# Patient Record
Sex: Female | Born: 1990 | Race: White | Hispanic: No | Marital: Married | Smoking: Never smoker
Health system: Southern US, Community
[De-identification: ages and names within clinical notes are randomized; demographics above are authoritative.]

## PROBLEM LIST (undated history)

## (undated) DIAGNOSIS — T7840XA Allergy, unspecified, initial encounter: Secondary | ICD-10-CM

## (undated) DIAGNOSIS — J309 Allergic rhinitis, unspecified: Secondary | ICD-10-CM

## (undated) DIAGNOSIS — C539 Malignant neoplasm of cervix uteri, unspecified: Secondary | ICD-10-CM

## (undated) DIAGNOSIS — F419 Anxiety disorder, unspecified: Secondary | ICD-10-CM

## (undated) HISTORY — DX: Anxiety disorder, unspecified: F41.9

## (undated) HISTORY — DX: Allergy, unspecified, initial encounter: T78.40XA

## (undated) HISTORY — PX: WISDOM TOOTH EXTRACTION: SHX21

## (undated) HISTORY — DX: Malignant neoplasm of cervix uteri, unspecified: C53.9

## (undated) HISTORY — PX: TONSILLECTOMY: SUR1361

## (undated) HISTORY — PX: TONSILECTOMY, ADENOIDECTOMY, BILATERAL MYRINGOTOMY AND TUBES: SHX2538

---
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
## 2014-07-29 ENCOUNTER — Ambulatory Visit
Admission: EM | Admit: 2014-07-29 | Discharge: 2014-07-29 | Disposition: A | Discharge status: Home / Self Care | Payer: Self-pay | Attending: Family Medicine | Admitting: Family Medicine

## 2014-07-29 DIAGNOSIS — L259 Unspecified contact dermatitis, unspecified cause: Secondary | ICD-10-CM

## 2014-07-29 MED ORDER — CETIRIZINE HCL 10 MG PO TABS: 10.0000 mg | ORAL_TABLET | Freq: Every day | ORAL | Status: DC

## 2014-07-29 MED ORDER — PREDNISONE 5 MG PO TABS: 5.0000 mg | ORAL_TABLET | Freq: Every day | ORAL | Status: DC

## 2014-07-29 MED ORDER — RANITIDINE HCL 150 MG PO TABS: 150.0000 mg | ORAL_TABLET | Freq: Two times a day (BID) | ORAL | Status: DC

## 2014-07-29 MED ORDER — PREDNISONE 20 MG PO TABS: 40.0000 mg | ORAL_TABLET | Freq: Every day | ORAL | Status: DC

## 2014-07-29 NOTE — ED Provider Notes (Signed)
CSN: [id number]     Arrival date & time 07/29/14  0902 History   First MD Initiated Contact with Patient 07/29/14 0913     Chief Complaint  Patient presents with  . Pruritis   (Consider location/radiation/quality/duration/timing/severity/associated sxs/prior Treatment) HPI Comments: Caucasian female with history of sensitive skin to perfumes/cleansers.  Washing machine broke last week had to use laundrymat until able to buy new washer.  Continued use of her usual products fragrance free but having body wide itching.  Pets at home denied fleas-bets on treatment, denied insect bites, yard word.  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  No itching relief with benadryl OTC, showering, moisturizing lotion.  The history is provided by the patient and a relative.    History reviewed. No pertinent past medical history. History reviewed. No pertinent past surgical history. History reviewed. No pertinent family history. History  Substance Use Topics  . Smoking status: Never Smoker   . Smokeless tobacco: Never Used  . Alcohol Use: Yes     Comment: occasional   OB History    No data available     Review of Systems  Constitutional: Negative for fever, chills, diaphoresis, activity change, appetite change and fatigue.  HENT: Negative for facial swelling, mouth sores, nosebleeds, trouble swallowing and voice change.   Eyes: Negative for photophobia, pain, discharge, redness and itching.  Respiratory: Negative for apnea, cough, choking, chest tightness, shortness of breath, wheezing and stridor.   Cardiovascular: Negative for chest pain, palpitations and leg swelling.  Gastrointestinal: Negative for nausea, vomiting, diarrhea and constipation.  Endocrine: Negative for cold intolerance and heat intolerance.  Genitourinary: Negative for frequency, hematuria and difficulty urinating.  Musculoskeletal: Negative for myalgias, back pain, joint swelling, arthralgias, gait problem, neck pain and neck  stiffness.  Skin: Positive for rash. Negative for color change, pallor and wound.  Allergic/Immunologic: Positive for environmental allergies. Negative for food allergies.  Neurological: Negative for dizziness, tremors, seizures, syncope, facial asymmetry, speech difficulty, weakness, light-headedness, numbness and headaches.  Hematological: Negative for adenopathy. Does not bruise/bleed easily.  Psychiatric/Behavioral: Negative for behavioral problems, confusion, sleep disturbance and agitation.    Allergies  Review of patient's allergies indicates no known allergies.  Home Medications   Prior to Admission medications   Medication Sig Start Date End Date Taking? Authorizing Provider  cetirizine (ZYRTEC) 10 MG tablet Take 1 tablet (10 mg total) by mouth daily. 07/29/14   Olen Cordial, NP  predniSONE (DELTASONE) 20 MG tablet Take 2 tablets (40 mg total) by mouth daily. For 7 days then 20mg  (1 tab) po daily x 7 days then 10mg  (1/2 tab) for 4 days 07/29/14   Olen Cordial, NP  predniSONE (DELTASONE) 5 MG tablet Take 1 tablet (5 mg total) by mouth daily with breakfast. 07/29/14   Olen Cordial, NP  ranitidine (ZANTAC) 150 MG tablet Take 1 tablet (150 mg total) by mouth 2 (two) times daily. 07/29/14   Aura Fey Itzamar Traynor, NP   BP 122/75 mmHg  Pulse 60  Temp(Src) 98 F (36.7 C) (Tympanic)  Resp 20  Ht 5\' 6"  (1.676 m)  Wt 170 lb (77.111 kg)  BMI 27.45 kg/m2  SpO2 98% Physical Exam  Constitutional: She is oriented to person, place, and time. Vital signs are normal. She appears well-developed and well-nourished. No distress.  HENT:  Head: Normocephalic and atraumatic.  Right Ear: External ear normal.  Left Ear: External ear normal.  Nose: Nose normal.  Mouth/Throat: Oropharynx is clear and moist. No oropharyngeal  exudate.  Eyes: Conjunctivae, EOM and lids are normal. Pupils are equal, round, and reactive to light. Right eye exhibits no discharge. Left eye exhibits no discharge. No  scleral icterus.  Neck: Trachea normal and normal range of motion. Neck supple. No tracheal deviation present. No thyromegaly present.  Cardiovascular: Normal rate, regular rhythm, normal heart sounds and intact distal pulses.  Exam reveals no gallop and no friction rub.   No murmur heard. Pulmonary/Chest: Effort normal and breath sounds normal. No respiratory distress. She has no wheezes. She has no rales. She exhibits no tenderness.  Abdominal: Soft. Bowel sounds are normal. She exhibits no distension and no mass. There is no tenderness. There is no rebound and no guarding.  Musculoskeletal: Normal range of motion. She exhibits no edema or tenderness.  Lymphadenopathy:    She has no cervical adenopathy.  Neurological: She is alert and oriented to person, place, and time. Coordination normal.  Skin: Skin is warm and dry. Abrasion, lesion and rash noted. No bruising, no burn, no ecchymosis, no laceration, no petechiae and no purpura noted. Rash is papular. Rash is not macular, not maculopapular, not nodular, not pustular, not vesicular and not urticarial. She is not diaphoretic. No cyanosis or erythema. No pallor. Nails show no clubbing.  All extremities and torso with nonerythematous fine papules (milia appearance) pruritis patient continual scratching over clothes or bare skin during H&P  Psychiatric: She has a normal mood and affect. Her speech is normal and behavior is normal. Judgment and thought content normal. Cognition and memory are normal.  Nursing note and vitals reviewed.   ED Course  Procedures (including critical care time) Labs Review Labs Reviewed - No data to display  Imaging Review No results found.   MDM   1. Contact dermatitis    Washer broke and had to use laundry mat this past week bought new washer and resumed home laundry.  Typically sensitive skin uses nonfragrance/sensitive cleansers and personal hygiene products.  Widespread pruritis and nonerythematous papules  1-6mm diameter greater than 20% body surface area arms/legs/torso.  Patient scratching during H&P.  1-2mg /kg Prednisone (max 60mg ) for 7-10 days and taper over next 7-10 days per Up to Date.  Symptomatic therapy suggested e.g. Calamine lotion, benadryl or OTC zyrtec.  Warm to cool water soaks and/or oatmeal baths.  Call or return to clinic as needed if these symptoms worsen or fail to improve as anticipated especially lesions noted on eye, visual changes or visual loss.  Avoid scratching lesions to prevent secondary infections.  May apply ice to itchy areas if po/topical meds not yet active systemically or wearing off prior to next dose.  Exitcare handout on contact dermatitis given to patient and mother.  Patient and mother verbalized agreement and understanding of treatment plan and had no further questions at this time.   P2:  Avoidance and hand washing.    Olen Cordial, NP 07/29/14 1824

## 2014-07-29 NOTE — ED Notes (Signed)
Patient reports itching to arms, legs for the past week, no real rash noted. Tried OTC benadryl to help but no relief. Has tried to change soaps and detergents.

## 2014-07-29 NOTE — Discharge Instructions (Signed)

## 2014-08-15 ENCOUNTER — Emergency Department
Admission: EM | Admit: 2014-08-15 | Discharge: 2014-08-15 | Disposition: A | Discharge status: Home / Self Care | Payer: Self-pay | Attending: Emergency Medicine | Admitting: Emergency Medicine

## 2014-08-15 ENCOUNTER — Encounter: Payer: Self-pay | Admitting: Emergency Medicine

## 2014-08-15 DIAGNOSIS — L299 Pruritus, unspecified: Secondary | ICD-10-CM | POA: Insufficient documentation

## 2014-08-15 DIAGNOSIS — Z7952 Long term (current) use of systemic steroids: Secondary | ICD-10-CM | POA: Insufficient documentation

## 2014-08-15 DIAGNOSIS — Z79899 Other long term (current) drug therapy: Secondary | ICD-10-CM | POA: Insufficient documentation

## 2014-08-15 MED ORDER — HYDROXYZINE HCL 50 MG PO TABS
ORAL_TABLET | ORAL | Status: AC
  Administered 2014-08-15: 50 mg via ORAL
  Filled 2014-08-15: qty 1

## 2014-08-15 MED ORDER — HYDROXYZINE HCL 50 MG PO TABS
50.0000 mg | ORAL_TABLET | Freq: Once | ORAL | Status: AC
  Administered 2014-08-15: 50 mg via ORAL

## 2014-08-15 MED ORDER — HYDROXYZINE HCL 50 MG PO TABS: 50.0000 mg | ORAL_TABLET | Freq: Three times a day (TID) | ORAL | Status: DC | PRN

## 2014-08-15 NOTE — ED Notes (Signed)
Patient presents to the ED with itching x 2 weeks to legs, chest and back.  Patient seen at urgent care and has been taking prednisone for several days with minimal relief.  Patient reports no noticeable rash.  Patient states she changed body wash for a short time but states she changed back about 1 week ago.

## 2014-08-15 NOTE — ED Provider Notes (Signed)
[facility] Emergency Department Provider Note  ____________________________________________  Time seen: Approximately 12:35 PM  I have reviewed the triage vital signs and the nursing notes.   HISTORY  Chief Complaint Pruritis    HPI Dawn Camacho is a 24 y.o. female complaining of itching without a rash for 2 weeks. Patient has a generalized body itching for 2 weeks. Patient was seen at urgent care clinic a week ago and was given prednisone which state helps was still continues  with itching. She states she is change all provocative items personal hygiene products  to find the source of the itching.Patient denies any pain.   History reviewed. No pertinent past medical history.  There are no active problems to display for this patient.   History reviewed. No pertinent past surgical history.  Current Outpatient Rx  Name  Route  Sig  Dispense  Refill  . cetirizine (ZYRTEC) 10 MG tablet   Oral   Take 1 tablet (10 mg total) by mouth daily.   14 tablet      . hydrOXYzine (ATARAX/VISTARIL) 50 MG tablet   Oral   Take 1 tablet (50 mg total) by mouth 3 (three) times daily as needed.   30 tablet   0   . predniSONE (DELTASONE) 20 MG tablet   Oral   Take 2 tablets (40 mg total) by mouth daily. For 7 days then 20mg  (1 tab) po daily x 7 days then 10mg  (1/2 tab) for 4 days   23 tablet   0   . predniSONE (DELTASONE) 5 MG tablet   Oral   Take 1 tablet (5 mg total) by mouth daily with breakfast.   3 tablet   0   . ranitidine (ZANTAC) 150 MG tablet   Oral   Take 1 tablet (150 mg total) by mouth 2 (two) times daily.   28 tablet   0     Allergies Review of patient's allergies indicates no known allergies.  History reviewed. No pertinent family history.  Social History History  Substance Use Topics  . Smoking status: Never Smoker   . Smokeless tobacco: Never Used  . Alcohol Use: Yes     Comment: occasional    Review of  Systems Constitutional: No fever/chills Eyes: No visual changes. ENT: No sore throat. Cardiovascular: Denies chest pain. Respiratory: Denies shortness of breath. Gastrointestinal: No abdominal pain.  No nausea, no vomiting.  No diarrhea.  No constipation. Genitourinary: Negative for dysuria. Musculoskeletal: Negative for back pain. Skin: Negative for rash. Neurological: Negative for headaches, focal weakness or numbness. Endocrine: Hematological/Lymphatic: 10-point ROS otherwise negative.  ____________________________________________   PHYSICAL EXAM:  VITAL SIGNS: ED Triage Vitals  Enc Vitals Group     BP 08/15/14 1205 131/84 mmHg     Pulse Rate 08/15/14 1205 63     Resp 08/15/14 1205 20     Temp 08/15/14 1205 98.4 F (36.9 C)     Temp Source 08/15/14 1205 Oral     SpO2 08/15/14 1205 98 %     Weight 08/15/14 1205 180 lb (81.647 kg)     Height 08/15/14 1205 5\' 6"  (1.676 m)     Head Cir --      Peak Flow --      Pain Score 08/15/14 1205 0     Pain Loc --      Pain Edu? --      Excl. in [place]? --     Constitutional: Alert and oriented. Well appearing and  in no acute distress. Eyes: Conjunctivae are normal. PERRL. EOMI. Head: Atraumatic. Nose: No congestion/rhinnorhea. Mouth/Throat: Mucous membranes are moist.  Oropharynx non-erythematous. Neck: No stridor.  Hematological/Lymphatic/Immunilogical: No cervical lymphadenopathy. Cardiovascular: Normal rate, regular rhythm. Grossly normal heart sounds.  Good peripheral circulation. Respiratory: Normal respiratory effort.  No retractions. Lungs CTAB. Gastrointestinal: Soft and nontender. No distention. No abdominal bruits. No CVA tenderness. Genitourinary:  Musculoskeletal: No lower extremity tenderness nor edema.  No joint effusions. Neurologic:  Normal speech and language. No gross focal neurologic deficits are appreciated. Speech is normal. No gait instability. Skin:  Skin is warm, dry and intact. No rash  noted. Psychiatric: Mood and affect are normal. Speech and behavior are normal.  ____________________________________________   LABS (all labs ordered are listed, but only abnormal results are displayed)  Labs Reviewed - No data to display ____________________________________________  EKG   ____________________________________________  RADIOLOGY   ____________________________________________   PROCEDURES  Procedure(s) performed: None  Critical Care performed: No  ____________________________________________   INITIAL IMPRESSION / ASSESSMENT AND PLAN / ED COURSE  Pertinent labs & imaging results that were available during my care of the patient were reviewed by me and considered in my medical decision making (see chart for details).  Pruritus. Advised patient to continue prednisone as directed. Start Atarax 50 mg 3 times a day when necessary itching. Patient advised to follow-up with dermatology for definitive evaluation and treatment. ____________________________________________   FINAL CLINICAL IMPRESSION(S) / ED DIAGNOSES  Final diagnoses:  Chronic pruritus      Sable Feil, PA-C 08/15/14 [place], PA-C 08/15/14 1254  Ahmed Prima, MD 08/15/14 [phone]

## 2014-08-22 ENCOUNTER — Ambulatory Visit
Admission: EM | Admit: 2014-08-22 | Discharge: 2014-08-22 | Disposition: A | Discharge status: Home / Self Care | Payer: Self-pay | Attending: Internal Medicine | Admitting: Internal Medicine

## 2014-08-22 DIAGNOSIS — B86 Scabies: Secondary | ICD-10-CM

## 2014-08-22 MED ORDER — PERMETHRIN 5 % EX CREA: TOPICAL_CREAM | CUTANEOUS | Status: DC

## 2014-08-22 MED ORDER — PREDNISONE 20 MG PO TABS: 60.0000 mg | ORAL_TABLET | Freq: Every day | ORAL | Status: DC

## 2014-08-22 MED ORDER — PREDNISONE 10 MG PO TABS: 60.0000 mg | ORAL_TABLET | Freq: Every day | ORAL | Status: DC

## 2014-08-22 NOTE — Discharge Instructions (Signed)
Prescriptions for prednisone and elimite.  Scabies Scabies are small bugs (mites) that burrow under the skin and cause red bumps and severe itching. These bugs can only be seen with a microscope. Scabies are highly contagious. They can spread easily from person to person by direct contact. They are also spread through sharing clothing or linens that have the scabies mites living in them. It is not unusual for an entire family to become infected through shared towels, clothing, or bedding.  HOME CARE INSTRUCTIONS   Your caregiver may prescribe a cream or lotion to kill the mites. If cream is prescribed, massage the cream into the entire body from the neck to the bottom of both feet. Also massage the cream into the scalp and face if your child is less than 71 year old. Avoid the eyes and mouth. Do not wash your hands after application.  Leave the cream on for 8 to 12 hours. Your child should bathe or shower after the 8 to 12 hour application period. Sometimes it is helpful to apply the cream to your child right before bedtime.  One treatment is usually effective and will eliminate approximately 95% of infestations. For severe cases, your caregiver may decide to repeat the treatment in 1 week. Everyone in your household should be treated with one application of the cream.  New rashes or burrows should not appear within 24 to 48 hours after successful treatment. However, the itching and rash may last for 2 to 4 weeks after successful treatment. Your caregiver may prescribe a medicine to help with the itching or to help the rash go away more quickly.  Scabies can live on clothing or linens for up to 3 days. All of your child's recently used clothing, towels, stuffed toys, and bed linens should be washed in hot water and then dried in a dryer for at least 20 minutes on high heat. Items that cannot be washed should be enclosed in a plastic bag for at least 3 days.  To help relieve itching, bathe your child  in a cool bath or apply cool washcloths to the affected areas.  Your child may return to school after treatment with the prescribed cream. SEEK MEDICAL CARE IF:   The itching persists longer than 4 weeks after treatment.  The rash spreads or becomes infected. Signs of infection include red blisters or yellow-tan crust. Document Released: 02/09/2005 Document Revised: 05/04/2011 Document Reviewed: 06/20/2008 [facility] Patient Information 2015 [place], [place]. This information is not intended to replace advice given to you by your health care provider. Make sure you discuss any questions you have with your health care provider.

## 2014-08-22 NOTE — ED Notes (Signed)
Pt states "I was seen here a few weeks ago with itching, and it continues. I completed the Prednisone course and was getting better, but now the itching is worse again."

## 2014-08-23 NOTE — ED Provider Notes (Signed)
CSN: [id number]     Arrival date & time 08/22/14  1827 History   First MD Initiated Contact with Patient 08/22/14 1929     Chief Complaint  Patient presents with  . Pruritis   HPI   Severe itching, with nondescript papular rash.  Very itchy in waistline, in groins, under buttocks, at wrists, but whole body itches.  Has tried changing laundry products, personal care products, but no relief. No itchy contacts.  No prior hx this symptom.  History reviewed. No pertinent past medical history. History reviewed. No pertinent past surgical history. No family history on file. History  Substance Use Topics  . Smoking status: Never Smoker   . Smokeless tobacco: Never Used  . Alcohol Use: Yes     Comment: occasional   OB History    No data available     Review of Systems  All other systems reviewed and are negative.   Allergies  Review of patient's allergies indicates no known allergies.  Home Medications patient takes no medications regularly.  Prednisone and hydroxyzine prescribed recently helped some with the itching.    BP 117/66 mmHg  Pulse 70  Temp(Src) 97.4 F (36.3 C) (Tympanic)  Ht 5\' 6"  (1.676 m)  Wt 180 lb (81.647 kg)  BMI 29.07 kg/m2  SpO2 100%  LMP 08/02/2014 (Exact Date) Physical Exam  Constitutional: She is oriented to person, place, and time. No distress.  Alert, nicely groomed  HENT:  Head: Atraumatic.  Eyes:  Conjugate gaze, no eye redness/drainage  Neck: Neck supple.  Cardiovascular: Normal rate.   Pulmonary/Chest: No respiratory distress.  Abdominal: She exhibits no distension.  Musculoskeletal: Normal range of motion.  No leg swelling  Neurological: She is alert and oriented to person, place, and time.  Skin: Skin is warm and dry.  No cyanosis Scattered red papules over arms/legs/torso.  A few excoriations from scratching.    Nursing note and vitals reviewed.   ED Course  Procedures  No procedures at the urgent care today.  MDM   1.  Scabies    rx elimite lotion and prednisone taper.  Discussed environmental measures, wash in hot water or put away for several days clothing/bedding/towels.  Anticipate slow improvement in itching over the next several days to weeks.  Recheck as needed.    Sherlene Shams, MD 08/23/14 2236

## 2015-01-27 ENCOUNTER — Ambulatory Visit
Admission: EM | Admit: 2015-01-27 | Discharge: 2015-01-27 | Disposition: A | Discharge status: Home / Self Care | Payer: Self-pay | Attending: Internal Medicine | Admitting: Internal Medicine

## 2015-01-27 DIAGNOSIS — H6502 Acute serous otitis media, left ear: Secondary | ICD-10-CM

## 2015-01-27 DIAGNOSIS — J0100 Acute maxillary sinusitis, unspecified: Secondary | ICD-10-CM

## 2015-01-27 MED ORDER — LORATADINE-PSEUDOEPHEDRINE ER 5-120 MG PO TB12: 1.0000 | ORAL_TABLET | Freq: Two times a day (BID) | ORAL | Status: AC

## 2015-01-27 MED ORDER — AMOXICILLIN-POT CLAVULANATE 875-125 MG PO TABS: 1.0000 | ORAL_TABLET | Freq: Two times a day (BID) | ORAL | Status: DC

## 2015-01-27 NOTE — ED Notes (Signed)
X "couple days". Facial pain, H/A, nasal congestion, left ear fullness, decreased hearing in left ear.

## 2015-01-27 NOTE — Discharge Instructions (Signed)
Take medication as prescribed. Rest. Drink and eat regularly.   Follow up with your primary care physician this week as needed. Return to Urgent care as needed for new or worsening concerns.   Otitis Media With Effusion Otitis media with effusion is the presence of fluid in the middle ear. This is a common problem in children, which often follows ear infections. It may be present for weeks or longer after the infection. Unlike an acute ear infection, otitis media with effusion refers only to fluid behind the ear drum and not infection. Children with repeated ear and sinus infections and allergy problems are the most likely to get otitis media with effusion. CAUSES  The most frequent cause of the fluid buildup is dysfunction of the eustachian tubes. These are the tubes that drain fluid in the ears to the back of the nose (nasopharynx). SYMPTOMS   The main symptom of this condition is hearing loss. As a result, you or your child may:  Listen to the TV at a loud volume.  Not respond to questions.  Ask "what" often when spoken to.  Mistake or confuse one sound or word for another.  There may be a sensation of fullness or pressure but usually not pain. DIAGNOSIS   Your health care provider will diagnose this condition by examining you or your child's ears.  Your health care provider may test the pressure in you or your child's ear with a tympanometer.  A hearing test may be conducted if the problem persists. TREATMENT   Treatment depends on the duration and the effects of the effusion.  Antibiotics, decongestants, nose drops, and cortisone-type drugs (tablets or nasal spray) may not be helpful.  Children with persistent ear effusions may have delayed language or behavioral problems. Children at risk for developmental delays in hearing, learning, and speech may require referral to a specialist earlier than children not at risk.  You or your child's health care provider may suggest a  referral to an ear, nose, and throat surgeon for treatment. The following may help restore normal hearing:  Drainage of fluid.  Placement of ear tubes (tympanostomy tubes).  Removal of adenoids (adenoidectomy). HOME CARE INSTRUCTIONS   Avoid secondhand smoke.  Infants who are breastfed are less likely to have this condition.  Avoid feeding infants while they are lying flat.  Avoid known environmental allergens.  Avoid people who are sick. SEEK MEDICAL CARE IF:   Hearing is not better in 3 months.  Hearing is worse.  Ear pain.  Drainage from the ear.  Dizziness. MAKE SURE YOU:   Understand these instructions.  Will watch your condition.  Will get help right away if you are not doing well or get worse.   This information is not intended to replace advice given to you by your health care provider. Make sure you discuss any questions you have with your health care provider.   Document Released: 03/19/2004 Document Revised: 03/02/2014 Document Reviewed: 09/06/2012 Elsevier Interactive Patient Education 2016 Elsevier [employer]  Sinusitis, Adult Sinusitis is redness, soreness, and inflammation of the paranasal sinuses. Paranasal sinuses are air pockets within the bones of your face. They are located beneath your eyes, in the middle of your forehead, and above your eyes. In healthy paranasal sinuses, mucus is able to drain out, and air is able to circulate through them by way of your nose. However, when your paranasal sinuses are inflamed, mucus and air can become trapped. This can allow bacteria and other germs to grow  and cause infection. Sinusitis can develop quickly and last only a short time (acute) or continue over a long period (chronic). Sinusitis that lasts for more than 12 weeks is considered chronic. CAUSES Causes of sinusitis include:  Allergies.  Structural abnormalities, such as displacement of the cartilage that separates your nostrils (deviated septum), which can  decrease the air flow through your nose and sinuses and affect sinus drainage.  Functional abnormalities, such as when the small hairs (cilia) that line your sinuses and help remove mucus do not work properly or are not present. SIGNS AND SYMPTOMS Symptoms of acute and chronic sinusitis are the same. The primary symptoms are pain and pressure around the affected sinuses. Other symptoms include:  Upper toothache.  Earache.  Headache.  Bad breath.  Decreased sense of smell and taste.  A cough, which worsens when you are lying flat.  Fatigue.  Fever.  Thick drainage from your nose, which often is green and may contain pus (purulent).  Swelling and warmth over the affected sinuses. DIAGNOSIS Your health care provider will perform a physical exam. During your exam, your health care provider may perform any of the following to help determine if you have acute sinusitis or chronic sinusitis:  Look in your nose for signs of abnormal growths in your nostrils (nasal polyps).  Tap over the affected sinus to check for signs of infection.  View the inside of your sinuses using an imaging device that has a light attached (endoscope). If your health care provider suspects that you have chronic sinusitis, one or more of the following tests may be recommended:  Allergy tests.  Nasal culture. A sample of mucus is taken from your nose, sent to a lab, and screened for bacteria.  Nasal cytology. A sample of mucus is taken from your nose and examined by your health care provider to determine if your sinusitis is related to an allergy. TREATMENT Most cases of acute sinusitis are related to a viral infection and will resolve on their own within 10 days. Sometimes, medicines are prescribed to help relieve symptoms of both acute and chronic sinusitis. These may include pain medicines, decongestants, nasal steroid sprays, or saline sprays. However, for sinusitis related to a bacterial infection, your  health care provider will prescribe antibiotic medicines. These are medicines that will help kill the bacteria causing the infection. Rarely, sinusitis is caused by a fungal infection. In these cases, your health care provider will prescribe antifungal medicine. For some cases of chronic sinusitis, surgery is needed. Generally, these are cases in which sinusitis recurs more than 3 times per year, despite other treatments. HOME CARE INSTRUCTIONS  Drink plenty of water. Water helps thin the mucus so your sinuses can drain more easily.  Use a humidifier.  Inhale steam 3-4 times a day (for example, sit in the bathroom with the shower running).  Apply a warm, moist washcloth to your face 3-4 times a day, or as directed by your health care provider.  Use saline nasal sprays to help moisten and clean your sinuses.  Take medicines only as directed by your health care provider.  If you were prescribed either an antibiotic or antifungal medicine, finish it all even if you start to feel better. SEEK IMMEDIATE MEDICAL CARE IF:  You have increasing pain or severe headaches.  You have nausea, vomiting, or drowsiness.  You have swelling around your face.  You have vision problems.  You have a stiff neck.  You have difficulty breathing.   This  information is not intended to replace advice given to you by your health care provider. Make sure you discuss any questions you have with your health care provider.   Document Released: 02/09/2005 Document Revised: 03/02/2014 Document Reviewed: 02/24/2011 Elsevier Interactive Patient Education [employer].

## 2015-01-27 NOTE — ED Provider Notes (Signed)
[facility]  ____________________________________________  Time seen: Approximately 2:28 PM  I have reviewed the triage vital signs and the nursing notes.   HISTORY  Chief Complaint Sinusitis   HPI Dawn Camacho is a 24 y.o. female  presents for the complaint of 7-8 days of runny nose, nasal congestion, sinus pressure. Also reports gradual onset of left ear discomfort. States left ear feels clogged. States sinus pressure discomfort is 4 out of 10 denies pain radiation. Denies neck pain, headache, dizziness, nausea, vomiting, chest pain or shortness of breath. Denies abdominal pain or fevers. Reports continues to eat and drink well.  Reports she has had back-to-back "colds" over the last few weeks. States nasal congestion never fully resolved. Reports frequently has thick yellowish drainage from nose and blowing nose. States left ear feels clogged and somewhat muffled but denies hearing changes.   No past medical history on file.  There are no active problems to display for this patient.   No past surgical history on file.  LMP: 2 days ago, denies chance of pregnancy.   Current Outpatient Rx  Name  Route  Sig  Dispense  Refill  .           .           .           .             Allergies Review of patient's allergies indicates no known allergies.  No family history on file.  Social History Social History  Substance Use Topics  . Smoking status: Never Smoker   . Smokeless tobacco: Never Used  . Alcohol Use: Yes     Comment: occasional    Review of Systems Constitutional: No fever/chills Eyes: No visual changes. ENT: No sore throat. positive runny nose, nasal congestion, sinus drainage. Reports occasional cough.  Cardiovascular: Denies chest pain. Respiratory: Denies shortness of breath. Gastrointestinal: No abdominal pain.  No nausea, no vomiting.  No diarrhea.  No constipation. Genitourinary: Negative for dysuria. Musculoskeletal: Negative for  back pain. Skin: Negative for rash. Neurological: Negative for headaches, focal weakness or numbness.  10-point ROS otherwise negative.  ____________________________________________   PHYSICAL EXAM:  VITAL SIGNS: ED Triage Vitals  Enc Vitals Group     BP 01/27/15 1400 124/63 mmHg     Pulse Rate 01/27/15 1400 69     Resp 01/27/15 1400 16     Temp 01/27/15 1400 98 F (36.7 C)     Temp Source 01/27/15 1400 Oral     SpO2 01/27/15 1400 100 %     Weight 01/27/15 1400 189 lb (85.73 kg)     Height 01/27/15 1400 5\' 6"  (1.676 m)     Head Cir --      Peak Flow --      Pain Score 01/27/15 1402 4     Pain Loc --      Pain Edu? --      Excl. in [place]? --     Constitutional: Alert and oriented. Well appearing and in no acute distress. Eyes: Conjunctivae are normal. PERRL. EOMI. Head: Atraumatic.Moderate tenderness to palpation bilateral maxillary sinuses. Minimal tenderness to palpation bilateral frontal sinuses. No swelling. No erythema.   Ears:  right : No erythema, normal TM. Left : Mild erythema with positive air-fluid levels seen TM , TM intact , no exudate or drainage. Minimal tenderness to palpation. Hearing grossly intact bilaterally.   Nose:  nasal congestion bilateral nasal turbinate erythema  and edema. ,   Mouth/Throat: Mucous membranes are moist. Minimal pharyngeal erythema. No tonsillar swelling or exudate. Neck: No stridor.  No cervical spine tenderness to palpation. Hematological/Lymphatic/Immunilogical: No cervical lymphadenopathy. Cardiovascular: Normal rate, regular rhythm. Grossly normal heart sounds.  Good peripheral circulation. Respiratory: Normal respiratory effort.  No retractions. Lungs CTAB. Gastrointestinal: Soft and nontender. No distention. Normal Bowel sounds. Musculoskeletal: No lower or upper extremity tenderness nor edema.  No joint effusions. Bilateral pedal pulses equal and easily palpated.  Neurologic:  Normal speech and language. No gross focal neurologic  deficits are appreciated. No gait instability. Skin:  Skin is warm, dry and intact. No rash noted. Psychiatric: Mood and affect are normal. Speech and behavior are normal.  ____________________________________________   LABS (all labs ordered are listed, but only abnormal results are displayed)  Labs Reviewed - No data to display  INITIAL IMPRESSION / ASSESSMENT AND PLAN / ED COURSE  Pertinent labs & imaging results that were available during my care of the patient were reviewed by me and considered in my medical decision making (see chart for details).  Very well-appearing patient. No acute distress. Presents for complaints of 1 week of runny nose, nasal congestion, nasal drainage and sinus pressure. Also reports times few days of left ear discomfort and feeling like left ear is clogged. Lungs clear throughout. Very well-appearing patient. Suspect maxillary sinusitis with left otitis media with effusion. Encouraged rest, fluids over-the-counter, or ibuprofen as needed. Encouraged Claritin-D and will treat with oral Augmentin.  Discussed follow up with Primary care physician this week. Discussed follow up and return parameters including no resolution or any worsening concerns. Patient verbalized understanding and agreed to plan.   ____________________________________________   FINAL CLINICAL IMPRESSION(S) / ED DIAGNOSES  Final diagnoses:  Acute maxillary sinusitis, recurrence not specified  Acute serous otitis media of left ear, recurrence not specified       Marylene Land, NP 01/27/15 [phone]

## 2015-11-21 ENCOUNTER — Ambulatory Visit
Admission: EM | Admit: 2015-11-21 | Discharge: 2015-11-21 | Disposition: A | Discharge status: Home / Self Care | Payer: Self-pay | Attending: Family Medicine | Admitting: Family Medicine

## 2015-11-21 DIAGNOSIS — L301 Dyshidrosis [pompholyx]: Secondary | ICD-10-CM | POA: Diagnosis present

## 2015-11-21 HISTORY — DX: Allergic rhinitis, unspecified: J30.9

## 2015-11-21 MED ORDER — CLOBETASOL PROPIONATE 0.05 % EX CREA: 1.0000 "application " | TOPICAL_CREAM | Freq: Two times a day (BID) | CUTANEOUS | 0 refills | Status: DC

## 2015-11-21 NOTE — ED Provider Notes (Signed)
[facility]    CSN: [id number] Arrival date & time: 11/21/15  1210     History   Chief Complaint Chief Complaint  Patient presents with  . Pruritis    HPI Dawn Camacho is a 25 y.o. female.   Patient's here because of rash between her fingers. Back in June she has scabies infection start on the hands that spread all over her body. This time his stress has been on her hands and fingers. This time the rash has not occurred over her arms and legs and other parts of her body. This rash started about 2-3 days ago   The history is provided by the patient. No language interpreter was used.  Rash  Location:  Finger and hand Hand rash location:  L hand, R hand, L fingers and R fingers Quality: itchiness   Severity:  Moderate Onset quality:  Sudden Duration:  3 days Timing:  Constant Progression:  Worsening Chronicity:  New Context: not animal contact, not chemical exposure, not diapers, not eggs, not hot tub use, not insect bite/sting, not medications, not new detergent/soap, not plant contact and not pollen   Relieved by:  Nothing Ineffective treatments:  None tried   Past Medical History:  Diagnosis Date  . Allergic rhinitis     Patient Active Problem List   Diagnosis Date Noted  . Dyshydrosis 11/21/2015    Past Surgical History:  Procedure Laterality Date  . TONSILLECTOMY      OB History    No data available       Home Medications    Prior to Admission medications   Medication Sig Start Date End Date Taking? Authorizing Provider  amoxicillin-clavulanate (AUGMENTIN) 875-125 MG tablet Take 1 tablet by mouth every 12 (twelve) hours. 01/27/15   Marylene Land, NP  cetirizine (ZYRTEC) 10 MG tablet Take 1 tablet (10 mg total) by mouth daily. 07/29/14   Olen Cordial, NP  clobetasol cream (TEMOVATE) AB-123456789 % Apply 1 application topically 2 (two) times daily. 11/21/15   Frederich Cha, MD  hydrOXYzine (ATARAX/VISTARIL) 50 MG tablet Take 1 tablet (50 mg  total) by mouth 3 (three) times daily as needed. 08/15/14   Sable Feil, PA-C  permethrin (ELIMITE) 5 % cream Apply to affected area once from the neck down, rub in thoroughly 08/22/14   Sherlene Shams, MD  predniSONE (DELTASONE) 20 MG tablet Take 3 tablets (60 mg total) by mouth daily. For 3 days, then 2 tablets daily for 3 days then 1 tablet daily for 3 days then 1/2 tablet daily for 4 days. 08/22/14   Sherlene Shams, MD    Family History History reviewed. No pertinent family history.  Social History Social History  Substance Use Topics  . Smoking status: Never Smoker  . Smokeless tobacco: Never Used  . Alcohol use Yes     Comment: occasional     Allergies   Review of patient's allergies indicates no known allergies.   Review of Systems Review of Systems  Skin: Positive for rash.  All other systems reviewed and are negative.    Physical Exam Triage Vital Signs ED Triage Vitals  Enc Vitals Group     BP 11/21/15 1304 122/80     Pulse Rate 11/21/15 1304 69     Resp 11/21/15 1304 20     Temp 11/21/15 1304 98.1 F (36.7 C)     Temp Source 11/21/15 1304 Oral     SpO2 11/21/15 1304 100 %  Weight 11/21/15 1305 190 lb (86.2 kg)     Height 11/21/15 1305 5' 6.5" (1.689 m)     Head Circumference --      Peak Flow --      Pain Score --      Pain Loc --      Pain Edu? --      Excl. in [place]? --    No data found.   Updated Vital Signs BP 122/80 (BP Location: Left Arm)   Pulse 69   Temp 98.1 F (36.7 C) (Oral)   Resp 20   Ht 5' 6.5" (1.689 m)   Wt 190 lb (86.2 kg)   LMP 10/25/2015   SpO2 100%   BMI 30.21 kg/m   Visual Acuity Right Eye Distance:   Left Eye Distance:   Bilateral Distance:    Right Eye Near:   Left Eye Near:    Bilateral Near:     Physical Exam  Constitutional: She appears well-developed and well-nourished.  HENT:  Head: Normocephalic and atraumatic.  Eyes: Pupils are equal, round, and reactive to light.  Neck: Normal range of motion.    Pulmonary/Chest: Effort normal.  Musculoskeletal: Normal range of motion.  Neurological: She is alert.  Skin: Skin is warm. Rash noted.  Rash some of fingers around the size the finger.-like lesions consistent with dyshidrosis and not with recurrent scabies infection  Psychiatric: She has a normal mood and affect.  Vitals reviewed.    UC Treatments / Results  Labs (all labs ordered are listed, but only abnormal results are displayed) Labs Reviewed - No data to display  EKG  EKG Interpretation None       Radiology No results found.  Procedures Procedures (including critical care time)  Medications Ordered in UC Medications - No data to display   Initial Impression / Assessment and Plan / UC Course  I have reviewed the triage vital signs and the nursing notes.  Pertinent labs & imaging results that were available during my care of the patient were reviewed by me and considered in my medical decision making (see chart for details).  Clinical Course   Patient will be treated for dyshidrosis with clobestal cream applied twice a day for the next week follow up here or with her PCP if not better in 1-2 weeks   Final Clinical Impressions(s) / UC Diagnoses   Final diagnoses:  Dyshydrosis    New Prescriptions New Prescriptions   CLOBETASOL CREAM (TEMOVATE) 0.05 %    Apply 1 application topically 2 (two) times daily.     Frederich Cha, MD 11/21/15 [phone]

## 2015-11-21 NOTE — ED Triage Notes (Signed)
Pt reports diagnosed with scabies June 2016. Feels the same itching now only to her hands x past few weeks

## 2016-03-23 ENCOUNTER — Encounter: Payer: Self-pay | Admitting: Emergency Medicine

## 2016-03-23 ENCOUNTER — Ambulatory Visit
Admission: EM | Admit: 2016-03-23 | Discharge: 2016-03-23 | Disposition: A | Discharge status: Home / Self Care | Payer: BLUE CROSS/BLUE SHIELD | Attending: Family Medicine | Admitting: Family Medicine

## 2016-03-23 DIAGNOSIS — J0100 Acute maxillary sinusitis, unspecified: Secondary | ICD-10-CM

## 2016-03-23 MED ORDER — FEXOFENADINE-PSEUDOEPHED ER 60-120 MG PO TB12: 1.0000 | ORAL_TABLET | Freq: Two times a day (BID) | ORAL | 0 refills | Status: DC

## 2016-03-23 MED ORDER — FLUTICASONE PROPIONATE 50 MCG/ACT NA SUSP: 2.0000 | Freq: Every day | NASAL | 0 refills | Status: DC

## 2016-03-23 NOTE — ED Triage Notes (Signed)
Patient c/o sinus congestion and sinus pressure and HAs that started Saturday.  Patient denies fevers.

## 2016-03-23 NOTE — ED Provider Notes (Signed)
CSN: [id number]     Arrival date & time 03/23/16  1123 History   First MD Initiated Contact with Patient 03/23/16 1252     Chief Complaint  Patient presents with  . Facial Pain   (Consider location/radiation/quality/duration/timing/severity/associated sxs/prior Treatment) HPI  26 year old female that presents with sinus congestion sinus pressure and headaches Saturday. She denies any fevers. Most of her  pain is over her maxillary sinuses.       Past Medical History:  Diagnosis Date  . Allergic rhinitis    Past Surgical History:  Procedure Laterality Date  . TONSILLECTOMY     History reviewed. No pertinent family history. Social History  Substance Use Topics  . Smoking status: Never Smoker  . Smokeless tobacco: Never Used  . Alcohol use Yes     Comment: occasional   OB History    No data available     Review of Systems  Constitutional: Positive for activity change. Negative for chills, fatigue and fever.  HENT: Positive for congestion, postnasal drip, rhinorrhea, sinus pain and sinus pressure.   Respiratory: Negative for cough, shortness of breath, wheezing and stridor.   All other systems reviewed and are negative.   Allergies  Patient has no known allergies.  Home Medications   Prior to Admission medications   Medication Sig Start Date End Date Taking? Authorizing Provider  cetirizine (ZYRTEC) 10 MG tablet Take 1 tablet (10 mg total) by mouth daily. 07/29/14   Olen Cordial, NP  clobetasol cream (TEMOVATE) AB-123456789 % Apply 1 application topically 2 (two) times daily. 11/21/15   Frederich Cha, MD  fexofenadine-pseudoephedrine (ALLEGRA-D) 60-120 MG 12 hr tablet Take 1 tablet by mouth every 12 (twelve) hours. 03/23/16   Lorin Picket, PA-C  fluticasone (FLONASE) 50 MCG/ACT nasal spray Place 2 sprays into both nostrils daily. 03/23/16   Lorin Picket, PA-C  hydrOXYzine (ATARAX/VISTARIL) 50 MG tablet Take 1 tablet (50 mg total) by mouth 3 (three) times daily as  needed. 08/15/14   Sable Feil, PA-C   Meds Ordered and Administered this Visit  Medications - No data to display  BP 127/86 (BP Location: Left Arm)   Pulse 64   Temp 98.5 F (36.9 C) (Oral)   Resp 16   Ht 5\' 6"  (1.676 m)   Wt 190 lb (86.2 kg)   LMP 03/10/2016 (Exact Date)   SpO2 100%   BMI 30.67 kg/m  No data found.   Physical Exam  Constitutional: She is oriented to person, place, and time. She appears well-developed and well-nourished. No distress.  HENT:  Head: Normocephalic and atraumatic.  Right Ear: External ear normal.  Left Ear: External ear normal.  Nose: Nose normal.  Mouth/Throat: Oropharynx is clear and moist. No oropharyngeal exudate.   tenderness to percussion over the maxillary sinuses  Eyes: EOM are normal. Pupils are equal, round, and reactive to light. Right eye exhibits no discharge. Left eye exhibits no discharge.  Neck: Normal range of motion. Neck supple.  Pulmonary/Chest: Effort normal and breath sounds normal. No respiratory distress. She has no wheezes. She has no rales.  Musculoskeletal: Normal range of motion.  Lymphadenopathy:    She has no cervical adenopathy.  Neurological: She is alert and oriented to person, place, and time.  Skin: Skin is warm and dry. She is not diaphoretic.  Psychiatric: She has a normal mood and affect. Her behavior is normal. Judgment and thought content normal.  Nursing note and vitals reviewed.   Urgent Care Course  Procedures (including critical care time)  Labs Review Labs Reviewed - No data to display  Imaging Review No results found.   Visual Acuity Review  Right Eye Distance:   Left Eye Distance:   Bilateral Distance:    Right Eye Near:   Left Eye Near:    Bilateral Near:         MDM   1. Subacute maxillary sinusitis    Discharge Medication List as of 03/23/2016  1:04 PM    START taking these medications   Details  fexofenadine-pseudoephedrine (ALLEGRA-D) 60-120 MG 12 hr tablet  Take 1 tablet by mouth every 12 (twelve) hours., Starting Mon 03/23/2016, Normal    fluticasone (FLONASE) 50 MCG/ACT nasal spray Place 2 sprays into both nostrils daily., Starting Mon 03/23/2016, Normal      Plan: 1. Test/x-ray results and diagnosis reviewed with patient 2. rx as per orders; risks, benefits, potential side effects reviewed with patient 3. Recommend supportive treatment with Flonase for 3 weeks. Asked her to consider using an Netty pot in the morning blow her nose and then use the Flonase. I told patient that this is a early sinusitis and does not require antibiotic use. She's continues to have symptoms after 2 weeks she should return to the clinic for possible antibiotic use. 4. F/u prn if symptoms worsen or don't improve     Lorin Picket, PA-C 03/23/16 2122

## 2017-05-13 ENCOUNTER — Encounter: Payer: Self-pay | Admitting: *Deleted

## 2017-05-13 ENCOUNTER — Ambulatory Visit
Admission: EM | Admit: 2017-05-13 | Discharge: 2017-05-13 | Disposition: A | Discharge status: Home / Self Care | Payer: BLUE CROSS/BLUE SHIELD | Attending: Family Medicine | Admitting: Family Medicine

## 2017-05-13 DIAGNOSIS — R05 Cough: Secondary | ICD-10-CM | POA: Diagnosis not present

## 2017-05-13 DIAGNOSIS — J0100 Acute maxillary sinusitis, unspecified: Secondary | ICD-10-CM

## 2017-05-13 MED ORDER — HYDROCODONE-HOMATROPINE 5-1.5 MG/5ML PO SYRP: 5.0000 mL | ORAL_SOLUTION | Freq: Four times a day (QID) | ORAL | 0 refills | Status: DC | PRN

## 2017-05-13 MED ORDER — AMOXICILLIN-POT CLAVULANATE 875-125 MG PO TABS: 1.0000 | ORAL_TABLET | Freq: Two times a day (BID) | ORAL | 0 refills | Status: DC

## 2017-05-13 NOTE — ED Triage Notes (Signed)
C/O productive green cough with nasal congestion x 3 days.  No fever reported or any other symptoms.

## 2017-05-13 NOTE — ED Provider Notes (Signed)
[facility]    CSN: [id number] Arrival date & time: 05/13/17  1816  History   Chief Complaint Chief Complaint  Patient presents with  . Nasal Congestion   HPI  27 year old female presents with concerns for sinusitis.  Patient states that she is been sick for the past few weeks.  She says sinus pressure and congestion particularly in the maxillary region.  She has now developed a severe cough.  She reports that is productive of discolored sputum.  No fever.  She is been taking over-the-counter sinus medication with some improvement.  No known exacerbating factors.  No other associated symptoms.  No other complaints.  Social History Social History   Tobacco Use  . Smoking status: Never Smoker  . Smokeless tobacco: Never Used  Substance Use Topics  . Alcohol use: Yes    Comment: occasional  . Drug use: No   Allergies   Patient has no known allergies.   Review of Systems Review of Systems  Constitutional: Negative for fever.  HENT: Positive for congestion, sinus pressure and sinus pain.   Respiratory: Positive for cough.    Physical Exam Triage Vital Signs ED Triage Vitals  Enc Vitals Group     BP 05/13/17 1825 131/83     Pulse Rate 05/13/17 1825 80     Resp 05/13/17 1825 16     Temp 05/13/17 1825 98.7 F (37.1 C)     Temp Source 05/13/17 1825 Oral     SpO2 05/13/17 1825 100 %     Weight 05/13/17 1822 200 lb (90.7 kg)     Height 05/13/17 1822 5\' 6"  (1.676 m)     Head Circumference --      Peak Flow --      Pain Score 05/13/17 1822 0     Pain Loc --      Pain Edu? --      Excl. in [place]? --    Updated Vital Signs BP 131/83 (BP Location: Left Arm)   Pulse 80   Temp 98.7 F (37.1 C) (Oral)   Resp 16   Ht 5\' 6"  (1.676 m)   Wt 200 lb (90.7 kg)   LMP 04/21/2017   SpO2 100%   BMI 32.28 kg/m     Physical Exam  Constitutional: She is oriented to person, place, and time. She appears well-developed. No distress.  HENT:  Head: Normocephalic and  atraumatic.  Mouth/Throat: Oropharynx is clear and moist.  Severe maxillary sinus tenderness to palpation.  Cardiovascular: Normal rate and regular rhythm.  Pulmonary/Chest: Effort normal and breath sounds normal. She has no wheezes. She has no rales.  Neurological: She is alert and oriented to person, place, and time.  Psychiatric: She has a normal mood and affect. Her behavior is normal.  Nursing note and vitals reviewed.  UC Treatments / Results  Labs (all labs ordered are listed, but only abnormal results are displayed) Labs Reviewed - No data to display  EKG  EKG Interpretation None       Radiology No results found.  Procedures Procedures (including critical care time)  Medications Ordered in UC Medications - No data to display   Initial Impression / Assessment and Plan / UC Course  I have reviewed the triage vital signs and the nursing notes.  Pertinent labs & imaging results that were available during my care of the patient were reviewed by me and considered in my medical decision making (see chart for details).     27 year old  female presents with sinusitis.  Treated with Augmentin.  Hycodan for cough.  Final Clinical Impressions(s) / UC Diagnoses   Final diagnoses:  Acute non-recurrent maxillary sinusitis    ED Discharge Orders        Ordered    amoxicillin-clavulanate (AUGMENTIN) 875-125 MG tablet  Every 12 hours     05/13/17 1859    HYDROcodone-homatropine (HYCODAN) 5-1.5 MG/5ML syrup  Every 6 hours PRN     05/13/17 1859     Controlled Substance Prescriptions [place] Controlled Substance Registry consulted? Not Applicable   Coral Spikes, DO 05/13/17 1952

## 2019-07-24 ENCOUNTER — Ambulatory Visit
Admission: EM | Admit: 2019-07-24 | Discharge: 2019-07-24 | Disposition: A | Discharge status: Home / Self Care | Payer: BC Managed Care – PPO | Attending: Family Medicine | Admitting: Family Medicine

## 2019-07-24 ENCOUNTER — Other Ambulatory Visit: Payer: Self-pay

## 2019-07-24 DIAGNOSIS — K649 Unspecified hemorrhoids: Secondary | ICD-10-CM | POA: Diagnosis not present

## 2019-07-24 MED ORDER — PROCTOFOAM HC 1-1 % EX FOAM: 1.0000 | Freq: Two times a day (BID) | CUTANEOUS | 0 refills | Status: DC

## 2019-07-24 NOTE — Discharge Instructions (Addendum)
Use medication as prescribed. Rest. Drink plenty of fluids.  Sitz baths.  Over-the-counter Colace once to twice a day as discussed.  Follow-up with gastroenterology or surgery in 1 week.  Follow up with your primary care physician this week as needed. Return to Urgent care for new or worsening concerns.

## 2019-07-24 NOTE — ED Triage Notes (Signed)
Pt has been on anucort suppositories x 7 days without relief. Pt c/o continued pain, denies bleeding.

## 2019-07-24 NOTE — ED Provider Notes (Signed)
[facility] ____________________________________________  Time seen: Approximately 10:50 AM  I have reviewed the triage vital signs and the nursing notes.   HISTORY  Chief Complaint Hemorrhoids   HPI Dawn Camacho is a 29 y.o. female presenting for evaluation of concern of hemorrhoid.  Patient reports she noticed this approximately 1 week ago.  Reports she normally has intermittent constipation based on her menstrual cycle.  States normally she will have constipation the week before her menstrual and then go back to having normal bowel movements daily.  States she is currently on her menstrual cycle.  Did a televisit last week for hemorrhoid and was called in and anucort suppository.  States that she is finishing this and it did help with pain and size, but the hemorrhoid has persisted.  Denies any rectal bleeding or blood in stool.  Denies abnormal colored stool.  Continues eat and drink well.  No accompanying abdominal pain or back pain.  States the previous suppositories use also helped with the pain.  Continues with intermittent itching.  Has had hemorrhoids in the past that went away with topical creams.  Denies injury or other change in activity.  Reports otherwise doing well.   Past Medical History:  Diagnosis Date  . Allergic rhinitis     Patient Active Problem List   Diagnosis Date Noted  . Dyshydrosis 11/21/2015    Past Surgical History:  Procedure Laterality Date  . TONSILLECTOMY       No current facility-administered medications for this encounter.  Current Outpatient Medications:  .  ANUCORT-HC 25 MG suppository, Place 25 mg rectally 2 (two) times daily., Disp: , Rfl:  .  hydrocortisone-pramoxine (PROCTOFOAM HC) rectal foam, Place 1 applicator rectally 2 (two) times daily., Disp: 10 g, Rfl: 0 .  JUNEL FE 1/20 1-20 MG-MCG tablet, , Disp: , Rfl:   Allergies Patient has no known allergies.  No family history on file.  Social History Social  History   Tobacco Use  . Smoking status: Never Smoker  . Smokeless tobacco: Never Used  Substance Use Topics  . Alcohol use: Yes    Comment: occasional  . Drug use: No    Review of Systems Constitutional: No fever ENT: No sore throat. Cardiovascular: Denies chest pain. Respiratory: Denies shortness of breath. Gastrointestinal: No abdominal pain.  No nausea, no vomiting.  As above.  Genitourinary: Negative for dysuria. Musculoskeletal: Negative for back pain. Skin: Negative for rash.   ____________________________________________   PHYSICAL EXAM:  VITAL SIGNS: ED Triage Vitals [07/24/19 0909]  Enc Vitals Group     BP 126/87     Pulse Rate 78     Resp 16     Temp 98 F (36.7 C)     Temp src      SpO2 96 %     Weight      Height      Head Circumference      Peak Flow      Pain Score 3     Pain Loc      Pain Edu?      Excl. in [place]?     Constitutional: Alert and oriented. Well appearing and in no acute distress. Eyes: Conjunctivae are normal.  ENT      Head: Normocephalic and atraumatic. Cardiovascular: Normal rate, regular rhythm. Grossly normal heart sounds.  Good peripheral circulation. Respiratory: Normal respiratory effort without tachypnea nor retractions. Breath sounds are clear and equal bilaterally. No wheezes, rales, rhonchi. Gastrointestinal: Soft and nontender. No  CVA tenderness. Rectal: Exam completed with [place] RN at bedside as chaperone. External rectum hemorrhoid present, pink in color, anteriorly 1 small portion thrombosed, otherwise nonthrombosed, no bleeding, mild tender, no surrounding erythema. Musculoskeletal: Steady gait.  Neurologic:  Normal speech and language.  Skin:  Skin is warm, dry and intact. No rash noted. Psychiatric: Mood and affect are normal. Speech and behavior are normal. Patient exhibits appropriate insight and judgment   ___________________________________________   LABS (all labs ordered are listed, but only  abnormal results are displayed)  Labs Reviewed - No data to display   PROCEDURES Procedures    INITIAL IMPRESSION / ASSESSMENT AND PLAN / ED COURSE  Pertinent labs & imaging results that were available during my care of the patient were reviewed by me and considered in my medical decision making (see chart for details).  Well-appearing patient.  No acute distress.  External hemorrhoid present.  Will treat with topical Proctofoam, over-the-counter Colace daily, fluids, sitz bath.  Counseled for persistence follow-up with GI or surgery for further intervention.  Discussed indication, risks and benefits of medications with patient. Discussed follow up and return parameters including no resolution or any worsening concerns. Patient verbalized understanding and agreed to plan.   ____________________________________________   FINAL CLINICAL IMPRESSION(S) / ED DIAGNOSES  Final diagnoses:  Hemorrhoids, unspecified hemorrhoid type     ED Discharge Orders         Ordered    hydrocortisone-pramoxine (PROCTOFOAM HC) rectal foam  2 times daily     07/24/19 0949           Note: This dictation was prepared with Dragon dictation along with smaller phrase technology. Any transcriptional errors that result from this process are unintentional.         Marylene Land, NP 07/24/19 1054

## 2020-06-13 ENCOUNTER — Encounter: Payer: Self-pay | Admitting: Emergency Medicine

## 2020-06-13 ENCOUNTER — Ambulatory Visit
Admission: EM | Admit: 2020-06-13 | Discharge: 2020-06-13 | Disposition: A | Discharge status: Home / Self Care | Payer: BC Managed Care – PPO | Attending: Physician Assistant | Admitting: Physician Assistant

## 2020-06-13 ENCOUNTER — Other Ambulatory Visit: Payer: Self-pay

## 2020-06-13 DIAGNOSIS — R0981 Nasal congestion: Secondary | ICD-10-CM | POA: Insufficient documentation

## 2020-06-13 DIAGNOSIS — J069 Acute upper respiratory infection, unspecified: Secondary | ICD-10-CM | POA: Insufficient documentation

## 2020-06-13 DIAGNOSIS — J029 Acute pharyngitis, unspecified: Secondary | ICD-10-CM | POA: Insufficient documentation

## 2020-06-13 LAB — GROUP A STREP BY PCR: Group A Strep by PCR: NOT DETECTED

## 2020-06-13 MED ORDER — IPRATROPIUM BROMIDE 0.06 % NA SOLN: 2.0000 | Freq: Four times a day (QID) | NASAL | 12 refills | Status: DC

## 2020-06-13 MED ORDER — PROMETHAZINE-DM 6.25-15 MG/5ML PO SYRP: 5.0000 mL | ORAL_SOLUTION | Freq: Four times a day (QID) | ORAL | 0 refills | Status: DC | PRN

## 2020-06-13 NOTE — Discharge Instructions (Signed)
Strep negative.   URI/COLD SYMPTOMS: Your exam today is consistent with a viral illness. Antibiotics are not indicated at this time. Use medications as directed, including cough syrup, nasal saline, and decongestants. Your symptoms should improve over the next few days and resolve within 7-10 days. Increase rest and fluids. F/u if symptoms worsen or predominate such as sore throat, ear pain, productive cough, shortness of breath, or if you develop high fevers or worsening fatigue over the next several days.

## 2020-06-13 NOTE — ED Provider Notes (Signed)
[facility]    CSN: [id number] Arrival date & time: 06/13/20  0901      History   Chief Complaint Chief Complaint  Patient presents with  . Cough    HPI Dawn Camacho is a 30 y.o. female presenting for 4 to 5-day history of cough that is dry and also productive at times, sore throat, nasal congestion, postnasal drainage.  Patient states that she has not had a fever.  Denies any sinus pain or ear pain.  No chest pain or breathing difficulty.  No nausea/vomiting or diarrhea.  Denies any sick contacts.  No known COVID-19 exposure and is taken 2 COVID tests at home which were negative.  Fully vaccinated for COVID-19.  Patient states she has tried multiple over-the-counter medications for cough and nasal decongestants without any improvement or relief in her symptoms.  Patient concerned about possible strep throat since her throat is painful.  She has no other complaints or concerns today.  HPI  Past Medical History:  Diagnosis Date  . Allergic rhinitis     Patient Active Problem List   Diagnosis Date Noted  . Dyshydrosis 11/21/2015    Past Surgical History:  Procedure Laterality Date  . TONSILLECTOMY      OB History   No obstetric history on file.      Home Medications    Prior to Admission medications   Medication Sig Start Date End Date Taking? Authorizing Provider  ipratropium (ATROVENT) 0.06 % nasal spray Place 2 sprays into both nostrils 4 (four) times daily. 06/13/20  Yes Danton Clap, PA-C  promethazine-dextromethorphan (PROMETHAZINE-DM) 6.25-15 MG/5ML syrup Take 5 mLs by mouth 4 (four) times daily as needed for cough. 06/13/20  Yes Danton Clap, PA-C  ANUCORT-HC 25 MG suppository Place 25 mg rectally 2 (two) times daily. 07/13/19   [provider]  hydrocortisone-pramoxine (PROCTOFOAM HC) rectal foam Place 1 applicator rectally 2 (two) times daily. 07/24/19   Marylene Land, NP  JUNEL FE 1/20 1-20 MG-MCG tablet  07/07/19   [provider]    Family History History reviewed. No pertinent family history.  Social History Social History   Tobacco Use  . Smoking status: Never Smoker  . Smokeless tobacco: Never Used  Vaping Use  . Vaping Use: Never used  Substance Use Topics  . Alcohol use: Yes    Comment: occasional  . Drug use: No     Allergies   Patient has no known allergies.   Review of Systems Review of Systems  Constitutional: Negative for chills, diaphoresis, fatigue and fever.  HENT: Positive for congestion, rhinorrhea and sore throat. Negative for ear pain, sinus pressure and sinus pain.   Respiratory: Positive for cough. Negative for shortness of breath.   Cardiovascular: Negative for chest pain.  Gastrointestinal: Negative for abdominal pain, nausea and vomiting.  Musculoskeletal: Negative for arthralgias and myalgias.  Skin: Negative for rash.  Neurological: Negative for weakness and headaches.  Hematological: Negative for adenopathy.     Physical Exam Triage Vital Signs ED Triage Vitals  Enc Vitals Group     BP 06/13/20 0920 128/80     Pulse Rate 06/13/20 0920 75     Resp 06/13/20 0920 18     Temp 06/13/20 0920 98.4 F (36.9 C)     Temp Source 06/13/20 0920 Oral     SpO2 06/13/20 0920 100 %     Weight 06/13/20 0918 199 lb 15.3 oz (90.7 kg)     Height 06/13/20  3419 5\' 6"  (1.676 m)     Head Circumference --      Peak Flow --      Pain Score 06/13/20 0918 7     Pain Loc --      Pain Edu? --      Excl. in [place]? --    No data found.  Updated Vital Signs BP 128/80 (BP Location: Left Arm)   Pulse 75   Temp 98.4 F (36.9 C) (Oral)   Resp 18   Ht 5\' 6"  (1.676 m)   Wt 199 lb 15.3 oz (90.7 kg)   LMP 06/06/2020 (Approximate)   SpO2 100%   BMI 32.27 kg/m    Physical Exam Vitals and nursing note reviewed.  Constitutional:      General: She is not in acute distress.    Appearance: Normal appearance. She is not ill-appearing or toxic-appearing.  HENT:     Head:  Normocephalic and atraumatic.     Right Ear: Hearing, tympanic membrane, ear canal and external ear normal.     Left Ear: Hearing, ear canal and external ear normal. A middle ear effusion is present.     Nose: Congestion and rhinorrhea (trace light yellow drainage) present.     Mouth/Throat:     Mouth: Mucous membranes are moist.     Pharynx: Oropharynx is clear. Posterior oropharyngeal erythema (mild with PND) present.  Eyes:     General: No scleral icterus.       Right eye: No discharge.        Left eye: No discharge.     Conjunctiva/sclera: Conjunctivae normal.  Cardiovascular:     Rate and Rhythm: Normal rate and regular rhythm.     Heart sounds: Normal heart sounds.  Pulmonary:     Effort: Pulmonary effort is normal. No respiratory distress.     Breath sounds: Normal breath sounds. No wheezing, rhonchi or rales.  Musculoskeletal:     Cervical back: Neck supple.  Skin:    General: Skin is dry.  Neurological:     General: No focal deficit present.     Mental Status: She is alert. Mental status is at baseline.     Motor: No weakness.     Gait: Gait normal.  Psychiatric:        Mood and Affect: Mood normal.        Behavior: Behavior normal.        Thought Content: Thought content normal.      UC Treatments / Results  Labs (all labs ordered are listed, but only abnormal results are displayed) Labs Reviewed  GROUP A STREP BY PCR    EKG   Radiology No results found.  Procedures Procedures (including critical care time)  Medications Ordered in UC Medications - No data to display  Initial Impression / Assessment and Plan / UC Course  I have reviewed the triage vital signs and the nursing notes.  Pertinent labs & imaging results that were available during my care of the patient were reviewed by me and considered in my medical decision making (see chart for details).   30 year old female presenting for 4 to 5-day history of cough, congestion, sore throat and  postnasal drainage.  All vital signs are normal and stable in clinic and she is overall well-appearing.  Molecular strep test obtained today which was negative.  She declines a COVID test.  Advised patient this is likely a viral illness versus allergies.  Her sore throat is likely due to  postnasal drainage.  Supportive care advised at this time with increasing rest and fluids.  I have sent Promethazine DM and Atrovent nasal spray.  Advised for her to follow-up with her clinic for any worsening symptoms or if she is not starting to improve over the next week.  Work note given.  Patient understanding and agreeable with plan.   Final Clinical Impressions(s) / UC Diagnoses   Final diagnoses:  Viral URI with cough  Nasal congestion     Discharge Instructions     Strep negative.   URI/COLD SYMPTOMS: Your exam today is consistent with a viral illness. Antibiotics are not indicated at this time. Use medications as directed, including cough syrup, nasal saline, and decongestants. Your symptoms should improve over the next few days and resolve within 7-10 days. Increase rest and fluids. F/u if symptoms worsen or predominate such as sore throat, ear pain, productive cough, shortness of breath, or if you develop high fevers or worsening fatigue over the next several days.      ED Prescriptions    Medication Sig Dispense Auth. Provider   promethazine-dextromethorphan (PROMETHAZINE-DM) 6.25-15 MG/5ML syrup Take 5 mLs by mouth 4 (four) times daily as needed for cough. 118 mL Laurene Footman B, PA-C   ipratropium (ATROVENT) 0.06 % nasal spray Place 2 sprays into both nostrils 4 (four) times daily. 15 mL Danton Clap, PA-C     PDMP not reviewed this encounter.   Danton Clap, PA-C 06/13/20 1019

## 2020-06-13 NOTE — ED Triage Notes (Signed)
Pt c/o cough, sore throat. Started about 5 days ago. She states she has taken several covid test at home and were negative. Denies fever.

## 2020-11-17 ENCOUNTER — Emergency Department: Payer: BC Managed Care – PPO

## 2020-11-17 ENCOUNTER — Emergency Department
Admission: EM | Admit: 2020-11-17 | Discharge: 2020-11-17 | Disposition: A | Discharge status: Home / Self Care | Payer: BC Managed Care – PPO | Attending: Emergency Medicine | Admitting: Emergency Medicine

## 2020-11-17 ENCOUNTER — Other Ambulatory Visit: Payer: Self-pay

## 2020-11-17 ENCOUNTER — Emergency Department
Admission: EM | Admit: 2020-11-17 | Discharge: 2020-11-17 | Disposition: A | Discharge status: Home / Self Care | Payer: BC Managed Care – PPO | Source: Home / Self Care | Attending: Emergency Medicine | Admitting: Emergency Medicine

## 2020-11-17 DIAGNOSIS — E669 Obesity, unspecified: Secondary | ICD-10-CM | POA: Insufficient documentation

## 2020-11-17 DIAGNOSIS — N200 Calculus of kidney: Secondary | ICD-10-CM | POA: Insufficient documentation

## 2020-11-17 DIAGNOSIS — R1032 Left lower quadrant pain: Secondary | ICD-10-CM | POA: Diagnosis present

## 2020-11-17 DIAGNOSIS — N210 Calculus in bladder: Secondary | ICD-10-CM | POA: Insufficient documentation

## 2020-11-17 DIAGNOSIS — K5904 Chronic idiopathic constipation: Secondary | ICD-10-CM | POA: Insufficient documentation

## 2020-11-17 DIAGNOSIS — N1330 Unspecified hydronephrosis: Secondary | ICD-10-CM | POA: Insufficient documentation

## 2020-11-17 DIAGNOSIS — N130 Hydronephrosis with ureteropelvic junction obstruction: Secondary | ICD-10-CM | POA: Insufficient documentation

## 2020-11-17 DIAGNOSIS — R109 Unspecified abdominal pain: Secondary | ICD-10-CM

## 2020-11-17 LAB — URINALYSIS, COMPLETE (UACMP) WITH MICROSCOPIC
Bilirubin Urine: NEGATIVE
Glucose, UA: NEGATIVE mg/dL
Ketones, ur: NEGATIVE mg/dL
Leukocytes,Ua: NEGATIVE
Nitrite: NEGATIVE
Protein, ur: 100 mg/dL — AB
RBC / HPF: >50 RBC/hpf — ABNORMAL HIGH (ref 0–5)
Specific Gravity, Urine: 1.032 — ABNORMAL HIGH (ref 1.005–1.030)
Squamous Epithelial / LPF: NONE SEEN (ref 0–5)
pH: 5 (ref 5.0–8.0)

## 2020-11-17 LAB — BASIC METABOLIC PANEL
Anion gap: 12 (ref 5–15)
BUN: 16 mg/dL (ref 6–20)
CO2: 21 mmol/L — ABNORMAL LOW (ref 22–32)
Calcium: 9.5 mg/dL (ref 8.9–10.3)
Chloride: 105 mmol/L (ref 98–111)
Creatinine, Ser: 0.99 mg/dL (ref 0.44–1.00)
GFR, Estimated: >60 mL/min (ref >60)
Glucose, Bld: 116 mg/dL — ABNORMAL HIGH (ref 70–99)
Potassium: 3.9 mmol/L (ref 3.5–5.1)
Sodium: 138 mmol/L (ref 135–145)

## 2020-11-17 LAB — CBC
HCT: 40.6 % (ref 36.0–46.0)
Hemoglobin: 14.5 g/dL (ref 12.0–15.0)
MCH: 31.5 pg (ref 26.0–34.0)
MCHC: 35.7 g/dL (ref 30.0–36.0)
MCV: 88.1 fL (ref 80.0–100.0)
Platelets: 331 10*3/uL (ref 150–400)
RBC: 4.61 MIL/uL (ref 3.87–5.11)
RDW: 12.3 % (ref 11.5–15.5)
WBC: 5.9 10*3/uL (ref 4.0–10.5)
nRBC: 0 % (ref 0.0–0.2)

## 2020-11-17 LAB — POC URINE PREG, ED: Preg Test, Ur: NEGATIVE

## 2020-11-17 MED ORDER — HYDROCODONE-ACETAMINOPHEN 5-325 MG PO TABS: 1.0000 | ORAL_TABLET | Freq: Three times a day (TID) | ORAL | 0 refills | Status: DC | PRN

## 2020-11-17 MED ORDER — TAMSULOSIN HCL 0.4 MG PO CAPS: 0.4000 mg | ORAL_CAPSULE | Freq: Every day | ORAL | 0 refills | Status: DC

## 2020-11-17 MED ORDER — OXYCODONE-ACETAMINOPHEN 5-325 MG PO TABS
1.0000 | ORAL_TABLET | ORAL | Status: DC | PRN
  Administered 2020-11-17: 1 via ORAL
  Filled 2020-11-17: qty 1

## 2020-11-17 NOTE — ED Notes (Signed)
See triage note. Pt was d/c and pain came back 8/10 in left flank.

## 2020-11-17 NOTE — ED Triage Notes (Signed)
Pt to ER via [facility] with complaints of left sided flank pain that radiates into lower back and into abdomen. Reports pain has been ongoing for 2-3 days but worsened last night. Denies urinary symptoms. No history of kidney stones.

## 2020-11-17 NOTE — ED Provider Notes (Signed)
[facility] Emergency Department Provider Note ____________________________________________  Time seen: 1030  I have reviewed the triage vital signs and the nursing notes.  HISTORY  Chief Complaint  Flank Pain   HPI Dawn Camacho is a 30 y.o. female presents to the ER today with recurrent left flank and left abdominal pain.  She was seen in the ER earlier for the same.  Urinalysis revealed a large amount of blood.  CT renal stone study was offered at that time however her pain had completely resolved after having a bowel movement so she declined.  She has a history of chronic constipation and thinks that she may have just been constipated but felt much better after having a bowel movement.  Since she was discharged, the pain has returned.  She describes the pain as sharp and stabbing.  She denies any urinary or vaginal complaints.  She has not taken any additional medications OTC.  Past Medical History:  Diagnosis Date   Allergic rhinitis     Patient Active Problem List   Diagnosis Date Noted   Dyshydrosis 11/21/2015    Past Surgical History:  Procedure Laterality Date   TONSILLECTOMY      Prior to Admission medications   Medication Sig Start Date End Date Taking? Authorizing Provider  HYDROcodone-acetaminophen (NORCO/VICODIN) 5-325 MG tablet Take 1 tablet by mouth every 8 (eight) hours as needed for moderate pain. 11/17/20 11/17/21 Yes Indigo Chaddock, Coralie Keens, NP  tamsulosin (FLOMAX) 0.4 MG CAPS capsule Take 1 capsule (0.4 mg total) by mouth daily. 11/17/20  Yes Marvin Grabill, Coralie Keens, NP  ANUCORT-HC 25 MG suppository Place 25 mg rectally 2 (two) times daily. 07/13/19   [provider]  hydrocortisone-pramoxine (PROCTOFOAM HC) rectal foam Place 1 applicator rectally 2 (two) times daily. 07/24/19   Marylene Land, NP  ipratropium (ATROVENT) 0.06 % nasal spray Place 2 sprays into both nostrils 4 (four) times daily. 06/13/20   Danton Clap, PA-C  JUNEL FE 1/20  1-20 MG-MCG tablet  07/07/19   [provider]  promethazine-dextromethorphan (PROMETHAZINE-DM) 6.25-15 MG/5ML syrup Take 5 mLs by mouth 4 (four) times daily as needed for cough. 06/13/20   Danton Clap, PA-C    Allergies Patient has no known allergies.  No family history on file.  Social History Social History   Tobacco Use   Smoking status: Never   Smokeless tobacco: Never  Vaping Use   Vaping Use: Never used  Substance Use Topics   Alcohol use: Yes    Comment: occasional   Drug use: No    Review of Systems  Constitutional: Negative for fever, chills or. Cardiovascular: Negative for chest pain or chest tightness. Respiratory: Negative for cough or shortness of breath. Gastrointestinal: Positive for LLQ abdominal pain, left flank pain.  Negative for vomiting and diarrhea. Genitourinary: Negative for urinary urgency, frequency, dysuria, blood in her urine, vaginal discharge, odor or abnormal bleeding. Musculoskeletal: Negative for back pain. Skin: Negative for rash. Neurological: Negative for focal weakness, tingling or numbness. ____________________________________________  PHYSICAL EXAM:  VITAL SIGNS: ED Triage Vitals  Enc Vitals Group     BP 11/17/20 1000 127/84     Pulse Rate 11/17/20 1000 74     Resp 11/17/20 1000 18     Temp 11/17/20 0959 97.9 F (36.6 C)     Temp Source 11/17/20 0959 Oral     SpO2 11/17/20 1000 96 %     Weight 11/17/20 0958 200 lb (90.7 kg)     Height  11/17/20 0958 5\' 6"  (1.676 m)     Head Circumference --      Peak Flow --      Pain Score 11/17/20 0958 5     Pain Loc --      Pain Edu? --      Excl. in [place]? --     Constitutional: Alert and oriented.  Obese, in no distress. Head: Normocephalic. Cardiovascular: Normal rate, regular rhythm.  Respiratory: Normal respiratory effort. No wheezes/rales/rhonchi noted. Gastrointestinal: Soft and nontender. No distention. No CVA tenderness noted. Musculoskeletal: Nontender with  normal range of motion in all extremities.  Neurologic:  Normal gait without ataxia. Normal speech and language.  Skin:  Skin is warm, dry and intact. No rash noted. ____________________________________________   RADIOLOGY Imaging Orders         CT Renal Stone Study    IMPRESSION: 1. Mild left hydronephrosis and hydroureter to the ureterovesicular junction. There are at least 3 tiny calculi within the dependent urinary bladder. Findings suggest one or more recently passed left-sided calculi. 2. Multiple small bilateral nonobstructive renal calculi, more numerous on the right.  ____________________________________________   INITIAL IMPRESSION / ASSESSMENT AND PLAN / ED COURSE  LLQ Pain, Left Flank Pain:  DDx include kidney stone, UTI, ovarian cyst CT renal stone study shows multiple tiny bilateral kidney stones Encouraged fluid intake RX for Flomax 0.4 mg daily x 7 days RX for Hydrocodone 5-325 mg PO Q8H prn Strainer provider Will have her follow up with urology as an outpatient   I reviewed the patient's prescription history over the last 12 months in the multi-state controlled substances database(s) that includes [place], [place], [place], [place], [place], [place], [place], [place], [place], [place], [place], [place], [place], and [place].  Results were notable for no recent controlled substances ____________________________________________  FINAL CLINICAL IMPRESSION(S) / ED DIAGNOSES  Final diagnoses:  Kidney stone      Jearld Fenton, NP 11/17/20 1118    Naaman Plummer, MD 11/17/20 1227

## 2020-11-17 NOTE — ED Triage Notes (Signed)
Pt back for CT scan. Reports now having burning with urination/ pain at urethra.

## 2020-11-17 NOTE — Discharge Instructions (Addendum)
You were seen today for flank pain and abdominal pain.  CT scan showed multiple kidney stones on both sides.  I am giving you Flomax to take to help you pass the kidney stones.  You should strain your urine to see if you catch any kidney stones.  Drink as much water as you can.  I am giving you a limited supply of pain medication to take for severe pain.  Please call urology and follow-up for further evaluation

## 2020-11-17 NOTE — Discharge Instructions (Addendum)
You were seen today for left flank pain, left lower quadrant pain and constipation.  Your symptoms actually resolved prior to evaluation.  Your labs did show a large amount of blood in the urine however this could be related to your recent menses.  We decided to get CT renal stone study at this time however if you are pain returns would recommend reevaluation with CT scanning.  We encourage fluids and high-fiber diet.

## 2020-11-17 NOTE — ED Notes (Signed)
See triage note. Pt c/o left flank pain that was sharp and stabbing. Pt stating pain has now subsided. Pt denies hx kidney stones or ovarian cysts. Pt hx IBS.

## 2020-11-17 NOTE — ED Provider Notes (Signed)
[facility] Emergency Department Provider Note ____________________________________________  Time seen: 0745  I have reviewed the triage vital signs and the nursing notes.  HISTORY  Chief Complaint  Flank Pain   HPI Dawn Camacho is a 30 y.o. female presents to the ER today with complaint of left-sided flank pain, left-sided abdominal pain and constipation.  She reports this started 2 to 3 days ago but worsened last night.  She reports she felt like she needed to have a BM, so she ate some food and took some laxatives but this was not effective.  She has a history of IBS with constipation.  She denies nausea, vomiting, urinary urgency, urinary frequency, dysuria, blood in urine, vaginal discharge, odor or irritation.  She reports she just got off her menses yesterday.  She reports her menses was normal.  She has no history of kidney stones or ovarian cysts.  She reports she actually just went to the bathroom and was able to have a bowel movement and now is not having any complaints of pain at this time.  Past Medical History:  Diagnosis Date   Allergic rhinitis     Patient Active Problem List   Diagnosis Date Noted   Dyshydrosis 11/21/2015    Past Surgical History:  Procedure Laterality Date   TONSILLECTOMY      Prior to Admission medications   Medication Sig Start Date End Date Taking? Authorizing Provider  ANUCORT-HC 25 MG suppository Place 25 mg rectally 2 (two) times daily. 07/13/19   [provider]  hydrocortisone-pramoxine (PROCTOFOAM HC) rectal foam Place 1 applicator rectally 2 (two) times daily. 07/24/19   Marylene Land, NP  ipratropium (ATROVENT) 0.06 % nasal spray Place 2 sprays into both nostrils 4 (four) times daily. 06/13/20   Danton Clap, PA-C  JUNEL FE 1/20 1-20 MG-MCG tablet  07/07/19   [provider]  promethazine-dextromethorphan (PROMETHAZINE-DM) 6.25-15 MG/5ML syrup Take 5 mLs by mouth 4 (four) times daily as  needed for cough. 06/13/20   Danton Clap, PA-C    Allergies Patient has no known allergies.  No family history on file.  Social History Social History   Tobacco Use   Smoking status: Never   Smokeless tobacco: Never  Vaping Use   Vaping Use: Never used  Substance Use Topics   Alcohol use: Yes    Comment: occasional   Drug use: No    Review of Systems  Constitutional: Negative for fever, chills or body aches. Cardiovascular: Negative for chest pain or chest tightness. Respiratory: Negative for cough or shortness of breath. Gastrointestinal: Negative for abdominal pain, nausea, vomiting, constipation or blood in her stool. Genitourinary: Negative for urgency, frequency, dysuria, blood in urine, vaginal discharge, vaginal odor or abnormal vaginal bleeding. Musculoskeletal: Negative for back pain. Skin: Negative for rash. ____________________________________________  PHYSICAL EXAM:  VITAL SIGNS: ED Triage Vitals  Enc Vitals Group     BP 11/17/20 0720 135/84     Pulse Rate 11/17/20 0720 84     Resp 11/17/20 0720 19     Temp 11/17/20 0720 98.4 F (36.9 C)     Temp Source 11/17/20 0720 Oral     SpO2 11/17/20 0720 99 %     Weight 11/17/20 0721 200 lb (90.7 kg)     Height 11/17/20 0721 5\' 6"  (1.676 m)     Head Circumference --      Peak Flow --      Pain Score 11/17/20 0721 8  Pain Loc --      Pain Edu? --      Excl. in [place]? --     Constitutional: Alert and oriented. Well appearing and in no distress. Eyes:  Normal extraocular movements Cardiovascular: Normal rate, regular rhythm.  Respiratory: Normal respiratory effort. No wheezes/rales/rhonchi. Gastrointestinal: Soft and nontender. No distention.  No CVA tenderness noted. Musculoskeletal: No bony tenderness noted over the spine Neurologic:  Normal gait without ataxia. Normal speech and language. No gross focal neurologic deficits are appreciated. Skin:  Skin is warm, dry and intact. No rash  noted.  ____________________________________________   LABS  Labs Reviewed  URINALYSIS, COMPLETE (UACMP) WITH MICROSCOPIC - Abnormal; Notable for the following components:      Result Value   Color, Urine YELLOW (*)    APPearance CLOUDY (*)    Specific Gravity, Urine 1.032 (*)    Hgb urine dipstick LARGE (*)    Protein, ur 100 (*)    RBC / HPF >50 (*)    Bacteria, UA RARE (*)    All other components within normal limits  BASIC METABOLIC PANEL - Abnormal; Notable for the following components:   CO2 21 (*)    Glucose, Bld 116 (*)    All other components within normal limits  CBC  POC URINE PREG, ED    ____________________________________________    INITIAL IMPRESSION / ASSESSMENT AND PLAN / ED COURSE  Left Flank Pain, LLQ Abdominal Pain, Constipation:  DDx include constipation, diverticulitis, acute cystitis, pyelonephritis, ovarian cyst Symptoms have actually resolved prior to this provider evaluation CBC, Bmet, urinalysis and urine pregnancy c/w large amount of blood in her urine. This could be residual vaginal bleeding from her menses that stopped yesterday. Discussed possibility of kidney stone but given the fact that she has no history of kidney stones and pain has completely resolved with BM, she declines CT renal stone study at this time Encouraged fluid, high fiber diet  Return precautions discussed ____________________________________________  FINAL CLINICAL IMPRESSION(S) / ED DIAGNOSES  Final diagnoses:  Chronic idiopathic constipation  Left flank pain  LLQ abdominal pain      Jearld Fenton, NP 11/17/20 0240    Naaman Plummer, MD 11/17/20 1225

## 2021-03-31 DIAGNOSIS — L300 Nummular dermatitis: Secondary | ICD-10-CM | POA: Diagnosis not present

## 2021-05-08 DIAGNOSIS — N930 Postcoital and contact bleeding: Secondary | ICD-10-CM | POA: Diagnosis not present

## 2021-05-08 DIAGNOSIS — Z113 Encounter for screening for infections with a predominantly sexual mode of transmission: Secondary | ICD-10-CM | POA: Diagnosis not present

## 2021-05-08 DIAGNOSIS — Z124 Encounter for screening for malignant neoplasm of cervix: Secondary | ICD-10-CM | POA: Diagnosis not present

## 2021-05-08 DIAGNOSIS — Z1151 Encounter for screening for human papillomavirus (HPV): Secondary | ICD-10-CM | POA: Diagnosis not present

## 2021-05-26 DIAGNOSIS — R87611 Atypical squamous cells cannot exclude high grade squamous intraepithelial lesion on cytologic smear of cervix (ASC-H): Secondary | ICD-10-CM | POA: Diagnosis not present

## 2021-05-26 DIAGNOSIS — R87619 Unspecified abnormal cytological findings in specimens from cervix uteri: Secondary | ICD-10-CM | POA: Diagnosis not present

## 2021-05-26 DIAGNOSIS — R87618 Other abnormal cytological findings on specimens from cervix uteri: Secondary | ICD-10-CM | POA: Diagnosis not present

## 2021-05-26 DIAGNOSIS — C539 Malignant neoplasm of cervix uteri, unspecified: Secondary | ICD-10-CM | POA: Diagnosis not present

## 2021-06-11 ENCOUNTER — Inpatient Hospital Stay: Payer: BC Managed Care – PPO | Attending: Obstetrics and Gynecology | Admitting: Obstetrics and Gynecology

## 2021-06-11 VITALS — BP 135/82 | HR 86 | Temp 100.0°F | Resp 16 | Wt 210.0 lb

## 2021-06-11 DIAGNOSIS — C531 Malignant neoplasm of exocervix: Secondary | ICD-10-CM

## 2021-06-11 DIAGNOSIS — C539 Malignant neoplasm of cervix uteri, unspecified: Secondary | ICD-10-CM | POA: Diagnosis not present

## 2021-06-11 DIAGNOSIS — F419 Anxiety disorder, unspecified: Secondary | ICD-10-CM | POA: Diagnosis not present

## 2021-06-11 NOTE — Progress Notes (Signed)
Gynecologic Oncology Consult Visit  ? ?Referring Provider: Dr Leafy Ro ? ?Chief Concern: cervical cancer ? ?Subjective:  ?Dawn Camacho is a 31 y.o. G80 female who is seen in consultation from Dr. Leafy Ro for cervical cancer. ? ?05/08/21 seen by Midwife for post coital spotting x 1.5 months. Cervical exam: Large, pink, friable and highly vascularized, non-tender, not smooth, irregular cervical growth extending from 11-5.   ?PAP ASCUS-H/HPV+. Patient's last menstrual period was 04/29/2021.  ?  ?Colposcopy confirmed irregular cervical growth extending from 11-5:00. 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  ? ?Married and uses Nuva ring for contraception.  Has not had a strong desire for children, but not sure for certain.  ?Had 2 Gardesil vaccinations, but then last in the series was not done due to recall or some other problem. ? ?Problem List: ?Patient Active Problem List  ? Diagnosis Date Noted  ? Dyshydrosis 11/21/2015  ? ? ?Past Medical History: ?Past Medical History:  ?Diagnosis Date  ? Allergic rhinitis   ? Allergy   ? Cervical cancer ([place])   ? ? ?Past Surgical History: ?Past Surgical History:  ?Procedure Laterality Date  ? TONSILECTOMY, ADENOIDECTOMY, BILATERAL MYRINGOTOMY AND TUBES Bilateral   ? TONSILLECTOMY    ? WISDOM TOOTH EXTRACTION    ? ?  ?  ? ?Family History: ?Family History  ?Problem Relation Age of Onset  ? Heart disease Father   ? Breast cancer Maternal Grandmother   ? Lung cancer Paternal Grandmother   ? Heart attack Paternal Grandfather   ? ? ?Social History: ?Social History  ? ?Socioeconomic History  ? Marital status: Married  ?  Spouse name: Not on file  ? Number of children: Not on file  ? Years of education: Not on file  ? Highest education level: Not on file  ?Occupational History  ? Not on file  ?Tobacco Use  ? Smoking status: Never  ? Smokeless tobacco: Never  ?Vaping Use  ? Vaping Use: Never used  ?Substance and Sexual Activity  ? Alcohol use: Yes   ?  Comment: occasional  ? Drug use: No  ? Sexual activity: Yes  ?Other Topics Concern  ? Not on file  ?Social History Narrative  ? Not on file  ? ?Social Determinants of Health  ? ?Financial Resource Strain: Not on file  ?Food Insecurity: Not on file  ?Transportation Needs: Not on file  ?Physical Activity: Not on file  ?Stress: Not on file  ?Social Connections: Not on file  ?Intimate Partner Violence: Not on file  ? ? ?Allergies: ?No Known Allergies ? ?Current Medications: ?Current Outpatient Medications  ?Medication Sig Dispense Refill  ? escitalopram (LEXAPRO) 10 MG tablet Take 10 mg by mouth daily.    ? etonogestrel-ethinyl estradiol (NUVARING) 0.12-0.015 MG/24HR vaginal ring Place vaginally.    ? ?No current facility-administered medications for this visit.  ? ? ?Review of Systems ?General: negative for, fevers, chills, fatigue, changes in sleep, changes in weight or appetite ?Skin: negative for ?changes in color, texture, moles or lesions ?Eyes: negative for, changes in vision, pain, diplopia ?HEENT: negative for, change in hearing, pain, discharge, tinnitus, vertigo, voice changes, sore throat, neck masses ?Pulmonary: negative for, dyspnea, orthopnea, productive cough ?Cardiac: negative for, palpitations, syncope, pain, discomfort, pressure ?Gastrointestinal: negative for, dysphagia, nausea, vomiting, jaundice, pain, constipation, diarrhea, hematemesis, hematochezia ?Genitourinary/Sexual: negative for, dysuria, discharge, hesitancy, nocturia, retention, stones, infections, STD's, incontinence ?Musculoskeletal: negative for, pain, stiffness, swelling, range of motion limitation ?Hematology: negative for, easy bruising, bleeding ?  Neurologic/Psych: negative for, headaches, seizures, paralysis, weakness, tremor, change in gait, change in sensation ? ?Objective:  ?Physical Examination:  ?BP 135/82 (BP Location: Left Arm, Patient Position: Sitting)   Pulse 86   Temp 100 ?F (37.8 ?C) (Tympanic)   Resp 16   Wt  210 lb (95.3 kg)   SpO2 99%   BMI 33.89 kg/m?  ?  ?ECOG Performance Status: 0 - Asymptomatic ? ?General appearance: alert, cooperative, and appears stated age ?HEENT:neck supple with midline trachea and thyroid without masses ?Lymph node survey: non-palpable, axillary, inguinal, supraclavicular ?Cardiovascular: regular rate and rhythm, no murmurs or gallops ?Respiratory: normal air entry, lungs clear to auscultation and no rales, rhonchi or wheezing ?Abdomen: soft, non-tender, without masses or organomegaly, no hernias, and well healed incision ?Back: inspection of back is normal ?Extremities: extremities normal, atraumatic, no cyanosis or edema ?Skin exam - normal coloration and turgor, no rashes, no suspicious skin lesions noted. ?Neurological exam reveals alert, oriented, normal speech, no focal findings or movement disorder noted. ? ?Pelvic: exam chaperoned by nurse;  Vulva: normal appearing vulva with no masses, tenderness or lesions; Vagina: normal vagina; Adnexa: normal adnexa in size, nontender and no masses; Uterus: uterus is normal size, shape, consistency and nontender; Cervix: 4 cm exophytic mass growing out of cervix; Rectal: normal rectal, no masses ? ? ? ?Lab Review ?  ?Last CBC ?Lab Results  ?Component Value Date  ? WBC 5.9 11/17/2020  ? HGB 14.5 11/17/2020  ? HCT 40.6 11/17/2020  ? MCV 88.1 11/17/2020  ? MCH 31.5 11/17/2020  ? RDW 12.3 11/17/2020  ? PLT 331 11/17/2020  ?  ?  Chemistry   ?   ?Component Value Date/Time  ? NA 138 11/17/2020 0723  ? K 3.9 11/17/2020 0723  ? CL 105 11/17/2020 0723  ? CO2 21 (L) 11/17/2020 0723  ? BUN 16 11/17/2020 0723  ? CREATININE 0.99 11/17/2020 0723  ?    ?Component Value Date/Time  ? CALCIUM 9.5 11/17/2020 0723  ?  ?  ?   ?Assessment:  ?Dawn Camacho is a 31 y.o. G45 female diagnosed with invasive squamous cell cervical cancer.  This is a 4 cm exophytic mass that seems to be growing out from the cervix rather than in to the cervix.  The cervix is not expanded  or enlarged.  ? ?She is married and they are not sure if they want children.  Accompanied by mother in law today.  ? ?Medical co-morbidities complicating care: anxiety.  ?Plan:  ? ?Problem List Items Addressed This Visit   ? ?  ? Genitourinary  ? Cervical cancer ([place]) - Primary  ? Relevant Orders  ? MR PELVIS W WO CONTRAST  ? ? ?We discussed options for management including radical hysterectomy and pelvic lymph node biopsies. The ovaries could be preserved for future egg harvest for surrogate pregnancy.  Although it is a large lesion by measurement of dimensions, the actual cervical involvement seems minimal. Thus, would favor primary surgery rather than chemoradiation unless imaging shows evidence of nodal involvement.   ? ?She does not have children.  Fertility sparing surgery for cervical cancer generally is reserved for patients with smaller lesions less than 2 cm.  However, this cancer appears to be growing out from the cervix rather than in to it.  Thus, it may be possible to do a trachelectomy with a big margin if MRI is reassuring, but would need to review this in the context of [place] [facility].   ? ?  We will obtain a pelvic MRI to better assess the size and invasion of the cancer and to determine if there is evidence of pelvic lymph node involvement. She will discuss the various treatment options we reviewed with her husband.  If they are not interested in preserving her ability to have children, radical hysterectomy and nodal sampling would be the best approach if no concern for nodal disease on imaging.  If nodal disease found would be best to get PET/CT and plan for primary chemoradiation unless distant disease found that would suggest the need for primary chemotherapy. If there is a plan to do radiation we will refer to infertility specialist to consider egg harvest in advance of treatment.  ? ?We will discuss options further when MRI results are available.  ? ?The patient's diagnosis, an  outline of the further diagnostic and laboratory studies which will be required, the recommendation, and alternatives were discussed.  All questions were answered to the patient's satisfaction. ?  ? ?Mellody Drown

## 2021-06-12 ENCOUNTER — Telehealth: Payer: Self-pay | Admitting: *Deleted

## 2021-06-12 ENCOUNTER — Ambulatory Visit
Admission: RE | Admit: 2021-06-12 | Discharge: 2021-06-12 | Disposition: A | Discharge status: Home / Self Care | Payer: BC Managed Care – PPO | Source: Ambulatory Visit | Attending: Nurse Practitioner | Admitting: Nurse Practitioner

## 2021-06-12 DIAGNOSIS — C531 Malignant neoplasm of exocervix: Secondary | ICD-10-CM | POA: Insufficient documentation

## 2021-06-12 DIAGNOSIS — R1909 Other intra-abdominal and pelvic swelling, mass and lump: Secondary | ICD-10-CM | POA: Diagnosis not present

## 2021-06-12 DIAGNOSIS — C539 Malignant neoplasm of cervix uteri, unspecified: Secondary | ICD-10-CM | POA: Diagnosis not present

## 2021-06-12 DIAGNOSIS — R590 Localized enlarged lymph nodes: Secondary | ICD-10-CM | POA: Diagnosis not present

## 2021-06-12 MED ORDER — GADOBUTROL 1 MMOL/ML IV SOLN
9.0000 mL | Freq: Once | INTRAVENOUS | Status: AC | PRN
  Administered 2021-06-12: 9 mL via INTRAVENOUS

## 2021-06-12 NOTE — Telephone Encounter (Signed)
Patient called asking for Lauren to call her regarding her results ? ? ?  ?IMPRESSION: ?1. 4.2 x 3.7 x 2.9 cm cervical mass involving the anterior aspect of ?the cervix and also likely involving the anterior aspect of the ?lower uterine segment. This extends into the vagina mainly on the ?left side. No MR findings to suggest involvement of the bladder, ?urethra rectum. ?2. Borderline enlarged bilateral pelvic sidewall lymph nodes ?worrisome for metastatic disease. ?  ?  ?Electronically Signed ?  By: Marijo Sanes M.D. ?  On: 06/12/2021 11:04 ?  ?

## 2021-06-13 ENCOUNTER — Other Ambulatory Visit: Payer: Self-pay | Admitting: Nurse Practitioner

## 2021-06-13 DIAGNOSIS — C538 Malignant neoplasm of overlapping sites of cervix uteri: Secondary | ICD-10-CM

## 2021-06-13 NOTE — Progress Notes (Signed)
Dr. Fransisca Connors reviewed MRI results with patient. Unfortunately, she has more advanced disease than he was hoping to see and explained that he does not see any possibility of fertility sparing surgery. He recommends PET to determine if she is a candidate for full radical hysterectomy with node sampling vs chemo-radiation. PET ordered.  ?

## 2021-06-19 ENCOUNTER — Ambulatory Visit (HOSPITAL_COMMUNITY)
Admission: RE | Admit: 2021-06-19 | Discharge: 2021-06-19 | Disposition: A | Discharge status: Home / Self Care | Payer: BC Managed Care – PPO | Source: Ambulatory Visit | Attending: Nurse Practitioner | Admitting: Nurse Practitioner

## 2021-06-19 DIAGNOSIS — N888 Other specified noninflammatory disorders of cervix uteri: Secondary | ICD-10-CM | POA: Diagnosis not present

## 2021-06-19 DIAGNOSIS — C539 Malignant neoplasm of cervix uteri, unspecified: Secondary | ICD-10-CM | POA: Diagnosis not present

## 2021-06-19 DIAGNOSIS — N200 Calculus of kidney: Secondary | ICD-10-CM | POA: Diagnosis not present

## 2021-06-19 DIAGNOSIS — R590 Localized enlarged lymph nodes: Secondary | ICD-10-CM | POA: Diagnosis not present

## 2021-06-19 DIAGNOSIS — C538 Malignant neoplasm of overlapping sites of cervix uteri: Secondary | ICD-10-CM | POA: Diagnosis not present

## 2021-06-19 MED ORDER — FLUDEOXYGLUCOSE F - 18 (FDG) INJECTION
9.9000 | Freq: Once | INTRAVENOUS | Status: AC | PRN
  Administered 2021-06-19: 9.9 via INTRAVENOUS

## 2021-06-20 ENCOUNTER — Other Ambulatory Visit: Payer: Self-pay | Admitting: Nurse Practitioner

## 2021-06-20 ENCOUNTER — Telehealth: Payer: Self-pay | Admitting: *Deleted

## 2021-06-20 ENCOUNTER — Telehealth: Payer: Self-pay | Admitting: Nurse Practitioner

## 2021-06-20 DIAGNOSIS — C539 Malignant neoplasm of cervix uteri, unspecified: Secondary | ICD-10-CM

## 2021-06-20 DIAGNOSIS — C531 Malignant neoplasm of exocervix: Secondary | ICD-10-CM

## 2021-06-20 NOTE — Telephone Encounter (Signed)
Patient called asking for Lauren to call her regarding the PET results and wanting records sent to another provider for a second opinion ?

## 2021-06-20 NOTE — Telephone Encounter (Signed)
Spoke to patient's stepmother regarding pet results at patient's request. Reviewed appointments with med-onc and rad-onc for next week.  ?

## 2021-06-20 NOTE — Progress Notes (Signed)
PET results forwarded to Dr. Fransisca Connors. He recommends chemo-radiation. He will call patient to discuss. I also spoke to patient. Briefly reviewed pet. Reviewed that there is not a good option for preserving fertility and that doing so may delay her care. She verbalizes understanding and wishes to proceed and expedite treatment. She also asked if I would review all findings with her step-mother. I called and had to leave vm. Will speak to her as well when she returns call. Urgent referrals to med-onc and rad-onc sent.  ?

## 2021-06-21 ENCOUNTER — Telehealth: Payer: Self-pay | Admitting: Obstetrics and Gynecology

## 2021-06-24 ENCOUNTER — Encounter: Payer: Self-pay | Admitting: Oncology

## 2021-06-24 ENCOUNTER — Other Ambulatory Visit: Payer: Self-pay

## 2021-06-24 ENCOUNTER — Inpatient Hospital Stay (HOSPITAL_BASED_OUTPATIENT_CLINIC_OR_DEPARTMENT_OTHER): Payer: BC Managed Care – PPO | Admitting: Oncology

## 2021-06-24 VITALS — BP 123/73 | HR 92 | Temp 98.6°F | Resp 18 | Ht 66.5 in | Wt 209.6 lb

## 2021-06-24 DIAGNOSIS — C531 Malignant neoplasm of exocervix: Secondary | ICD-10-CM | POA: Insufficient documentation

## 2021-06-24 DIAGNOSIS — Z7189 Other specified counseling: Secondary | ICD-10-CM

## 2021-06-24 DIAGNOSIS — R5383 Other fatigue: Secondary | ICD-10-CM | POA: Insufficient documentation

## 2021-06-24 DIAGNOSIS — R590 Localized enlarged lymph nodes: Secondary | ICD-10-CM | POA: Insufficient documentation

## 2021-06-24 DIAGNOSIS — Z510 Encounter for antineoplastic radiation therapy: Secondary | ICD-10-CM | POA: Diagnosis not present

## 2021-06-24 DIAGNOSIS — Z79899 Other long term (current) drug therapy: Secondary | ICD-10-CM | POA: Insufficient documentation

## 2021-06-24 DIAGNOSIS — Z8249 Family history of ischemic heart disease and other diseases of the circulatory system: Secondary | ICD-10-CM | POA: Insufficient documentation

## 2021-06-24 DIAGNOSIS — R7989 Other specified abnormal findings of blood chemistry: Secondary | ICD-10-CM | POA: Insufficient documentation

## 2021-06-24 DIAGNOSIS — Z803 Family history of malignant neoplasm of breast: Secondary | ICD-10-CM | POA: Insufficient documentation

## 2021-06-24 DIAGNOSIS — Z801 Family history of malignant neoplasm of trachea, bronchus and lung: Secondary | ICD-10-CM | POA: Insufficient documentation

## 2021-06-24 DIAGNOSIS — N200 Calculus of kidney: Secondary | ICD-10-CM | POA: Insufficient documentation

## 2021-06-24 DIAGNOSIS — C539 Malignant neoplasm of cervix uteri, unspecified: Secondary | ICD-10-CM | POA: Insufficient documentation

## 2021-06-24 DIAGNOSIS — Z5111 Encounter for antineoplastic chemotherapy: Secondary | ICD-10-CM | POA: Insufficient documentation

## 2021-06-24 MED ORDER — PROCHLORPERAZINE MALEATE 10 MG PO TABS: 10.0000 mg | ORAL_TABLET | Freq: Four times a day (QID) | ORAL | 1 refills | Status: DC | PRN

## 2021-06-24 MED ORDER — ONDANSETRON HCL 8 MG PO TABS: 8.0000 mg | ORAL_TABLET | Freq: Two times a day (BID) | ORAL | 1 refills | Status: DC | PRN

## 2021-06-24 MED ORDER — LORAZEPAM 0.5 MG PO TABS: 0.5000 mg | ORAL_TABLET | Freq: Four times a day (QID) | ORAL | 0 refills | Status: DC | PRN

## 2021-06-24 MED ORDER — LIDOCAINE-PRILOCAINE 2.5-2.5 % EX CREA: TOPICAL_CREAM | CUTANEOUS | 3 refills | Status: DC

## 2021-06-24 MED ORDER — DEXAMETHASONE 4 MG PO TABS: 8.0000 mg | ORAL_TABLET | Freq: Every day | ORAL | 1 refills | Status: DC

## 2021-06-24 NOTE — Progress Notes (Signed)
START OFF PATHWAY REGIMEN - Other   [id number]:Cisplatin 40 mg/m2 IV D1 q7 Days + RT:   A cycle is every 7 days:     Cisplatin   **Always confirm dose/schedule in your pharmacy ordering system**  Patient Characteristics: Intent of Therapy: Curative Intent, Discussed with Patient 

## 2021-06-25 ENCOUNTER — Encounter: Payer: Self-pay | Admitting: Radiation Oncology

## 2021-06-25 ENCOUNTER — Ambulatory Visit
Admission: RE | Admit: 2021-06-25 | Discharge: 2021-06-25 | Disposition: A | Discharge status: Home / Self Care | Payer: BC Managed Care – PPO | Source: Ambulatory Visit | Attending: Radiation Oncology | Admitting: Radiation Oncology

## 2021-06-25 VITALS — BP 138/88 | HR 85 | Temp 96.6°F | Resp 18 | Ht 66.0 in | Wt 210.9 lb

## 2021-06-25 DIAGNOSIS — Z7952 Long term (current) use of systemic steroids: Secondary | ICD-10-CM | POA: Insufficient documentation

## 2021-06-25 DIAGNOSIS — R599 Enlarged lymph nodes, unspecified: Secondary | ICD-10-CM | POA: Diagnosis not present

## 2021-06-25 DIAGNOSIS — C531 Malignant neoplasm of exocervix: Secondary | ICD-10-CM | POA: Insufficient documentation

## 2021-06-25 DIAGNOSIS — Z801 Family history of malignant neoplasm of trachea, bronchus and lung: Secondary | ICD-10-CM | POA: Insufficient documentation

## 2021-06-25 DIAGNOSIS — Z803 Family history of malignant neoplasm of breast: Secondary | ICD-10-CM | POA: Diagnosis not present

## 2021-06-25 DIAGNOSIS — Z79899 Other long term (current) drug therapy: Secondary | ICD-10-CM | POA: Diagnosis not present

## 2021-06-25 DIAGNOSIS — C539 Malignant neoplasm of cervix uteri, unspecified: Secondary | ICD-10-CM

## 2021-06-25 NOTE — Consult Note (Signed)
?NEW PATIENT EVALUATION ? ?Name: Dawn Camacho  ?MRN: [id number]  ?Date:   06/25/2021     ?DOB: 06/24/1990 ? ? ?This 31 y.o. 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 ? ?REFERRING PHYSICIAN: Verlon Au, NP ? ?CHIEF COMPLAINT:  ?Chief Complaint  ?Patient presents with  ? Consult  ?  Cervical Cancer  ? ? ?DIAGNOSIS: The encounter diagnosis was Cervical cancer, FIGO stage III ([place]). ?  ?PREVIOUS INVESTIGATIONS:  ?MRI scan PET scan reviewed ?Pathology report reviewed ?Clinical notes reviewed ? ?HPI: Patient is a 31 year old female who presented with postcoital spotting.  She was seen by her GYN and found to have a large pink friable highly vascularized lesion extending from 11 the 5 o'clock position of the cervix.  Biopsies were positive for moderate to poorly differentiated squamous cell carcinoma.  Imaging studies including MRI of the pelvis confirmed a 4.2 x 3.7 x 2.9 cervical mass involving the anterior aspect of the cervix likely also involving the anterior aspect of the lower uterine segment as well extending to the vagina mainly on the left side.  She had borderline enlarged bilateral pelvic sidewall lymph nodes worrisome for metastatic disease.  PET/CT confirmed hypermetabolic to be in the cervix compatible with primary malignancy she also had a hypermetabolic subcentimeter bilateral external iliac nodes and mild hypermetabolic left inguinal lymph nodes concerning for additional site of metastatic disease.  She has been seen with by medical oncology as well as GYN oncology.  Recommendations been for concurrent chemoradiation.  She is seen today for radiation oncology opinion.  She continues to spot occasionally.  She is having no significant pelvic pain or any problems with bowel function or lower urinary tract symptoms. ? ?PLANNED TREATMENT REGIMEN: Concurrent chemoradiation therapy radiation include both external beam  treatment using IMRT treatment planning and delivery as well as brachytherapy ? ?PAST MEDICAL HISTORY:  has a past medical history of Allergic rhinitis, Allergy, and Cervical cancer ([place]).   ? ?PAST SURGICAL HISTORY:  ?Past Surgical History:  ?Procedure Laterality Date  ? TONSILECTOMY, ADENOIDECTOMY, BILATERAL MYRINGOTOMY AND TUBES Bilateral   ? TONSILLECTOMY    ? WISDOM TOOTH EXTRACTION    ? ? ?FAMILY HISTORY: family history includes Breast cancer in her maternal grandmother; Heart attack in her paternal grandfather; Heart disease in her father; Lung cancer in her paternal grandmother. ? ?SOCIAL HISTORY:  reports that she has never smoked. She has never used smokeless tobacco. She reports current alcohol use of about 1.0 standard drink per week. She reports that she does not use drugs. ? ?ALLERGIES: Patient has no known allergies. ? ?MEDICATIONS:  ?Current Outpatient Medications  ?Medication Sig Dispense Refill  ? aspirin-acetaminophen-caffeine (EXCEDRIN MIGRAINE) 250-250-65 MG tablet Take by mouth every 6 (six) hours as needed for headache.    ? dexamethasone (DECADRON) 4 MG tablet Take 2 tablets (8 mg total) by mouth daily. Take daily x 3 days starting the day after cisplatin chemotherapy. Take with food. 30 tablet 1  ? escitalopram (LEXAPRO) 10 MG tablet Take 10 mg by mouth daily. (Patient not taking: Reported on 06/24/2021)    ? etonogestrel-ethinyl estradiol (NUVARING) 0.12-0.015 MG/24HR vaginal ring Place vaginally.    ? lidocaine-prilocaine (EMLA) cream Apply to affected area once 30 g 3  ? LORazepam (ATIVAN) 0.5 MG tablet Take 1 tablet (0.5 mg total) by mouth every 6 (six) hours as needed (Nausea or vomiting). 30 tablet 0  ? ondansetron (ZOFRAN)  8 MG tablet Take 1 tablet (8 mg total) by mouth 2 (two) times daily as needed. Start on the third day after cisplatin chemotherapy. 30 tablet 1  ? prochlorperazine (COMPAZINE) 10 MG tablet Take 1 tablet (10 mg total) by mouth every 6 (six) hours as needed (Nausea or  vomiting). 30 tablet 1  ? ?No current facility-administered medications for this encounter.  ? ? ?ECOG PERFORMANCE STATUS:  0 - Asymptomatic ? ?REVIEW OF SYSTEMS: ?Patient denies any weight loss, fatigue, weakness, fever, chills or night sweats. Patient denies any loss of vision, blurred vision. Patient denies any ringing  of the ears or hearing loss. No irregular heartbeat. Patient denies heart murmur or history of fainting. Patient denies any chest pain or pain radiating to her upper extremities. Patient denies any shortness of breath, difficulty breathing at night, cough or hemoptysis. Patient denies any swelling in the lower legs. Patient denies any nausea vomiting, vomiting of blood, or coffee ground material in the vomitus. Patient denies any stomach pain. Patient states has had normal bowel movements no significant constipation or diarrhea. Patient denies any dysuria, hematuria or significant nocturia. Patient denies any problems walking, swelling in the joints or loss of balance. Patient denies any skin changes, loss of hair or loss of weight. Patient denies any excessive worrying or anxiety or significant depression. Patient denies any problems with insomnia. Patient denies excessive thirst, polyuria, polydipsia. Patient denies any swollen glands, patient denies easy bruising or easy bleeding. Patient denies any recent infections, allergies or URI. Patient "s visual fields have not changed significantly in recent time. ?  ?PHYSICAL EXAM: ?BP 138/88   Pulse 85   Temp (!) 96.6 ?F (35.9 ?C)   Resp 18   Ht '5\' 6"'$  (1.676 m)   Wt 210 lb 14.4 oz (95.7 kg)   BMI 34.04 kg/m?  ?Well-developed well-nourished patient in NAD. HEENT reveals PERLA, EOMI, discs not visualized.  Oral cavity is clear. No oral mucosal lesions are identified. Neck is clear without evidence of cervical or supraclavicular adenopathy. Lungs are clear to A&P. Cardiac examination is essentially unremarkable with regular rate and rhythm without  murmur rub or thrill. Abdomen is benign with no organomegaly or masses noted. Motor sensory and DTR levels are equal and symmetric in the upper and lower extremities. Cranial nerves II through XII are grossly intact. Proprioception is intact. No peripheral adenopathy or edema is identified. No motor or sensory levels are noted. Crude visual fields are within normal range. ? ?LABORATORY DATA: Pathology reports reviewed ? ?  ?RADIOLOGY RESULTS: MRI scan as well as PET scan reviewed compatible with above-stated findings ? ? ?IMPRESSION: Stage II A2 squamous cell carcinoma of the cervix in 31 year old female ? ?PLAN: At this time we will go ahead with concurrent chemoradiation therapy.  She would receive weekly cisplatin under the direction of medical oncology.  I would use IMRT treatment planning and delivery.  I would treat her PET positive pelvic nodes to 60 Gray over 6 weeks as well as treating the remaining pelvic nodes to 50 Gray as well as her PET positive cervical carcinoma.  I also refer her to Dr. Christel Mormon at [facility] for consideration of brachytherapy.  Risks and benefits of treatment including increased lower urinary tract symptoms diarrhea fatigue alteration of blood counts skin reaction all were reviewed in detail with the patient and her mother.  They both seem to comprehend my treatment plan well.  I have personally set up and ordered CT simulation.  We will  use PET fusion study for treatment planning purposes.  There will be extra effort by both professional staff as well as technical staff to coordinate and manage concurrent chemoradiation and ensuing side effects during her treatments. ? ?I would like to take this opportunity to thank you for allowing me to participate in the care of your patient.. ? ? ? ? ?Noreene Filbert, MD ? ? ? ? ? ? ? ? ?

## 2021-06-26 ENCOUNTER — Telehealth: Payer: Self-pay | Admitting: *Deleted

## 2021-06-26 ENCOUNTER — Telehealth: Payer: Self-pay

## 2021-06-26 ENCOUNTER — Other Ambulatory Visit: Payer: Self-pay | Admitting: Student

## 2021-06-26 NOTE — Progress Notes (Signed)
Patient on schedule for port placement. Spoke with patient on phone with pre procedure instructions given. Made aware to be here @ 0900, NPO after MN prior to procedure as well as driver post procedure/recovery/discharge. Stated understanding. ?

## 2021-06-26 NOTE — Telephone Encounter (Signed)
Reached out to [place] and left a VM asking to return call. Called pt back and asked her of any confusion. Pt stated she did not have the chance to relay the message to her step mom regarding the port and she called before she can get the information. Pt understands her appointments and if [facility] calls back we can go over with her as well but she will try to reach Ms. Rise Paganini and let her know of the future appointments.

## 2021-06-26 NOTE — Telephone Encounter (Signed)
Rise Paganini called reporting that patient's port insertion date seems to have changed and that she has treatment scheduled that they know nothing about and is asking fr a call to clarify patient appts. Please return her call [phone] ? ?

## 2021-06-26 NOTE — Telephone Encounter (Signed)
Reached out to pt regarding port insertion appoinment on 06/30/21  gave instructions to NPO 8 hours prior, a driver, and arrive at [facility], Pt understands and agrees.  ?

## 2021-06-26 NOTE — Telephone Encounter (Signed)
Ms. Dawn Camacho returned my call regarding pts future appointments. Went over all of the future appointsments and explained each appointment and Dawn Camacho and pt are now on the same page, both parties understand all appointments.  ?

## 2021-06-27 ENCOUNTER — Inpatient Hospital Stay: Payer: BC Managed Care – PPO

## 2021-06-27 ENCOUNTER — Encounter: Payer: Self-pay | Admitting: Oncology

## 2021-06-29 ENCOUNTER — Encounter: Payer: Self-pay | Admitting: Oncology

## 2021-06-29 NOTE — Progress Notes (Signed)
? ?Hematology/Oncology Consult note ?[facility] ?[phone] [phone] ? ?Patient Care Team: ?Pcp, No as PCP - General ?Patient, No Pcp Per (Inactive) (General Practice)  ? ?Name of the patient: Dawn Camacho  ?[id number]  ?07/03/1990  ? ? ?Reason for referral-diagnosis of cervical cancer ?  ?Referring physician-Dr. Fransisca Connors ? ?Date of visit: 06/29/21 ? ? ?History of presenting illness- Patient is a 31 year old female who was found to have postcoital bleeding that was ongoing for about 2 months.  She was seen by GYN and had a cervical exam which showed a large pink friable vascularized irregular growth in the cervix extending from 11:00 to 5 o'clock position.  Pap showed ASCUS and high risk HPV.  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  She is married and uses NuvaRing for contraception.  She does not have any children yet.  She has had 2 Gardasil vaccinations in the past but did not receive a booster patient was seen by GYN oncology Dr. Fransisca Connors and plan was to obtain a PET CT scan to decide if she would be a candidate for surgery.  PET scan showed hypermetabolic activity of the cervix compatible with primary malignancy.  Hypermetabolic subcentimeter bilateral external iliac lymph nodes concerning for metastatic disease.  Hypermetabolic left inguinal lymph node concerning for additional site of metastatic disease.  Bilateral nonobstructing renal stones. ? ?ECOG PS- 0 ? ?Pain scale- 0 ? ? ?Review of systems- Review of Systems  ?Constitutional:  Negative for chills, fever, malaise/fatigue and weight loss.  ?HENT:  Negative for congestion, ear discharge and nosebleeds.   ?Eyes:  Negative for blurred vision.  ?Respiratory:  Negative for cough, hemoptysis, sputum production, shortness of breath and wheezing.   ?Cardiovascular:  Negative for chest pain, palpitations, orthopnea and  claudication.  ?Gastrointestinal:  Negative for abdominal pain, blood in stool, constipation, diarrhea, heartburn, melena, nausea and vomiting.  ?Genitourinary:  Negative for dysuria, flank pain, frequency, hematuria and urgency.  ?Musculoskeletal:  Negative for back pain, joint pain and myalgias.  ?Skin:  Negative for rash.  ?Neurological:  Negative for dizziness, tingling, focal weakness, seizures, weakness and headaches.  ?Endo/Heme/Allergies:  Does not bruise/bleed easily.  ?Psychiatric/Behavioral:  Negative for depression and suicidal ideas. The patient does not have insomnia.   ? ?No Known Allergies ? ?Patient Active Problem List  ? Diagnosis Date Noted  ? Cervical cancer, FIGO stage III ([place]) 06/24/2021  ? Cervical cancer ([place]) 06/11/2021  ? Dyshydrosis 11/21/2015  ? ? ? ?Past Medical History:  ?Diagnosis Date  ? Allergic rhinitis   ? Allergy   ? Cervical cancer ([place])   ? ? ? ?Past Surgical History:  ?Procedure Laterality Date  ? TONSILECTOMY, ADENOIDECTOMY, BILATERAL MYRINGOTOMY AND TUBES Bilateral   ? TONSILLECTOMY    ? WISDOM TOOTH EXTRACTION    ? ? ?Social History  ? ?Socioeconomic History  ? Marital status: Married  ?  Spouse name: Not on file  ? Number of children: Not on file  ? Years of education: Not on file  ? Highest education level: Not on file  ?Occupational History  ? Not on file  ?Tobacco Use  ? Smoking status: Never  ? Smokeless tobacco: Never  ?Vaping Use  ? Vaping Use: Never used  ?Substance and Sexual Activity  ? Alcohol use: Yes  ?  Alcohol/week: 1.0 standard drink  ?  Types: 1 Glasses of wine per week  ?  Comment: occasional--every couple of  weeks.  ? Drug use: No  ? Sexual activity: Yes  ?Other Topics Concern  ? Not on file  ?Social History Narrative  ? Not on file  ? ?Social Determinants of Health  ? ?Financial Resource Strain: Not on file  ?Food Insecurity: Not on file  ?Transportation Needs: Not on file  ?Physical Activity: Not on file  ?Stress: Not on file  ?Social Connections: Not  on file  ?Intimate Partner Violence: Not on file  ? ?  ?Family History  ?Problem Relation Age of Onset  ? Heart disease Father   ? Breast cancer Maternal Grandmother   ? Lung cancer Paternal Grandmother   ? Heart attack Paternal Grandfather   ? ? ? ?Current Outpatient Medications:  ?  aspirin-acetaminophen-caffeine (EXCEDRIN MIGRAINE) 250-250-65 MG tablet, Take by mouth every 6 (six) hours as needed for headache., Disp: , Rfl:  ?  etonogestrel-ethinyl estradiol (NUVARING) 0.12-0.015 MG/24HR vaginal ring, Place vaginally., Disp: , Rfl:  ?  dexamethasone (DECADRON) 4 MG tablet, Take 2 tablets (8 mg total) by mouth daily. Take daily x 3 days starting the day after cisplatin chemotherapy. Take with food., Disp: 30 tablet, Rfl: 1 ?  escitalopram (LEXAPRO) 10 MG tablet, Take 10 mg by mouth daily. (Patient not taking: Reported on 06/24/2021), Disp: , Rfl:  ?  lidocaine-prilocaine (EMLA) cream, Apply to affected area once, Disp: 30 g, Rfl: 3 ?  LORazepam (ATIVAN) 0.5 MG tablet, Take 1 tablet (0.5 mg total) by mouth every 6 (six) hours as needed (Nausea or vomiting)., Disp: 30 tablet, Rfl: 0 ?  ondansetron (ZOFRAN) 8 MG tablet, Take 1 tablet (8 mg total) by mouth 2 (two) times daily as needed. Start on the third day after cisplatin chemotherapy., Disp: 30 tablet, Rfl: 1 ?  prochlorperazine (COMPAZINE) 10 MG tablet, Take 1 tablet (10 mg total) by mouth every 6 (six) hours as needed (Nausea or vomiting)., Disp: 30 tablet, Rfl: 1 ? ? ?Physical exam:  ?Vitals:  ? 06/24/21 0922  ?BP: 123/73  ?Pulse: 92  ?Resp: 18  ?Temp: 98.6 ?F (37 ?C)  ?SpO2: 100%  ?Weight: 209 lb 9.6 oz (95.1 kg)  ?Height: 5' 6.5" (1.689 m)  ? ?Physical Exam ?Constitutional:   ?   General: She is not in acute distress. ?Cardiovascular:  ?   Rate and Rhythm: Normal rate and regular rhythm.  ?   Heart sounds: Normal heart sounds.  ?Pulmonary:  ?   Effort: Pulmonary effort is normal.  ?   Breath sounds: Normal breath sounds.  ?Abdominal:  ?   General: Bowel sounds  are normal. There is no distension.  ?   Palpations: Abdomen is soft.  ?   Tenderness: There is no abdominal tenderness.  ?Skin: ?   General: Skin is warm and dry.  ?Neurological:  ?   Mental Status: She is alert and oriented to person, place, and time.  ?  ? ? ? ? ?  Latest Ref Rng & Units 11/17/2020  ?  7:23 AM  ?CMP  ?Glucose 70 - 99 mg/dL 116    ?BUN 6 - 20 mg/dL 16    ?Creatinine 0.44 - 1.00 mg/dL 0.99    ?Sodium 135 - 145 mmol/L 138    ?Potassium 3.5 - 5.1 mmol/L 3.9    ?Chloride 98 - 111 mmol/L 105    ?CO2 22 - 32 mmol/L 21    ?Calcium 8.9 - 10.3 mg/dL 9.5    ? ? ?  Latest Ref Rng & Units 11/17/2020  ?  7:23 AM  ?CBC  ?WBC 4.0 - 10.5 K/uL 5.9    ?Hemoglobin 12.0 - 15.0 g/dL 14.5    ?Hematocrit 36.0 - 46.0 % 40.6    ?Platelets 150 - 400 K/uL 331    ? ? ? ? ?MR PELVIS W WO CONTRAST ? ?Result Date: 06/12/2021 ?CLINICAL DATA:  Cervical cancer. EXAM: MRI PELVIS WITHOUT AND WITH CONTRAST TECHNIQUE: Multiplanar multisequence MR imaging of the pelvis was performed both before and after administration of intravenous contrast. CONTRAST:  30m GADAVIST GADOBUTROL 1 MMOL/ML IV SOLN COMPARISON:  CT scan 11/17/2020 FINDINGS: Urinary Tract: The bladder is normal. No bladder mass or asymmetric bladder wall thickening. No bladder calculi. The distal ureters are normal. Bowel: The rectum, sigmoid colon and visualized small bowel loops are unremarkable. Clear fat plane between the vagina and rectum without any evidence of rectal wall invasion. Vascular/Lymphatic: The vascular structures are normal. There are mildly enlarged bilateral pelvic sidewall lymph nodes worrisome for metastatic disease. Right-sided medial external iliac node on image number 28/20 measures 9 mm. Medial external iliac lymph node on image 23/20 measures 8 mm. Left-sided external iliac node on image 22/20 measures 7 mm. No mesorectal or sigmoid mesocolon adenopathy. No inguinal adenopathy. Reproductive: There is an enhancing cervical mass. This measures  approximately 4.2 x 3.7 x 2.9 cm. This involves the anterior aspect of the cervix involving the anterior cervical stroma and also likely involving the anterior aspect of the lower uterine segment. The tumor extends into

## 2021-06-30 ENCOUNTER — Ambulatory Visit
Admission: RE | Admit: 2021-06-30 | Discharge: 2021-06-30 | Disposition: A | Discharge status: Home / Self Care | Payer: BC Managed Care – PPO | Source: Ambulatory Visit | Attending: Oncology | Admitting: Oncology

## 2021-06-30 ENCOUNTER — Other Ambulatory Visit: Payer: Self-pay

## 2021-06-30 ENCOUNTER — Encounter: Payer: Self-pay | Admitting: Radiology

## 2021-06-30 DIAGNOSIS — Z452 Encounter for adjustment and management of vascular access device: Secondary | ICD-10-CM | POA: Diagnosis not present

## 2021-06-30 DIAGNOSIS — C539 Malignant neoplasm of cervix uteri, unspecified: Secondary | ICD-10-CM

## 2021-06-30 HISTORY — PX: IR IMAGING GUIDED PORT INSERTION: IMG5740

## 2021-06-30 MED ORDER — MIDAZOLAM HCL 2 MG/2ML IJ SOLN
INTRAMUSCULAR | Status: AC | PRN
  Administered 2021-06-30: 2 mg via INTRAVENOUS

## 2021-06-30 MED ORDER — FENTANYL CITRATE (PF) 100 MCG/2ML IJ SOLN
INTRAMUSCULAR | Status: AC
  Filled 2021-06-30: qty 2

## 2021-06-30 MED ORDER — SODIUM CHLORIDE 0.9 % IV SOLN
INTRAVENOUS | Status: DC
  Filled 2021-06-30: qty 1000

## 2021-06-30 MED ORDER — LIDOCAINE-EPINEPHRINE 1 %-1:100000 IJ SOLN
INTRAMUSCULAR | Status: AC
  Administered 2021-06-30: 12 mL
  Filled 2021-06-30: qty 1

## 2021-06-30 MED ORDER — HEPARIN SOD (PORK) LOCK FLUSH 100 UNIT/ML IV SOLN
INTRAVENOUS | Status: AC
  Administered 2021-06-30: 500 [IU]
  Filled 2021-06-30: qty 5

## 2021-06-30 MED ORDER — MIDAZOLAM HCL 2 MG/2ML IJ SOLN
INTRAMUSCULAR | Status: AC
  Filled 2021-06-30: qty 2

## 2021-06-30 MED ORDER — FENTANYL CITRATE (PF) 100 MCG/2ML IJ SOLN
INTRAMUSCULAR | Status: AC | PRN
  Administered 2021-06-30 (×2): 25 ug via INTRAVENOUS
  Administered 2021-06-30: 50 ug via INTRAVENOUS

## 2021-06-30 NOTE — Procedures (Signed)
Interventional Radiology Procedure Note ? ?Date of Procedure: 06/30/2021  ?Procedure: Port placement  ? ?Findings:  ?1. Port placement, right chest via right IJ  ? ?Complications: No immediate complications noted.  ? ?Estimated Blood Loss: minimal ? ?Follow-up and Recommendations: ?1. Bedrest 1 hour  ? ? ?Albin Felling, MD  ?Vascular & Interventional Radiology  ?06/30/2021 1:10 PM ? ? ? ?

## 2021-06-30 NOTE — H&P (Signed)
? ?Chief Complaint: ?Patient was seen in consultation today for port a catheter placement for chemotherapy at the request of Rao,Archana C ? ?Referring Physician(s): ?Rao,Archana C ? ?Supervising Physician: Juliet Rude ? ?Patient Status: [place] ? ?History of Present Illness: ?Dawn Camacho is a 31 y.o. female with PMHx significant for newly diagnosed cervical cancer who has been seen by oncology on 5/2 and is scheduled today for elective image guided port a catheter placement with moderate sedation. The patient has had a H&P performed within the last 30 days, all history, medications, and exam have been reviewed. The patient denies any interval changes since the H&P. ? ?The patient denies any current chest pain or shortness of breath. The patient denies any recent infections, fever or chills. She has no known complications to sedation.  ? ? ?Past Medical History:  ?Diagnosis Date  ? Allergic rhinitis   ? Allergy   ? Cervical cancer ([place])   ? ? ?Past Surgical History:  ?Procedure Laterality Date  ? TONSILECTOMY, ADENOIDECTOMY, BILATERAL MYRINGOTOMY AND TUBES Bilateral   ? TONSILLECTOMY    ? WISDOM TOOTH EXTRACTION    ? ? ?Allergies: ?Patient has no known allergies. ? ?Medications: ?Prior to Admission medications   ?Medication Sig Start Date End Date Taking? Authorizing Provider  ?aspirin-acetaminophen-caffeine (EXCEDRIN MIGRAINE) 250-250-65 MG tablet Take by mouth every 6 (six) hours as needed for headache.   Yes [provider]  ?dexamethasone (DECADRON) 4 MG tablet Take 2 tablets (8 mg total) by mouth daily. Take daily x 3 days starting the day after cisplatin chemotherapy. Take with food. 06/24/21   Sindy Guadeloupe, MD  ?escitalopram (LEXAPRO) 10 MG tablet Take 10 mg by mouth daily. ?Patient not taking: Reported on 06/24/2021 06/02/21   [provider]  ?etonogestrel-ethinyl estradiol (NUVARING) 0.12-0.015 MG/24HR vaginal ring Place vaginally. 06/04/21   [provider]   ?lidocaine-prilocaine (EMLA) cream Apply to affected area once 06/24/21   Sindy Guadeloupe, MD  ?LORazepam (ATIVAN) 0.5 MG tablet Take 1 tablet (0.5 mg total) by mouth every 6 (six) hours as needed (Nausea or vomiting). 06/24/21   Sindy Guadeloupe, MD  ?ondansetron (ZOFRAN) 8 MG tablet Take 1 tablet (8 mg total) by mouth 2 (two) times daily as needed. Start on the third day after cisplatin chemotherapy. 06/24/21   Sindy Guadeloupe, MD  ?prochlorperazine (COMPAZINE) 10 MG tablet Take 1 tablet (10 mg total) by mouth every 6 (six) hours as needed (Nausea or vomiting). 06/24/21   Sindy Guadeloupe, MD  ?  ? ?Family History  ?Problem Relation Age of Onset  ? Heart disease Father   ? Breast cancer Maternal Grandmother   ? Lung cancer Paternal Grandmother   ? Heart attack Paternal Grandfather   ? ? ?Social History  ? ?Socioeconomic History  ? Marital status: Married  ?  Spouse name: Not on file  ? Number of children: Not on file  ? Years of education: Not on file  ? Highest education level: Not on file  ?Occupational History  ? Not on file  ?Tobacco Use  ? Smoking status: Never  ? Smokeless tobacco: Never  ?Vaping Use  ? Vaping Use: Never used  ?Substance and Sexual Activity  ? Alcohol use: Yes  ?  Alcohol/week: 1.0 standard drink  ?  Types: 1 Glasses of wine per week  ?  Comment: occasional--every couple of weeks.  ? Drug use: No  ? Sexual activity: Yes  ?Other Topics Concern  ?  Not on file  ?Social History Narrative  ? Not on file  ? ?Social Determinants of Health  ? ?Financial Resource Strain: Not on file  ?Food Insecurity: Not on file  ?Transportation Needs: Not on file  ?Physical Activity: Not on file  ?Stress: Not on file  ?Social Connections: Not on file  ? ?Review of Systems: A 12 point ROS discussed and pertinent positives are indicated in the HPI above.  All other systems are negative. ? ?Review of Systems ? ?Vital Signs: ?BP 127/72   Pulse 80   Temp 98.1 ?F (36.7 ?C) (Oral)   Resp 19   Ht '5\' 6"'$  (1.676 m)   Wt 210 lb (95.3  kg)   SpO2 98%   BMI 33.89 kg/m?  ? ?Physical Exam ?Constitutional:   ?   General: She is not in acute distress. ?HENT:  ?   Head: Normocephalic and atraumatic.  ?Cardiovascular:  ?   Rate and Rhythm: Normal rate and regular rhythm.  ?Pulmonary:  ?   Effort: Pulmonary effort is normal. No respiratory distress.  ?Neurological:  ?   Mental Status: She is alert and oriented to person, place, and time.  ? ? ?Imaging: ?MR PELVIS W WO CONTRAST ? ?Result Date: 06/12/2021 ?CLINICAL DATA:  Cervical cancer. EXAM: MRI PELVIS WITHOUT AND WITH CONTRAST TECHNIQUE: Multiplanar multisequence MR imaging of the pelvis was performed both before and after administration of intravenous contrast. CONTRAST:  63m GADAVIST GADOBUTROL 1 MMOL/ML IV SOLN COMPARISON:  CT scan 11/17/2020 FINDINGS: Urinary Tract: The bladder is normal. No bladder mass or asymmetric bladder wall thickening. No bladder calculi. The distal ureters are normal. Bowel: The rectum, sigmoid colon and visualized small bowel loops are unremarkable. Clear fat plane between the vagina and rectum without any evidence of rectal wall invasion. Vascular/Lymphatic: The vascular structures are normal. There are mildly enlarged bilateral pelvic sidewall lymph nodes worrisome for metastatic disease. Right-sided medial external iliac node on image number 28/20 measures 9 mm. Medial external iliac lymph node on image 23/20 measures 8 mm. Left-sided external iliac node on image 22/20 measures 7 mm. No mesorectal or sigmoid mesocolon adenopathy. No inguinal adenopathy. Reproductive: There is an enhancing cervical mass. This measures approximately 4.2 x 3.7 x 2.9 cm. This involves the anterior aspect of the cervix involving the anterior cervical stroma and also likely involving the anterior aspect of the lower uterine segment. The tumor extends into the vagina mainly on the left side. I do not see any definite involvement of the endocervical canal or the endometrial canal. The uterine  body is otherwise normal. Both ovaries are normal. Other:  Small amount of free pelvic fluid is likely physiologic. Musculoskeletal: The bony structures are unremarkable. The hip and pelvic musculature is unremarkable. IMPRESSION: 1. 4.2 x 3.7 x 2.9 cm cervical mass involving the anterior aspect of the cervix and also likely involving the anterior aspect of the lower uterine segment. This extends into the vagina mainly on the left side. No MR findings to suggest involvement of the bladder, urethra rectum. 2. Borderline enlarged bilateral pelvic sidewall lymph nodes worrisome for metastatic disease. Electronically Signed   By: PMarijo SanesM.D.   On: 06/12/2021 11:04  ? ?NM PET Image Initial (PI) Skull Base To Thigh ? ?Result Date: 06/19/2021 ?CLINICAL DATA:  Initial treatment strategy for cervical cancer. EXAM: NUCLEAR MEDICINE PET SKULL BASE TO THIGH TECHNIQUE: 9.9 mCi F-18 FDG was injected intravenously. Full-ring PET imaging was performed from the skull base to thigh after the  radiotracer. CT data was obtained and used for attenuation correction and anatomic localization. Fasting blood glucose: 79 mg/dl COMPARISON:  None. FINDINGS: Mediastinal blood pool activity: SUV max 2.6 Liver activity: SUV max 3.1 NECK: No hypermetabolic lymph nodes in the neck. Incidental CT findings: None. CHEST: No hypermetabolic mediastinal or hilar nodes. No suspicious pulmonary nodules on the CT scan. Incidental CT findings: Soft tissue in the anterior mediastinum, likely residual thymus. Bilateral symmetric posterior cervical, neck base, and thoracic paravertebral FDG uptake with no associated soft tissue lesions, likely due to brown fat. ABDOMEN/PELVIS: Hypermetabolic subcentimeter bilateral pelvic lymph nodes reference right external iliac lymph node measuring 8 mm on series 3, image 224 with an SUV max of 4.2. Hypermetabolic left inguinal lymph node measuring 8 mm on series 3, image 237. Hypermetabolic activity of the cervix with  an SUV max of 15.45. No abnormal hypermetabolic activity within the liver, pancreas, adrenal glands, or spleen. Incidental CT findings: Bilateral punctate nonobstructing renal stones. SKELETON: No focal hypermeta

## 2021-07-01 ENCOUNTER — Inpatient Hospital Stay: Payer: BC Managed Care – PPO

## 2021-07-01 ENCOUNTER — Inpatient Hospital Stay: Payer: BC Managed Care – PPO | Admitting: Oncology

## 2021-07-01 ENCOUNTER — Other Ambulatory Visit: Payer: Self-pay | Admitting: Oncology

## 2021-07-02 ENCOUNTER — Encounter: Payer: Self-pay | Admitting: Oncology

## 2021-07-02 ENCOUNTER — Telehealth: Payer: Self-pay

## 2021-07-02 ENCOUNTER — Other Ambulatory Visit: Payer: BC Managed Care – PPO

## 2021-07-02 DIAGNOSIS — C539 Malignant neoplasm of cervix uteri, unspecified: Secondary | ICD-10-CM

## 2021-07-02 NOTE — Progress Notes (Signed)
Tumor Board Documentation ? ?Dawn Camacho was presented by Dr. Janese Camacho at our Tumor Board on 07/02/2021, which included representatives from medical oncology, radiation oncology, surgical oncology, pathology, navigation, genetics, palliative care. ? ?Dawn Camacho currently presents as a new patient, for [name], for new tumor(s) with history of the following treatments: none. ? ?Additionally, we reviewed previous medical and familial history, history of present illness, and recent lab results along with all available histopathologic and imaging studies. The tumor board considered available treatment options and made the following recommendations: ?Concurrent chemo-radiation therapy ?  ? ?The following procedures/referrals were also placed: No orders of the defined types were placed in this encounter. ? ? ?Clinical Trial Status:    ? ?Staging used:  (T3cN1) ? ?National site-specific guidelines   were discussed with respect to the case. ? ?Tumor board is a meeting of clinicians from various specialty areas who evaluate and discuss patients for whom a multidisciplinary approach is being considered. Final determinations in the plan of care are those of the provider(s). The responsibility for follow up of recommendations given during tumor board is that of the provider.  ? ?Today?s extended care, comprehensive team conference, Dawn Camacho was not present for the discussion and was not examined.  ? ?

## 2021-07-02 NOTE — Telephone Encounter (Signed)
duplicate

## 2021-07-02 NOTE — Telephone Encounter (Signed)
Research nurse called the patient to review the MT 3505-A protocol blood study this am. The patient is very receptive to hearing about the protocol, reviewed the procedures, benefits, risks and alternatives briefly with her. The patient wants to think about it until tomorrow, she requested a phone call in the afternoon. Research nurse will call her back then for planning if she is interested. The patient was informed that her participation is strictly voluntary and she does not have to participate if she doesn't want to.  ?Jeral Fruit, RN ?07/02/21 ?11:42 AM ? ?

## 2021-07-03 ENCOUNTER — Ambulatory Visit
Admission: RE | Admit: 2021-07-03 | Discharge: 2021-07-03 | Disposition: A | Discharge status: Home / Self Care | Payer: BC Managed Care – PPO | Source: Ambulatory Visit | Attending: Radiation Oncology | Admitting: Radiation Oncology

## 2021-07-03 DIAGNOSIS — Z510 Encounter for antineoplastic radiation therapy: Secondary | ICD-10-CM | POA: Diagnosis not present

## 2021-07-03 DIAGNOSIS — C531 Malignant neoplasm of exocervix: Secondary | ICD-10-CM | POA: Diagnosis not present

## 2021-07-04 MED FILL — Dexamethasone Sodium Phosphate Inj 100 MG/10ML: INTRAMUSCULAR | Qty: 1 | Status: AC

## 2021-07-04 MED FILL — Fosaprepitant Dimeglumine For IV Infusion 150 MG (Base Eq): INTRAVENOUS | Qty: 5 | Status: AC

## 2021-07-07 ENCOUNTER — Inpatient Hospital Stay: Payer: BC Managed Care – PPO

## 2021-07-07 ENCOUNTER — Encounter: Payer: Self-pay | Admitting: Oncology

## 2021-07-07 ENCOUNTER — Encounter: Payer: Self-pay | Admitting: *Deleted

## 2021-07-07 ENCOUNTER — Inpatient Hospital Stay (HOSPITAL_BASED_OUTPATIENT_CLINIC_OR_DEPARTMENT_OTHER): Payer: BC Managed Care – PPO | Admitting: Oncology

## 2021-07-07 VITALS — BP 119/85 | HR 86 | Temp 98.4°F | Resp 18 | Wt 210.4 lb

## 2021-07-07 DIAGNOSIS — C531 Malignant neoplasm of exocervix: Secondary | ICD-10-CM | POA: Diagnosis not present

## 2021-07-07 DIAGNOSIS — C539 Malignant neoplasm of cervix uteri, unspecified: Secondary | ICD-10-CM

## 2021-07-07 DIAGNOSIS — Z5111 Encounter for antineoplastic chemotherapy: Secondary | ICD-10-CM | POA: Diagnosis not present

## 2021-07-07 DIAGNOSIS — Z510 Encounter for antineoplastic radiation therapy: Secondary | ICD-10-CM | POA: Diagnosis not present

## 2021-07-07 LAB — CBC WITH DIFFERENTIAL/PLATELET
Abs Immature Granulocytes: 0.02 10*3/uL (ref 0.00–0.07)
Basophils Absolute: 0.1 10*3/uL (ref 0.0–0.1)
Basophils Relative: 1 %
Eosinophils Absolute: 0.3 10*3/uL (ref 0.0–0.5)
Eosinophils Relative: 3 %
HCT: 37.3 % (ref 36.0–46.0)
Hemoglobin: 13.2 g/dL (ref 12.0–15.0)
Immature Granulocytes: 0 %
Lymphocytes Relative: 24 %
Lymphs Abs: 2 10*3/uL (ref 0.7–4.0)
MCH: 31.8 pg (ref 26.0–34.0)
MCHC: 35.4 g/dL (ref 30.0–36.0)
MCV: 89.9 fL (ref 80.0–100.0)
Monocytes Absolute: 0.8 10*3/uL (ref 0.1–1.0)
Monocytes Relative: 10 %
Neutro Abs: 5.2 10*3/uL (ref 1.7–7.7)
Neutrophils Relative %: 62 %
Platelets: 283 10*3/uL (ref 150–400)
RBC: 4.15 MIL/uL (ref 3.87–5.11)
RDW: 11.4 % — ABNORMAL LOW (ref 11.5–15.5)
WBC: 8.4 10*3/uL (ref 4.0–10.5)
nRBC: 0 % (ref 0.0–0.2)

## 2021-07-07 LAB — BASIC METABOLIC PANEL
Anion gap: 9 (ref 5–15)
BUN: 13 mg/dL (ref 6–20)
CO2: 24 mmol/L (ref 22–32)
Calcium: 9.5 mg/dL (ref 8.9–10.3)
Chloride: 105 mmol/L (ref 98–111)
Creatinine, Ser: 0.69 mg/dL (ref 0.44–1.00)
GFR, Estimated: >60 mL/min (ref >60)
Glucose, Bld: 101 mg/dL — ABNORMAL HIGH (ref 70–99)
Potassium: 3.9 mmol/L (ref 3.5–5.1)
Sodium: 138 mmol/L (ref 135–145)

## 2021-07-07 LAB — MAGNESIUM: Magnesium: 1.8 mg/dL (ref 1.7–2.4)

## 2021-07-07 MED ORDER — HEPARIN SOD (PORK) LOCK FLUSH 100 UNIT/ML IV SOLN
500.0000 [IU] | Freq: Once | INTRAVENOUS | Status: AC
  Administered 2021-07-07: 500 [IU] via INTRAVENOUS
  Filled 2021-07-07: qty 5

## 2021-07-07 MED ORDER — HEPARIN SOD (PORK) LOCK FLUSH 100 UNIT/ML IV SOLN
500.0000 [IU] | Freq: Once | INTRAVENOUS | Status: AC | PRN
  Administered 2021-07-07: 500 [IU]
  Filled 2021-07-07: qty 5

## 2021-07-07 MED ORDER — MAGNESIUM SULFATE 2 GM/50ML IV SOLN
2.0000 g | Freq: Once | INTRAVENOUS | Status: AC
  Administered 2021-07-07: 2 g via INTRAVENOUS
  Filled 2021-07-07: qty 50

## 2021-07-07 MED ORDER — SODIUM CHLORIDE 0.9 % IV SOLN
40.0000 mg/m2 | Freq: Once | INTRAVENOUS | Status: AC
  Administered 2021-07-07: 84 mg via INTRAVENOUS
  Filled 2021-07-07: qty 84

## 2021-07-07 MED ORDER — POTASSIUM CHLORIDE IN NACL 20-0.9 MEQ/L-% IV SOLN
Freq: Once | INTRAVENOUS | Status: AC
  Filled 2021-07-07: qty 1000

## 2021-07-07 MED ORDER — SODIUM CHLORIDE 0.9 % IV SOLN
Freq: Once | INTRAVENOUS | Status: AC
  Filled 2021-07-07: qty 250

## 2021-07-07 MED ORDER — PALONOSETRON HCL INJECTION 0.25 MG/5ML
0.2500 mg | Freq: Once | INTRAVENOUS | Status: AC
  Administered 2021-07-07: 0.25 mg via INTRAVENOUS
  Filled 2021-07-07: qty 5

## 2021-07-07 MED ORDER — SODIUM CHLORIDE 0.9% FLUSH
10.0000 mL | INTRAVENOUS | Status: AC | PRN
  Administered 2021-07-07: 10 mL via INTRAVENOUS
  Filled 2021-07-07: qty 10

## 2021-07-07 MED ORDER — SODIUM CHLORIDE 0.9 % IV SOLN
10.0000 mg | Freq: Once | INTRAVENOUS | Status: AC
  Administered 2021-07-07: 10 mg via INTRAVENOUS
  Filled 2021-07-07: qty 10

## 2021-07-07 MED ORDER — SODIUM CHLORIDE 0.9 % IV SOLN
150.0000 mg | Freq: Once | INTRAVENOUS | Status: AC
  Administered 2021-07-07: 150 mg via INTRAVENOUS
  Filled 2021-07-07: qty 150

## 2021-07-07 NOTE — Progress Notes (Signed)
? ? ? ?Hematology/Oncology Consult note ?[place]  ?[phone] [phone] ? ?Patient Care Team: ?Pcp, No as PCP - General ?Patient, No Pcp Per (Inactive) (General Practice)  ? ?Name of the patient: Dawn Camacho  ?[id number]  ?October 04, 1990  ? ?Date of visit: 07/07/21 ? 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 ?Chief complaint/ Reason for visit-on treatment assessment prior to cycle 1 of weekly cisplatin chemotherapy concurrently with radiation ? ?Heme/Onc history: is a 31 year old female who was found to have postcoital bleeding that was ongoing for about 2 months.  She was seen by GYN and had a cervical exam which showed a large pink friable vascularized irregular growth in the cervix extending from 11:00 to 5 o'clock position.  Pap showed ASCUS and high risk HPV.  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  She is married and uses NuvaRing for contraception.  She does not have any children yet.  She has had 2 Gardasil vaccinations in the past but did not receive a booster patient was seen by GYN oncology Dr. Fransisca Connors and plan was to obtain a PET CT scan to decide if she would be a candidate for surgery.  PET scan showed hypermetabolic activity of the cervix compatible with primary malignancy.  Hypermetabolic subcentimeter bilateral external iliac lymph nodes concerning for metastatic disease.  Hypermetabolic left inguinal lymph node concerning for additional site of metastatic disease.  Bilateral nonobstructing renal stones. ? ?Interval history-patient is doing well and denies any specific complaints at this time ? ?ECOG PS- 0 ?Pain scale- 0 ? ? ?Review of systems- Review of Systems  ?Constitutional:  Negative for chills, fever, malaise/fatigue and weight loss.  ?HENT:  Negative for congestion, ear discharge and nosebleeds.   ?Eyes:  Negative for blurred vision.   ?Respiratory:  Negative for cough, hemoptysis, sputum production, shortness of breath and wheezing.   ?Cardiovascular:  Negative for chest pain, palpitations, orthopnea and claudication.  ?Gastrointestinal:  Negative for abdominal pain, blood in stool, constipation, diarrhea, heartburn, melena, nausea and vomiting.  ?Genitourinary:  Negative for dysuria, flank pain, frequency, hematuria and urgency.  ?Musculoskeletal:  Negative for back pain, joint pain and myalgias.  ?Skin:  Negative for rash.  ?Neurological:  Negative for dizziness, tingling, focal weakness, seizures, weakness and headaches.  ?Endo/Heme/Allergies:  Does not bruise/bleed easily.  ?Psychiatric/Behavioral:  Negative for depression and suicidal ideas. The patient does not have insomnia.    ? ? ?No Known Allergies ? ? ?Past Medical History:  ?Diagnosis Date  ? Allergic rhinitis   ? Allergy   ? Cervical cancer ([place])   ? ? ? ?Past Surgical History:  ?Procedure Laterality Date  ? IR IMAGING GUIDED PORT INSERTION  06/30/2021  ? TONSILECTOMY, ADENOIDECTOMY, BILATERAL MYRINGOTOMY AND TUBES Bilateral   ? TONSILLECTOMY    ? WISDOM TOOTH EXTRACTION    ? ? ?Social History  ? ?Socioeconomic History  ? Marital status: Married  ?  Spouse name: Not on file  ? Number of children: Not on file  ? Years of education: Not on file  ? Highest education level: Not on file  ?Occupational History  ? Not on file  ?Tobacco Use  ? Smoking status: Never  ? Smokeless tobacco: Never  ?Vaping Use  ? Vaping Use: Never used  ?Substance and Sexual Activity  ? Alcohol use: Yes  ?  Alcohol/week: 1.0 standard drink  ?  Types: 1 Glasses of wine per  week  ?  Comment: occasional--every couple of weeks.  ? Drug use: No  ? Sexual activity: Yes  ?Other Topics Concern  ? Not on file  ?Social History Narrative  ? Not on file  ? ?Social Determinants of Health  ? ?Financial Resource Strain: Not on file  ?Food Insecurity: Not on file  ?Transportation Needs: Not on file  ?Physical Activity: Not on  file  ?Stress: Not on file  ?Social Connections: Not on file  ?Intimate Partner Violence: Not on file  ? ? ?Family History  ?Problem Relation Age of Onset  ? Heart disease Father   ? Breast cancer Maternal Grandmother   ? Lung cancer Paternal Grandmother   ? Heart attack Paternal Grandfather   ? ? ? ?Current Outpatient Medications:  ?  aspirin-acetaminophen-caffeine (EXCEDRIN MIGRAINE) 250-250-65 MG tablet, Take by mouth every 6 (six) hours as needed for headache., Disp: , Rfl:  ?  lidocaine-prilocaine (EMLA) cream, Apply to affected area once, Disp: 30 g, Rfl: 3 ?  dexamethasone (DECADRON) 4 MG tablet, Take 2 tablets (8 mg total) by mouth daily. Take daily x 3 days starting the day after cisplatin chemotherapy. Take with food. (Patient not taking: Reported on 07/07/2021), Disp: 30 tablet, Rfl: 1 ?  escitalopram (LEXAPRO) 10 MG tablet, Take 10 mg by mouth daily. (Patient not taking: Reported on 06/24/2021), Disp: , Rfl:  ?  etonogestrel-ethinyl estradiol (NUVARING) 0.12-0.015 MG/24HR vaginal ring, Place vaginally. (Patient not taking: Reported on 07/07/2021), Disp: , Rfl:  ?  LORazepam (ATIVAN) 0.5 MG tablet, Take 1 tablet (0.5 mg total) by mouth every 6 (six) hours as needed (Nausea or vomiting). (Patient not taking: Reported on 07/07/2021), Disp: 30 tablet, Rfl: 0 ?  ondansetron (ZOFRAN) 8 MG tablet, Take 1 tablet (8 mg total) by mouth 2 (two) times daily as needed. Start on the third day after cisplatin chemotherapy. (Patient not taking: Reported on 07/07/2021), Disp: 30 tablet, Rfl: 1 ?  prochlorperazine (COMPAZINE) 10 MG tablet, Take 1 tablet (10 mg total) by mouth every 6 (six) hours as needed (Nausea or vomiting). (Patient not taking: Reported on 07/07/2021), Disp: 30 tablet, Rfl: 1 ?No current facility-administered medications for this visit. ? ?Facility-Administered Medications Ordered in Other Visits:  ?  CISplatin (PLATINOL) 84 mg in sodium chloride 0.9 % 250 mL chemo infusion, 40 mg/m2 (Treatment Plan  Recorded), Intravenous, Once, Sindy Guadeloupe, MD ?  dexamethasone (DECADRON) 10 mg in sodium chloride 0.9 % 50 mL IVPB, 10 mg, Intravenous, Once, Sindy Guadeloupe, MD, Last Rate: 204 mL/hr at 07/07/21 1247, 10 mg at 07/07/21 1247 ?  heparin lock flush 100 unit/mL, 500 Units, Intravenous, Once, Sindy Guadeloupe, MD ?  heparin lock flush 100 unit/mL, 500 Units, Intracatheter, Once PRN, Sindy Guadeloupe, MD ?  sodium chloride flush (NS) 0.9 % injection 10 mL, 10 mL, Intravenous, PRN, Sindy Guadeloupe, MD, 10 mL at 07/07/21 0841 ? ?Physical exam:  ?Vitals:  ? 07/07/21 0854  ?BP: 119/85  ?Pulse: 86  ?Resp: 18  ?Temp: 98.4 ?F (36.9 ?C)  ?SpO2: 99%  ?Weight: 210 lb 6.4 oz (95.4 kg)  ? ?Physical Exam ?Constitutional:   ?   General: She is not in acute distress. ?Cardiovascular:  ?   Rate and Rhythm: Normal rate and regular rhythm.  ?   Heart sounds: Normal heart sounds.  ?Pulmonary:  ?   Effort: Pulmonary effort is normal.  ?   Breath sounds: Normal breath sounds.  ?Skin: ?   General: Skin is  warm and dry.  ?Neurological:  ?   Mental Status: She is alert and oriented to person, place, and time.  ?  ? ? ?  Latest Ref Rng & Units 07/07/2021  ?  8:30 AM  ?CMP  ?Glucose 70 - 99 mg/dL 101    ?BUN 6 - 20 mg/dL 13    ?Creatinine 0.44 - 1.00 mg/dL 0.69    ?Sodium 135 - 145 mmol/L 138    ?Potassium 3.5 - 5.1 mmol/L 3.9    ?Chloride 98 - 111 mmol/L 105    ?CO2 22 - 32 mmol/L 24    ?Calcium 8.9 - 10.3 mg/dL 9.5    ? ? ?  Latest Ref Rng & Units 07/07/2021  ?  8:30 AM  ?CBC  ?WBC 4.0 - 10.5 K/uL 8.4    ?Hemoglobin 12.0 - 15.0 g/dL 13.2    ?Hematocrit 36.0 - 46.0 % 37.3    ?Platelets 150 - 400 K/uL 283    ? ? ?No images are attached to the encounter. ? ?MR PELVIS W WO CONTRAST ? ?Result Date: 06/12/2021 ?CLINICAL DATA:  Cervical cancer. EXAM: MRI PELVIS WITHOUT AND WITH CONTRAST TECHNIQUE: Multiplanar multisequence MR imaging of the pelvis was performed both before and after administration of intravenous contrast. CONTRAST:  40m GADAVIST GADOBUTROL  1 MMOL/ML IV SOLN COMPARISON:  CT scan 11/17/2020 FINDINGS: Urinary Tract: The bladder is normal. No bladder mass or asymmetric bladder wall thickening. No bladder calculi. The distal ureters are normal. Bowel:

## 2021-07-07 NOTE — Patient Instructions (Signed)
[facility] AT [place]  Discharge Instructions: ?Thank you for choosing [facility] to provide your oncology and hematology care.  ?If you have a lab appointment with the [facility], please go directly to the [facility] and check in at the registration area. ? ?Wear comfortable clothing and clothing appropriate for easy access to any Portacath or PICC line.  ? ?We strive to give you quality time with your provider. You may need to reschedule your appointment if you arrive late (15 or more minutes).  Arriving late affects you and other patients whose appointments are after yours.  Also, if you miss three or more appointments without notifying the office, you may be dismissed from the clinic at the provider?s discretion.    ?  ?For prescription refill requests, have your pharmacy contact our office and allow 72 hours for refills to be completed.   ? ?Today you received the following chemotherapy and/or immunotherapy agents: Cisplatin ?    ?  ?To help prevent nausea and vomiting after your treatment, we encourage you to take your nausea medication as directed. ? ?BELOW ARE SYMPTOMS THAT SHOULD BE REPORTED IMMEDIATELY: ?*FEVER GREATER THAN 100.4 F (38 ?C) OR HIGHER ?*CHILLS OR SWEATING ?*NAUSEA AND VOMITING THAT IS NOT CONTROLLED WITH YOUR NAUSEA MEDICATION ?*UNUSUAL SHORTNESS OF BREATH ?*UNUSUAL BRUISING OR BLEEDING ?*URINARY PROBLEMS (pain or burning when urinating, or frequent urination) ?*BOWEL PROBLEMS (unusual diarrhea, constipation, pain near the anus) ?TENDERNESS IN MOUTH AND THROAT WITH OR WITHOUT PRESENCE OF ULCERS (sore throat, sores in mouth, or a toothache) ?UNUSUAL RASH, SWELLING OR PAIN  ?UNUSUAL VAGINAL DISCHARGE OR ITCHING  ? ?Items with * indicate a potential emergency and should be followed up as soon as possible or go to the Emergency Department if any problems should occur. ? ?Please show the CHEMOTHERAPY ALERT CARD or IMMUNOTHERAPY ALERT CARD at check-in  to the Emergency Department and triage nurse. ? ?Should you have questions after your visit or need to cancel or reschedule your appointment, please contact [facility] [place] AT [place]  [phone] and follow the prompts.  Office hours are 8:00 a.m. to 4:30 p.m. Monday - Friday. Please note that voicemails left after 4:00 p.m. may not be returned until the following business day.  We are closed weekends and major holidays. You have access to a nurse at all times for urgent questions. Please call the main number to the clinic [phone] and follow the prompts. ? ?For any non-urgent questions, you may also contact your provider using MyChart. We now offer e-Visits for anyone 43 and older to request care online for non-urgent symptoms. For details visit [email or web address]. ?  ?Also download the MyChart app! Go to the app store, search "MyChart", open the app, select [facility], and log in with your MyChart username and password. ? ?Due to Covid, a mask is required upon entering the hospital/clinic. If you do not have a mask, one will be given to you upon arrival. For doctor visits, patients may have 1 support person aged 55 or older with them. For treatment visits, patients cannot have anyone with them due to current Covid guidelines and our immunocompromised population.  ?

## 2021-07-08 ENCOUNTER — Ambulatory Visit: Admission: RE | Admit: 2021-07-08 | Payer: BC Managed Care – PPO | Source: Ambulatory Visit

## 2021-07-08 ENCOUNTER — Telehealth: Payer: Self-pay

## 2021-07-08 ENCOUNTER — Encounter: Payer: Self-pay | Admitting: Oncology

## 2021-07-08 NOTE — Telephone Encounter (Signed)
FMLA paperwork has been done and a copies were made. Paperwork was taken downstairs to [facility] pt to pick up. Pt is aware of completed paperwork. ?

## 2021-07-08 NOTE — Telephone Encounter (Signed)
Telephone call to patient for follow up after receiving first infusion.   Patient states infusion went great.  Complimenting [place] on our wonderful "hospitality".  States eating good and drinking plenty of fluids.   Denies any nausea or vomiting.  Encouraged patient to call for any concerns or questions.  ?

## 2021-07-09 ENCOUNTER — Encounter: Payer: Self-pay | Admitting: Oncology

## 2021-07-09 ENCOUNTER — Other Ambulatory Visit: Payer: Self-pay

## 2021-07-09 ENCOUNTER — Ambulatory Visit
Admission: RE | Admit: 2021-07-09 | Discharge: 2021-07-09 | Disposition: A | Discharge status: Home / Self Care | Payer: BC Managed Care – PPO | Source: Ambulatory Visit | Attending: Radiation Oncology | Admitting: Radiation Oncology

## 2021-07-09 DIAGNOSIS — Z510 Encounter for antineoplastic radiation therapy: Secondary | ICD-10-CM | POA: Diagnosis not present

## 2021-07-09 DIAGNOSIS — C531 Malignant neoplasm of exocervix: Secondary | ICD-10-CM | POA: Diagnosis not present

## 2021-07-09 LAB — RAD ONC ARIA SESSION SUMMARY
Course Elapsed Days: 0
Plan Fractions Treated to Date: 1
Plan Prescribed Dose Per Fraction: 2 Gy
Plan Total Fractions Prescribed: 30
Plan Total Prescribed Dose: 60 Gy
Reference Point Dosage Given to Date: 2 Gy
Reference Point Session Dosage Given: 2 Gy
Session Number: 1

## 2021-07-10 ENCOUNTER — Other Ambulatory Visit: Payer: Self-pay

## 2021-07-10 ENCOUNTER — Ambulatory Visit
Admission: RE | Admit: 2021-07-10 | Discharge: 2021-07-10 | Disposition: A | Discharge status: Home / Self Care | Payer: BC Managed Care – PPO | Source: Ambulatory Visit | Attending: Radiation Oncology | Admitting: Radiation Oncology

## 2021-07-10 DIAGNOSIS — Z510 Encounter for antineoplastic radiation therapy: Secondary | ICD-10-CM | POA: Diagnosis not present

## 2021-07-10 DIAGNOSIS — C531 Malignant neoplasm of exocervix: Secondary | ICD-10-CM | POA: Diagnosis not present

## 2021-07-10 LAB — RAD ONC ARIA SESSION SUMMARY
Course Elapsed Days: 1
Plan Fractions Treated to Date: 2
Plan Prescribed Dose Per Fraction: 2 Gy
Plan Total Fractions Prescribed: 30
Plan Total Prescribed Dose: 60 Gy
Reference Point Dosage Given to Date: 4 Gy
Reference Point Session Dosage Given: 2 Gy
Session Number: 2

## 2021-07-11 ENCOUNTER — Ambulatory Visit
Admission: RE | Admit: 2021-07-11 | Discharge: 2021-07-11 | Disposition: A | Discharge status: Home / Self Care | Payer: BC Managed Care – PPO | Source: Ambulatory Visit | Attending: Radiation Oncology | Admitting: Radiation Oncology

## 2021-07-11 ENCOUNTER — Other Ambulatory Visit: Payer: Self-pay

## 2021-07-11 DIAGNOSIS — Z510 Encounter for antineoplastic radiation therapy: Secondary | ICD-10-CM | POA: Diagnosis not present

## 2021-07-11 DIAGNOSIS — C531 Malignant neoplasm of exocervix: Secondary | ICD-10-CM | POA: Diagnosis not present

## 2021-07-11 LAB — RAD ONC ARIA SESSION SUMMARY
Course Elapsed Days: 2
Plan Fractions Treated to Date: 3
Plan Prescribed Dose Per Fraction: 2 Gy
Plan Total Fractions Prescribed: 30
Plan Total Prescribed Dose: 60 Gy
Reference Point Dosage Given to Date: 6 Gy
Reference Point Session Dosage Given: 2 Gy
Session Number: 3

## 2021-07-14 ENCOUNTER — Other Ambulatory Visit: Payer: Self-pay

## 2021-07-14 ENCOUNTER — Ambulatory Visit
Admission: RE | Admit: 2021-07-14 | Discharge: 2021-07-14 | Disposition: A | Discharge status: Home / Self Care | Payer: BC Managed Care – PPO | Source: Ambulatory Visit | Attending: Radiation Oncology | Admitting: Radiation Oncology

## 2021-07-14 ENCOUNTER — Inpatient Hospital Stay: Payer: BC Managed Care – PPO

## 2021-07-14 DIAGNOSIS — Z510 Encounter for antineoplastic radiation therapy: Secondary | ICD-10-CM | POA: Diagnosis not present

## 2021-07-14 DIAGNOSIS — C539 Malignant neoplasm of cervix uteri, unspecified: Secondary | ICD-10-CM

## 2021-07-14 DIAGNOSIS — C531 Malignant neoplasm of exocervix: Secondary | ICD-10-CM | POA: Diagnosis not present

## 2021-07-14 DIAGNOSIS — Z95828 Presence of other vascular implants and grafts: Secondary | ICD-10-CM

## 2021-07-14 LAB — CBC WITH DIFFERENTIAL/PLATELET
Abs Immature Granulocytes: 0.08 10*3/uL — ABNORMAL HIGH (ref 0.00–0.07)
Basophils Absolute: 0.1 10*3/uL (ref 0.0–0.1)
Basophils Relative: 1 %
Eosinophils Absolute: 0.2 10*3/uL (ref 0.0–0.5)
Eosinophils Relative: 2 %
HCT: 38.6 % (ref 36.0–46.0)
Hemoglobin: 13.7 g/dL (ref 12.0–15.0)
Immature Granulocytes: 1 %
Lymphocytes Relative: 14 %
Lymphs Abs: 1.2 10*3/uL (ref 0.7–4.0)
MCH: 31.8 pg (ref 26.0–34.0)
MCHC: 35.5 g/dL (ref 30.0–36.0)
MCV: 89.6 fL (ref 80.0–100.0)
Monocytes Absolute: 0.6 10*3/uL (ref 0.1–1.0)
Monocytes Relative: 7 %
Neutro Abs: 6.5 10*3/uL (ref 1.7–7.7)
Neutrophils Relative %: 75 %
Platelets: 243 10*3/uL (ref 150–400)
RBC: 4.31 MIL/uL (ref 3.87–5.11)
RDW: 11.5 % (ref 11.5–15.5)
WBC: 8.6 10*3/uL (ref 4.0–10.5)
nRBC: 0 % (ref 0.0–0.2)

## 2021-07-14 LAB — RAD ONC ARIA SESSION SUMMARY
Course Elapsed Days: 5
Plan Fractions Treated to Date: 1
Plan Prescribed Dose Per Fraction: 2 Gy
Plan Total Fractions Prescribed: 27
Plan Total Prescribed Dose: 54 Gy
Reference Point Dosage Given to Date: 8 Gy
Reference Point Session Dosage Given: 2 Gy
Session Number: 4

## 2021-07-14 LAB — MAGNESIUM: Magnesium: 1.8 mg/dL (ref 1.7–2.4)

## 2021-07-14 LAB — COMPREHENSIVE METABOLIC PANEL
ALT: 67 U/L — ABNORMAL HIGH (ref 0–44)
AST: 33 U/L (ref 15–41)
Albumin: 3.5 g/dL (ref 3.5–5.0)
Alkaline Phosphatase: 82 U/L (ref 38–126)
Anion gap: 6 (ref 5–15)
BUN: 10 mg/dL (ref 6–20)
CO2: 25 mmol/L (ref 22–32)
Calcium: 8.5 mg/dL — ABNORMAL LOW (ref 8.9–10.3)
Chloride: 102 mmol/L (ref 98–111)
Creatinine, Ser: 0.75 mg/dL (ref 0.44–1.00)
GFR, Estimated: >60 mL/min (ref >60)
Glucose, Bld: 102 mg/dL — ABNORMAL HIGH (ref 70–99)
Potassium: 4 mmol/L (ref 3.5–5.1)
Sodium: 133 mmol/L — ABNORMAL LOW (ref 135–145)
Total Bilirubin: 0.7 mg/dL (ref 0.3–1.2)
Total Protein: 6.7 g/dL (ref 6.5–8.1)

## 2021-07-14 MED ORDER — HEPARIN SOD (PORK) LOCK FLUSH 100 UNIT/ML IV SOLN
500.0000 [IU] | Freq: Once | INTRAVENOUS | Status: AC
  Administered 2021-07-14: 500 [IU] via INTRAVENOUS
  Filled 2021-07-14: qty 5

## 2021-07-14 MED ORDER — SODIUM CHLORIDE 0.9% FLUSH
10.0000 mL | Freq: Once | INTRAVENOUS | Status: AC
  Administered 2021-07-14: 10 mL via INTRAVENOUS
  Filled 2021-07-14: qty 10

## 2021-07-14 MED ORDER — SODIUM CHLORIDE 0.9% FLUSH
10.0000 mL | Freq: Once | INTRAVENOUS | Status: DC
  Filled 2021-07-14: qty 10

## 2021-07-14 MED ORDER — HEPARIN SOD (PORK) LOCK FLUSH 100 UNIT/ML IV SOLN
500.0000 [IU] | Freq: Once | INTRAVENOUS | Status: DC
  Filled 2021-07-14: qty 5

## 2021-07-14 MED FILL — Fosaprepitant Dimeglumine For IV Infusion 150 MG (Base Eq): INTRAVENOUS | Qty: 5 | Status: AC

## 2021-07-14 MED FILL — Dexamethasone Sodium Phosphate Inj 100 MG/10ML: INTRAMUSCULAR | Qty: 1 | Status: AC

## 2021-07-15 ENCOUNTER — Other Ambulatory Visit: Payer: Self-pay

## 2021-07-15 ENCOUNTER — Inpatient Hospital Stay: Payer: BC Managed Care – PPO

## 2021-07-15 ENCOUNTER — Ambulatory Visit
Admission: RE | Admit: 2021-07-15 | Discharge: 2021-07-15 | Disposition: A | Discharge status: Home / Self Care | Payer: BC Managed Care – PPO | Source: Ambulatory Visit | Attending: Radiation Oncology | Admitting: Radiation Oncology

## 2021-07-15 VITALS — BP 130/84 | HR 80 | Temp 97.7°F | Resp 18 | Wt 210.5 lb

## 2021-07-15 DIAGNOSIS — Z510 Encounter for antineoplastic radiation therapy: Secondary | ICD-10-CM | POA: Diagnosis not present

## 2021-07-15 DIAGNOSIS — C531 Malignant neoplasm of exocervix: Secondary | ICD-10-CM | POA: Diagnosis not present

## 2021-07-15 DIAGNOSIS — C539 Malignant neoplasm of cervix uteri, unspecified: Secondary | ICD-10-CM

## 2021-07-15 LAB — RAD ONC ARIA SESSION SUMMARY
Course Elapsed Days: 6
Plan Fractions Treated to Date: 2
Plan Prescribed Dose Per Fraction: 2 Gy
Plan Total Fractions Prescribed: 27
Plan Total Prescribed Dose: 54 Gy
Reference Point Dosage Given to Date: 10 Gy
Reference Point Session Dosage Given: 2 Gy
Session Number: 5

## 2021-07-15 MED ORDER — SODIUM CHLORIDE 0.9 % IV SOLN
10.0000 mg | Freq: Once | INTRAVENOUS | Status: AC
  Administered 2021-07-15: 10 mg via INTRAVENOUS
  Filled 2021-07-15: qty 10

## 2021-07-15 MED ORDER — PALONOSETRON HCL INJECTION 0.25 MG/5ML
0.2500 mg | Freq: Once | INTRAVENOUS | Status: AC
  Administered 2021-07-15: 0.25 mg via INTRAVENOUS
  Filled 2021-07-15: qty 5

## 2021-07-15 MED ORDER — POTASSIUM CHLORIDE IN NACL 20-0.9 MEQ/L-% IV SOLN
Freq: Once | INTRAVENOUS | Status: AC
  Filled 2021-07-15: qty 1000

## 2021-07-15 MED ORDER — SODIUM CHLORIDE 0.9 % IV SOLN
40.0000 mg/m2 | Freq: Once | INTRAVENOUS | Status: AC
  Administered 2021-07-15: 84 mg via INTRAVENOUS
  Filled 2021-07-15: qty 84

## 2021-07-15 MED ORDER — SODIUM CHLORIDE 0.9 % IV SOLN
Freq: Once | INTRAVENOUS | Status: AC
  Filled 2021-07-15: qty 250

## 2021-07-15 MED ORDER — SODIUM CHLORIDE 0.9 % IV SOLN
150.0000 mg | Freq: Once | INTRAVENOUS | Status: AC
  Administered 2021-07-15: 150 mg via INTRAVENOUS
  Filled 2021-07-15: qty 150

## 2021-07-15 MED ORDER — MAGNESIUM SULFATE 2 GM/50ML IV SOLN
2.0000 g | Freq: Once | INTRAVENOUS | Status: AC
  Administered 2021-07-15: 2 g via INTRAVENOUS
  Filled 2021-07-15: qty 50

## 2021-07-15 MED ORDER — HEPARIN SOD (PORK) LOCK FLUSH 100 UNIT/ML IV SOLN
INTRAVENOUS | Status: AC
  Administered 2021-07-15: 500 [IU]
  Filled 2021-07-15: qty 5

## 2021-07-15 MED ORDER — HEPARIN SOD (PORK) LOCK FLUSH 100 UNIT/ML IV SOLN
500.0000 [IU] | Freq: Once | INTRAVENOUS | Status: AC | PRN
  Filled 2021-07-15: qty 5

## 2021-07-15 NOTE — Patient Instructions (Signed)
[facility] CANCER CTR AT [place]  Discharge Instructions: Thank you for choosing [place] to provide your oncology and hematology care.  If you have a lab appointment with the Center, please go directly to the [place] and check in at the registration area.  Wear comfortable clothing and clothing appropriate for easy access to any Portacath or PICC line.   We strive to give you quality time with your provider. You may need to reschedule your appointment if you arrive late (15 or more minutes).  Arriving late affects you and other patients whose appointments are after yours.  Also, if you miss three or more appointments without notifying the office, you may be dismissed from the clinic at the provider's discretion.      For prescription refill requests, have your pharmacy contact our office and allow 72 hours for refills to be completed.    Today you received the following chemotherapy and/or immunotherapy agents Cisplatin    To help prevent nausea and vomiting after your treatment, we encourage you to take your nausea medication as directed.  BELOW ARE SYMPTOMS THAT SHOULD BE REPORTED IMMEDIATELY: *FEVER GREATER THAN 100.4 F (38 C) OR HIGHER *CHILLS OR SWEATING *NAUSEA AND VOMITING THAT IS NOT CONTROLLED WITH YOUR NAUSEA MEDICATION *UNUSUAL SHORTNESS OF BREATH *UNUSUAL BRUISING OR BLEEDING *URINARY PROBLEMS (pain or burning when urinating, or frequent urination) *BOWEL PROBLEMS (unusual diarrhea, constipation, pain near the anus) TENDERNESS IN MOUTH AND THROAT WITH OR WITHOUT PRESENCE OF ULCERS (sore throat, sores in mouth, or a toothache) UNUSUAL RASH, SWELLING OR PAIN  UNUSUAL VAGINAL DISCHARGE OR ITCHING   Items with * indicate a potential emergency and should be followed up as soon as possible or go to the Emergency Department if any problems should occur.  Please show the CHEMOTHERAPY ALERT CARD or IMMUNOTHERAPY ALERT CARD at check-in to the  Emergency Department and triage nurse.  Should you have questions after your visit or need to cancel or reschedule your appointment, please contact [facility] CANCER [place] AT [place]  [phone] and follow the prompts.  Office hours are 8:00 a.m. to 4:30 p.m. Monday - Friday. Please note that voicemails left after 4:00 p.m. may not be returned until the following business day.  We are closed weekends and major holidays. You have access to a nurse at all times for urgent questions. Please call the main number to the clinic [phone] and follow the prompts.  For any non-urgent questions, you may also contact your provider using MyChart. We now offer e-Visits for anyone 30 and older to request care online for non-urgent symptoms. For details visit [email or web address].   Also download the MyChart app! Go to the app store, search "MyChart", open the app, select [place], and log in with your MyChart username and password.  Due to Covid, a mask is required upon entering the hospital/clinic. If you do not have a mask, one will be given to you upon arrival. For doctor visits, patients may have 1 support person aged 30 or older with them. For treatment visits, patients cannot have anyone with them due to current Covid guidelines and our immunocompromised population.

## 2021-07-16 ENCOUNTER — Ambulatory Visit
Admission: RE | Admit: 2021-07-16 | Discharge: 2021-07-16 | Disposition: A | Discharge status: Home / Self Care | Payer: BC Managed Care – PPO | Source: Ambulatory Visit | Attending: Radiation Oncology | Admitting: Radiation Oncology

## 2021-07-16 ENCOUNTER — Other Ambulatory Visit: Payer: Self-pay

## 2021-07-16 DIAGNOSIS — C531 Malignant neoplasm of exocervix: Secondary | ICD-10-CM | POA: Diagnosis not present

## 2021-07-16 DIAGNOSIS — Z510 Encounter for antineoplastic radiation therapy: Secondary | ICD-10-CM | POA: Diagnosis not present

## 2021-07-16 LAB — RAD ONC ARIA SESSION SUMMARY
Course Elapsed Days: 7
Plan Fractions Treated to Date: 3
Plan Prescribed Dose Per Fraction: 2 Gy
Plan Total Fractions Prescribed: 27
Plan Total Prescribed Dose: 54 Gy
Reference Point Dosage Given to Date: 12 Gy
Reference Point Session Dosage Given: 2 Gy
Session Number: 6

## 2021-07-17 ENCOUNTER — Other Ambulatory Visit: Payer: Self-pay

## 2021-07-17 ENCOUNTER — Ambulatory Visit
Admission: RE | Admit: 2021-07-17 | Discharge: 2021-07-17 | Disposition: A | Discharge status: Home / Self Care | Payer: BC Managed Care – PPO | Source: Ambulatory Visit | Attending: Radiation Oncology | Admitting: Radiation Oncology

## 2021-07-17 DIAGNOSIS — Z510 Encounter for antineoplastic radiation therapy: Secondary | ICD-10-CM | POA: Diagnosis not present

## 2021-07-17 DIAGNOSIS — C531 Malignant neoplasm of exocervix: Secondary | ICD-10-CM | POA: Diagnosis not present

## 2021-07-17 LAB — RAD ONC ARIA SESSION SUMMARY
Course Elapsed Days: 8
Plan Fractions Treated to Date: 4
Plan Prescribed Dose Per Fraction: 2 Gy
Plan Total Fractions Prescribed: 27
Plan Total Prescribed Dose: 54 Gy
Reference Point Dosage Given to Date: 14 Gy
Reference Point Session Dosage Given: 2 Gy
Session Number: 7

## 2021-07-18 ENCOUNTER — Other Ambulatory Visit: Payer: Self-pay

## 2021-07-18 ENCOUNTER — Ambulatory Visit
Admission: RE | Admit: 2021-07-18 | Discharge: 2021-07-18 | Disposition: A | Discharge status: Home / Self Care | Payer: BC Managed Care – PPO | Source: Ambulatory Visit | Attending: Radiation Oncology | Admitting: Radiation Oncology

## 2021-07-18 DIAGNOSIS — Z510 Encounter for antineoplastic radiation therapy: Secondary | ICD-10-CM | POA: Diagnosis not present

## 2021-07-18 DIAGNOSIS — C531 Malignant neoplasm of exocervix: Secondary | ICD-10-CM | POA: Diagnosis not present

## 2021-07-18 LAB — RAD ONC ARIA SESSION SUMMARY
Course Elapsed Days: 9
Plan Fractions Treated to Date: 5
Plan Prescribed Dose Per Fraction: 2 Gy
Plan Total Fractions Prescribed: 27
Plan Total Prescribed Dose: 54 Gy
Reference Point Dosage Given to Date: 16 Gy
Reference Point Session Dosage Given: 2 Gy
Session Number: 8

## 2021-07-22 ENCOUNTER — Other Ambulatory Visit: Payer: Self-pay

## 2021-07-22 ENCOUNTER — Encounter: Payer: Self-pay | Admitting: Oncology

## 2021-07-22 ENCOUNTER — Inpatient Hospital Stay: Payer: BC Managed Care – PPO

## 2021-07-22 ENCOUNTER — Ambulatory Visit
Admission: RE | Admit: 2021-07-22 | Discharge: 2021-07-22 | Disposition: A | Discharge status: Home / Self Care | Payer: BC Managed Care – PPO | Source: Ambulatory Visit | Attending: Radiation Oncology | Admitting: Radiation Oncology

## 2021-07-22 ENCOUNTER — Inpatient Hospital Stay (HOSPITAL_BASED_OUTPATIENT_CLINIC_OR_DEPARTMENT_OTHER): Payer: BC Managed Care – PPO | Admitting: Oncology

## 2021-07-22 VITALS — BP 118/81 | HR 67 | Temp 98.5°F | Resp 18 | Wt 213.4 lb

## 2021-07-22 DIAGNOSIS — Z5111 Encounter for antineoplastic chemotherapy: Secondary | ICD-10-CM

## 2021-07-22 DIAGNOSIS — Z510 Encounter for antineoplastic radiation therapy: Secondary | ICD-10-CM | POA: Diagnosis not present

## 2021-07-22 DIAGNOSIS — R7989 Other specified abnormal findings of blood chemistry: Secondary | ICD-10-CM

## 2021-07-22 DIAGNOSIS — C531 Malignant neoplasm of exocervix: Secondary | ICD-10-CM | POA: Diagnosis not present

## 2021-07-22 DIAGNOSIS — C539 Malignant neoplasm of cervix uteri, unspecified: Secondary | ICD-10-CM

## 2021-07-22 LAB — CBC WITH DIFFERENTIAL/PLATELET
Abs Immature Granulocytes: 0.03 10*3/uL (ref 0.00–0.07)
Basophils Absolute: 0 10*3/uL (ref 0.0–0.1)
Basophils Relative: 1 %
Eosinophils Absolute: 0.2 10*3/uL (ref 0.0–0.5)
Eosinophils Relative: 4 %
HCT: 35.8 % — ABNORMAL LOW (ref 36.0–46.0)
Hemoglobin: 12.5 g/dL (ref 12.0–15.0)
Immature Granulocytes: 1 %
Lymphocytes Relative: 19 %
Lymphs Abs: 0.8 10*3/uL (ref 0.7–4.0)
MCH: 31.4 pg (ref 26.0–34.0)
MCHC: 34.9 g/dL (ref 30.0–36.0)
MCV: 89.9 fL (ref 80.0–100.0)
Monocytes Absolute: 0.4 10*3/uL (ref 0.1–1.0)
Monocytes Relative: 9 %
Neutro Abs: 2.9 10*3/uL (ref 1.7–7.7)
Neutrophils Relative %: 66 %
Platelets: 157 10*3/uL (ref 150–400)
RBC: 3.98 MIL/uL (ref 3.87–5.11)
RDW: 11.7 % (ref 11.5–15.5)
WBC: 4.3 10*3/uL (ref 4.0–10.5)
nRBC: 0 % (ref 0.0–0.2)

## 2021-07-22 LAB — COMPREHENSIVE METABOLIC PANEL
ALT: 84 U/L — ABNORMAL HIGH (ref 0–44)
AST: 51 U/L — ABNORMAL HIGH (ref 15–41)
Albumin: 3.3 g/dL — ABNORMAL LOW (ref 3.5–5.0)
Alkaline Phosphatase: 83 U/L (ref 38–126)
Anion gap: 6 (ref 5–15)
BUN: 11 mg/dL (ref 6–20)
CO2: 24 mmol/L (ref 22–32)
Calcium: 8.3 mg/dL — ABNORMAL LOW (ref 8.9–10.3)
Chloride: 103 mmol/L (ref 98–111)
Creatinine, Ser: 0.65 mg/dL (ref 0.44–1.00)
GFR, Estimated: >60 mL/min (ref >60)
Glucose, Bld: 108 mg/dL — ABNORMAL HIGH (ref 70–99)
Potassium: 4 mmol/L (ref 3.5–5.1)
Sodium: 133 mmol/L — ABNORMAL LOW (ref 135–145)
Total Bilirubin: 0.3 mg/dL (ref 0.3–1.2)
Total Protein: 6.2 g/dL — ABNORMAL LOW (ref 6.5–8.1)

## 2021-07-22 LAB — RAD ONC ARIA SESSION SUMMARY
Course Elapsed Days: 13
Plan Fractions Treated to Date: 6
Plan Prescribed Dose Per Fraction: 2 Gy
Plan Total Fractions Prescribed: 27
Plan Total Prescribed Dose: 54 Gy
Reference Point Dosage Given to Date: 18 Gy
Reference Point Session Dosage Given: 2 Gy
Session Number: 9

## 2021-07-22 LAB — MAGNESIUM: Magnesium: 2 mg/dL (ref 1.7–2.4)

## 2021-07-22 MED ORDER — HEPARIN SOD (PORK) LOCK FLUSH 100 UNIT/ML IV SOLN
500.0000 [IU] | Freq: Once | INTRAVENOUS | Status: AC
  Administered 2021-07-22: 500 [IU] via INTRAVENOUS
  Filled 2021-07-22: qty 5

## 2021-07-22 MED ORDER — SODIUM CHLORIDE 0.9% FLUSH
10.0000 mL | Freq: Once | INTRAVENOUS | Status: AC
  Administered 2021-07-22: 10 mL via INTRAVENOUS
  Filled 2021-07-22: qty 10

## 2021-07-22 NOTE — Progress Notes (Signed)
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  She was seen by GYN and had a cervical exam which showed a large pink friable vascularized irregular growth in the cervix extending from 11:00 to 5 o'clock position.  Pap showed ASCUS and high risk HPV.  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  She is married and uses NuvaRing for contraception.  She does not have any children yet.  She has had 2 Gardasil vaccinations in the past but did not receive a booster patient was seen by GYN oncology Dr. Fransisca Connors and plan was to obtain a PET CT scan to decide if she would be a candidate for surgery.  PET scan showed hypermetabolic activity of the cervix compatible with primary malignancy.  Hypermetabolic subcentimeter bilateral external iliac lymph nodes concerning for metastatic disease.  Hypermetabolic left inguinal lymph node concerning for additional site of metastatic disease.  Bilateral nonobstructing renal stones.  Plan is to proceed with definitive concurrent chemoradiation with weekly cisplatin which she stopped on 07/07/2021  Interval history-patient is tolerating chemoradiation well so far.  Denies any significant nausea or vomiting.  She does report diffuse muscle stiffness/aches.  Has some sense of urinary urgency but denies other  complaints at this time  ECOG PS- 0 Pain scale- 0   Review of systems- Review of Systems  Constitutional:  Positive for malaise/fatigue. Negative for chills, fever and weight loss.  HENT:  Negative for congestion, ear discharge and nosebleeds.   Eyes:  Negative for blurred vision.  Respiratory:  Negative for cough, hemoptysis, sputum production, shortness of breath and wheezing.   Cardiovascular:  Negative for chest pain, palpitations, orthopnea and claudication.  Gastrointestinal:  Negative for abdominal pain, blood in stool, constipation, diarrhea, heartburn, melena, nausea and vomiting.  Genitourinary:  Negative for dysuria, flank pain, frequency, hematuria and urgency.  Musculoskeletal:  Negative for back pain, joint pain and myalgias.  Skin:  Negative for rash.  Neurological:  Negative for dizziness, tingling, focal weakness, seizures, weakness and headaches.  Endo/Heme/Allergies:  Does not bruise/bleed easily.  Psychiatric/Behavioral:  Negative for depression and suicidal ideas. The patient does not have insomnia.       No Known Allergies   Past Medical History:  Diagnosis Date   Allergic rhinitis    Allergy    Cervical cancer (HCC)      Past Surgical History:  Procedure Laterality Date   IR IMAGING GUIDED PORT INSERTION  06/30/2021   TONSILECTOMY, ADENOIDECTOMY, BILATERAL MYRINGOTOMY AND TUBES Bilateral    TONSILLECTOMY     WISDOM TOOTH EXTRACTION      Social History   Socioeconomic History   Marital status: Married    Spouse name: Not on file   Number of children: Not on file   Years of education: Not on file   Highest education level: Not on file  Occupational History   Not on file  Tobacco Use  Smoking status: Never   Smokeless tobacco: Never  Vaping Use   Vaping Use: Never used  Substance and Sexual Activity   Alcohol use: Yes    Alcohol/week: 1.0 standard drink    Types: 1 Glasses of wine per week    Comment: occasional--every couple of weeks.    Drug use: No   Sexual activity: Yes  Other Topics Concern   Not on file  Social History Narrative   Not on file   Social Determinants of Health   Financial Resource Strain: Not on file  Food Insecurity: Not on file  Transportation Needs: Not on file  Physical Activity: Not on file  Stress: Not on file  Social Connections: Not on file  Intimate Partner Violence: Not on file    Family History  Problem Relation Age of Onset   Heart disease Father    Breast cancer Maternal Grandmother    Lung cancer Paternal Grandmother    Heart attack Paternal Grandfather      Current Outpatient Medications:    acetaminophen (TYLENOL) 500 MG tablet, Take 500 mg by mouth every 6 (six) hours as needed., Disp: , Rfl:    lidocaine-prilocaine (EMLA) cream, Apply to affected area once, Disp: 30 g, Rfl: 3   Simethicone (GAS-X MAXIMUM STRENGTH PO), Take by mouth., Disp: , Rfl:    aspirin-acetaminophen-caffeine (EXCEDRIN MIGRAINE) 419-379-02 MG tablet, Take by mouth every 6 (six) hours as needed for headache. (Patient not taking: Reported on 07/22/2021), Disp: , Rfl:    dexamethasone (DECADRON) 4 MG tablet, Take 2 tablets (8 mg total) by mouth daily. Take daily x 3 days starting the day after cisplatin chemotherapy. Take with food. (Patient not taking: Reported on 07/07/2021), Disp: 30 tablet, Rfl: 1   escitalopram (LEXAPRO) 10 MG tablet, Take 10 mg by mouth daily. (Patient not taking: Reported on 06/24/2021), Disp: , Rfl:    etonogestrel-ethinyl estradiol (NUVARING) 0.12-0.015 MG/24HR vaginal ring, Place vaginally. (Patient not taking: Reported on 07/07/2021), Disp: , Rfl:    LORazepam (ATIVAN) 0.5 MG tablet, Take 1 tablet (0.5 mg total) by mouth every 6 (six) hours as needed (Nausea or vomiting). (Patient not taking: Reported on 07/07/2021), Disp: 30 tablet, Rfl: 0   ondansetron (ZOFRAN) 8 MG tablet, Take 1 tablet (8 mg total) by mouth 2 (two) times daily as needed. Start on the third day after cisplatin  chemotherapy. (Patient not taking: Reported on 07/07/2021), Disp: 30 tablet, Rfl: 1   prochlorperazine (COMPAZINE) 10 MG tablet, Take 1 tablet (10 mg total) by mouth every 6 (six) hours as needed (Nausea or vomiting). (Patient not taking: Reported on 07/07/2021), Disp: 30 tablet, Rfl: 1 No current facility-administered medications for this visit.  Facility-Administered Medications Ordered in Other Visits:    sodium chloride flush (NS) 0.9 % injection 10 mL, 10 mL, Intravenous, PRN, Sindy Guadeloupe, MD, 10 mL at 07/07/21 0841  Physical exam:  Vitals:   07/22/21 0953  BP: 118/81  Pulse: 67  Resp: 18  Temp: 98.5 F (36.9 C)  SpO2: 98%  Weight: 213 lb 6.4 oz (96.8 kg)   Physical Exam Constitutional:      General: She is not in acute distress. Cardiovascular:     Rate and Rhythm: Normal rate and regular rhythm.     Heart sounds: Normal heart sounds.  Pulmonary:     Effort: Pulmonary effort is normal.     Breath sounds: Normal breath sounds.  Abdominal:     General: Bowel sounds are normal.  Palpations: Abdomen is soft.  Skin:    General: Skin is warm and dry.  Neurological:     Mental Status: She is alert and oriented to person, place, and time.        Latest Ref Rng & Units 07/22/2021    9:32 AM  CMP  Glucose 70 - 99 mg/dL 108    BUN 6 - 20 mg/dL 11    Creatinine 0.44 - 1.00 mg/dL 0.65    Sodium 135 - 145 mmol/L 133    Potassium 3.5 - 5.1 mmol/L 4.0    Chloride 98 - 111 mmol/L 103    CO2 22 - 32 mmol/L 24    Calcium 8.9 - 10.3 mg/dL 8.3    Total Protein 6.5 - 8.1 g/dL 6.2    Total Bilirubin 0.3 - 1.2 mg/dL 0.3    Alkaline Phos 38 - 126 U/L 83    AST 15 - 41 U/L 51    ALT 0 - 44 U/L 84        Latest Ref Rng & Units 07/22/2021    9:32 AM  CBC  WBC 4.0 - 10.5 K/uL 4.3    Hemoglobin 12.0 - 15.0 g/dL 12.5    Hematocrit 36.0 - 46.0 % 35.8    Platelets 150 - 400 K/uL 157      No images are attached to the encounter.  IR IMAGING GUIDED PORT INSERTION  Result  Date: 06/30/2021 INDICATION: CERVICAL CANCER EXAM: Ultrasound-guided puncture of the right internal jugular vein Placement of a right-sided chest port using fluoroscopic guidance MEDICATIONS: Per EMR ANESTHESIA/SEDATION: Moderate (conscious) sedation was employed during this procedure. A total of Versed 2 mg and Fentanyl 100 mcg was administered intravenously. Moderate Sedation Time: 27 minutes. The patient's level of consciousness and vital signs were monitored continuously by radiology nursing throughout the procedure under my direct supervision. FLUOROSCOPY TIME:  Fluoroscopy Time: 0.6 minutes (4 mGy) COMPLICATIONS: None immediate. PROCEDURE: Informed written consent was obtained from the patient after a thorough discussion of the procedural risks, benefits and alternatives. All questions were addressed. Maximal Sterile Barrier Technique was utilized including caps, mask, sterile gowns, sterile gloves, sterile drape, hand hygiene and skin antiseptic. A timeout was performed prior to the initiation of the procedure. The patient was placed supine on the exam table. The right neck and chest was prepped and draped in the standard sterile fashion. A preliminary ultrasound of the right neck was performed and demonstrates a patent right internal jugular vein. A permanent ultrasound image was stored in the electronic medical record. The overlying skin was anesthetized with 1% Lidocaine. Using ultrasound guidance, access was obtained into the right internal jugular vein using a 21 gauge micropuncture set. A wire was advanced into the SVC, a short incision was made at the puncture site, and serial dilatation performed. Next, in an ipsilateral infraclavicular location, an incision was made at the site of the subcutaneous reservoir. Blunt dissection was used to open a pocket to contain the reservoir. A subcutaneous tunnel was then created from the port site to the puncture site. A(n) 8 Fr single lumen catheter was advanced  through the tunnel. The catheter was attached to the port and this was placed in the subcutaneous pocket. Under fluoroscopic guidance, a peel away sheath was placed, and the catheter was trimmed to the appropriate length and was advanced into the central veins. The catheter length is 24 cm. The tip of the catheter lies near the superior cavoatrial junction. The port flushes and  aspirates appropriately. The port was flushed and locked with heparinized saline. The port pocket was closed in 2 layers using 3-0 and 4-0 Vicryl/absorbable suture. Dermabond was also applied to both incisions. The patient tolerated the procedure well and was transferred to recovery in stable condition. IMPRESSION: Successful placement of a right-sided chest port via the right internal jugular vein. The port is ready for immediate use. Electronically Signed   By: Albin Felling M.D.   On: 06/30/2021 14:48     Assessment and plan- Patient is a 31 y.o. female stage IIIc HPV positive cervical cancer here for on treatment assessment prior to cycle 3 of weekly cisplatin chemotherapy  Counts okay to proceed with cycle 3 of weekly cisplatin chemotherapy today.  She will directly proceed for cycle 4 next week and I will see her back in 2 weeks for cycle 5.  So far tolerating chemotherapy well.  Patient has mildly abnormal LFTs in the presence of normal bilirubin.  Possibly secondary to chemotherapy.  Continue to monitor.  If LFTs continue to trend up I will consider getting a right upper quadrant ultrasound  White cell count has gone down from 8-4.3 today.  ANC is still up at 2.9.  If her [place] is 1.5 or lower end of next cycle she will require growth factor support for 3 days   Visit Diagnosis 1. Encounter for antineoplastic chemotherapy   2. Cervical cancer, FIGO stage III ([place])   3. Abnormal LFTs      Dr. Randa Evens, MD, MPH [facility] at [facility] [phone] 07/22/2021 4:58 PM

## 2021-07-22 NOTE — Progress Notes (Signed)
trouble sleeping and muscle tension; has started taking melatonin to help sleep. as well as gas-x. ringing in her ears; comes and goes--high pitched ring that lasted about one minute and will stop. occured about 3x yesterday. General fatigue and tired.

## 2021-07-23 ENCOUNTER — Other Ambulatory Visit: Payer: Self-pay

## 2021-07-23 ENCOUNTER — Ambulatory Visit
Admission: RE | Admit: 2021-07-23 | Discharge: 2021-07-23 | Disposition: A | Discharge status: Home / Self Care | Payer: BC Managed Care – PPO | Source: Ambulatory Visit | Attending: Radiation Oncology | Admitting: Radiation Oncology

## 2021-07-23 ENCOUNTER — Inpatient Hospital Stay: Payer: BC Managed Care – PPO

## 2021-07-23 VITALS — BP 140/81 | HR 75 | Temp 97.3°F | Resp 16 | Wt 213.4 lb

## 2021-07-23 DIAGNOSIS — Z510 Encounter for antineoplastic radiation therapy: Secondary | ICD-10-CM | POA: Diagnosis not present

## 2021-07-23 DIAGNOSIS — C531 Malignant neoplasm of exocervix: Secondary | ICD-10-CM | POA: Diagnosis not present

## 2021-07-23 DIAGNOSIS — C539 Malignant neoplasm of cervix uteri, unspecified: Secondary | ICD-10-CM

## 2021-07-23 LAB — RAD ONC ARIA SESSION SUMMARY
Course Elapsed Days: 14
Plan Fractions Treated to Date: 7
Plan Prescribed Dose Per Fraction: 2 Gy
Plan Total Fractions Prescribed: 27
Plan Total Prescribed Dose: 54 Gy
Reference Point Dosage Given to Date: 20 Gy
Reference Point Session Dosage Given: 2 Gy
Session Number: 10

## 2021-07-23 MED ORDER — HEPARIN SOD (PORK) LOCK FLUSH 100 UNIT/ML IV SOLN
500.0000 [IU] | Freq: Once | INTRAVENOUS | Status: AC | PRN
  Administered 2021-07-23: 500 [IU]
  Filled 2021-07-23: qty 5

## 2021-07-23 MED ORDER — SODIUM CHLORIDE 0.9 % IV SOLN
150.0000 mg | Freq: Once | INTRAVENOUS | Status: AC
  Administered 2021-07-23: 150 mg via INTRAVENOUS
  Filled 2021-07-23: qty 150

## 2021-07-23 MED ORDER — MAGNESIUM SULFATE 2 GM/50ML IV SOLN
2.0000 g | Freq: Once | INTRAVENOUS | Status: AC
  Administered 2021-07-23: 2 g via INTRAVENOUS
  Filled 2021-07-23: qty 50

## 2021-07-23 MED ORDER — PALONOSETRON HCL INJECTION 0.25 MG/5ML
0.2500 mg | Freq: Once | INTRAVENOUS | Status: AC
  Administered 2021-07-23: 0.25 mg via INTRAVENOUS
  Filled 2021-07-23: qty 5

## 2021-07-23 MED ORDER — SODIUM CHLORIDE 0.9 % IV SOLN
Freq: Once | INTRAVENOUS | Status: AC
  Filled 2021-07-23: qty 250

## 2021-07-23 MED ORDER — POTASSIUM CHLORIDE IN NACL 20-0.9 MEQ/L-% IV SOLN
Freq: Once | INTRAVENOUS | Status: AC
  Filled 2021-07-23: qty 1000

## 2021-07-23 MED ORDER — SODIUM CHLORIDE 0.9% FLUSH
10.0000 mL | INTRAVENOUS | Status: DC | PRN
  Administered 2021-07-23: 10 mL
  Filled 2021-07-23: qty 10

## 2021-07-23 MED ORDER — SODIUM CHLORIDE 0.9 % IV SOLN
10.0000 mg | Freq: Once | INTRAVENOUS | Status: AC
  Administered 2021-07-23: 10 mg via INTRAVENOUS
  Filled 2021-07-23: qty 10

## 2021-07-23 MED ORDER — SODIUM CHLORIDE 0.9 % IV SOLN
40.0000 mg/m2 | Freq: Once | INTRAVENOUS | Status: AC
  Administered 2021-07-23: 84 mg via INTRAVENOUS
  Filled 2021-07-23: qty 84

## 2021-07-23 NOTE — Patient Instructions (Signed)
[facility] CANCER CTR AT [place]-[facility]ICAL [facility]COLOGY  Discharge Instructions: ?Thank you for choosing [facility] to provide your oncology and hematology care.  ?If you have a lab appointment with the Cancer Center, please go directly to the Cancer Center and check in at the registration area. ? ?Wear comfortable clothing and clothing appropriate for easy access to any Portacath or PICC line.  ? ?We strive to give you quality time with your provider. You may need to reschedule your appointment if you arrive late (15 or more minutes).  Arriving late affects you and other patients whose appointments are after yours.  Also, if you miss three or more appointments without notifying the office, you may be dismissed from the clinic at the provider?s discretion.    ?  ?For prescription refill requests, have your pharmacy contact our office and allow 72 hours for refills to be completed.   ? ?  ?To help prevent nausea and vomiting after your treatment, we encourage you to take your nausea medication as directed. ? ?BELOW ARE SYMPTOMS THAT SHOULD BE REPORTED IMMEDIATELY: ?*FEVER GREATER THAN 100.4 F (38 ?C) OR HIGHER ?*CHILLS OR SWEATING ?*NAUSEA AND VOMITING THAT IS NOT CONTROLLED WITH YOUR NAUSEA MEDICATION ?*UNUSUAL SHORTNESS OF BREATH ?*UNUSUAL BRUISING OR BLEEDING ?*URINARY PROBLEMS (pain or burning when urinating, or frequent urination) ?*BOWEL PROBLEMS (unusual diarrhea, constipation, pain near the anus) ?TENDERNESS IN MOUTH AND THROAT WITH OR WITHOUT PRESENCE OF ULCERS (sore throat, sores in mouth, or a toothache) ?UNUSUAL RASH, SWELLING OR PAIN  ?UNUSUAL VAGINAL DISCHARGE OR ITCHING  ? ?Items with * indicate a potential emergency and should be followed up as soon as possible or go to the Emergency Department if any problems should occur. ? ?Please show the CHEMOTHERAPY ALERT CARD or IMMUNOTHERAPY ALERT CARD at check-in to the Emergency Department and triage nurse. ? ?Should you have questions after your visit  or need to cancel or reschedule your appointment, please contact [facility] CANCER CTR AT [place]COL[facility]  [phone] and follow the prompts.  Office hours are 8:00 a.m. to 4:30 p.m. Monday - Friday. Please note that voicemails left after 4:00 p.m. may not be returned until the following business day.  We are closed weekends and major holidays. You have access to a nurse at all times for urgent questions. Please call the main number to the clinic [phone] and follow the prompts. ? ?For any non-urgent questions, you may also contact your provider using MyChart. We now offer e-Visits for anyone 18 and older to request care online for non-urgent symptoms. For details visit [email or web address]. ?  ?Also download the MyChart app! Go to the app store, search "MyChart", open the app, select [place], and log in with your MyChart username and password. ? ?Due to Covid, a mask is required upon entering the hospital/clinic. If you do not have a mask, one will be given to you upon arrival. For doctor visits, patients may have 1 support person aged 18 or older with them. For treatment visits, patients cannot have anyone with them due to current Covid guidelines and our immunocompromised population.  ?

## 2021-07-24 ENCOUNTER — Other Ambulatory Visit: Payer: Self-pay

## 2021-07-24 ENCOUNTER — Ambulatory Visit
Admission: RE | Admit: 2021-07-24 | Discharge: 2021-07-24 | Disposition: A | Discharge status: Home / Self Care | Payer: BC Managed Care – PPO | Source: Ambulatory Visit | Attending: Radiation Oncology | Admitting: Radiation Oncology

## 2021-07-24 DIAGNOSIS — C531 Malignant neoplasm of exocervix: Secondary | ICD-10-CM | POA: Insufficient documentation

## 2021-07-24 DIAGNOSIS — Z510 Encounter for antineoplastic radiation therapy: Secondary | ICD-10-CM | POA: Diagnosis not present

## 2021-07-24 LAB — RAD ONC ARIA SESSION SUMMARY
Course Elapsed Days: 15
Plan Fractions Treated to Date: 8
Plan Prescribed Dose Per Fraction: 2 Gy
Plan Total Fractions Prescribed: 27
Plan Total Prescribed Dose: 54 Gy
Reference Point Dosage Given to Date: 22 Gy
Reference Point Session Dosage Given: 2 Gy
Session Number: 11

## 2021-07-25 ENCOUNTER — Other Ambulatory Visit: Payer: Self-pay

## 2021-07-25 ENCOUNTER — Ambulatory Visit
Admission: RE | Admit: 2021-07-25 | Discharge: 2021-07-25 | Disposition: A | Discharge status: Home / Self Care | Payer: BC Managed Care – PPO | Source: Ambulatory Visit | Attending: Radiation Oncology | Admitting: Radiation Oncology

## 2021-07-25 DIAGNOSIS — C531 Malignant neoplasm of exocervix: Secondary | ICD-10-CM | POA: Diagnosis not present

## 2021-07-25 DIAGNOSIS — Z510 Encounter for antineoplastic radiation therapy: Secondary | ICD-10-CM | POA: Diagnosis not present

## 2021-07-25 LAB — RAD ONC ARIA SESSION SUMMARY
Course Elapsed Days: 16
Plan Fractions Treated to Date: 9
Plan Prescribed Dose Per Fraction: 2 Gy
Plan Total Fractions Prescribed: 27
Plan Total Prescribed Dose: 54 Gy
Reference Point Dosage Given to Date: 24 Gy
Reference Point Session Dosage Given: 2 Gy
Session Number: 12

## 2021-07-28 ENCOUNTER — Other Ambulatory Visit: Payer: Self-pay

## 2021-07-28 ENCOUNTER — Ambulatory Visit
Admission: RE | Admit: 2021-07-28 | Discharge: 2021-07-28 | Disposition: A | Discharge status: Home / Self Care | Payer: BC Managed Care – PPO | Source: Ambulatory Visit | Attending: Radiation Oncology | Admitting: Radiation Oncology

## 2021-07-28 DIAGNOSIS — Z510 Encounter for antineoplastic radiation therapy: Secondary | ICD-10-CM | POA: Diagnosis not present

## 2021-07-28 DIAGNOSIS — C531 Malignant neoplasm of exocervix: Secondary | ICD-10-CM | POA: Diagnosis not present

## 2021-07-28 LAB — RAD ONC ARIA SESSION SUMMARY
Course Elapsed Days: 19
Plan Fractions Treated to Date: 10
Plan Prescribed Dose Per Fraction: 2 Gy
Plan Total Fractions Prescribed: 27
Plan Total Prescribed Dose: 54 Gy
Reference Point Dosage Given to Date: 26 Gy
Reference Point Session Dosage Given: 2 Gy
Session Number: 13

## 2021-07-28 MED FILL — Dexamethasone Sodium Phosphate Inj 100 MG/10ML: INTRAMUSCULAR | Qty: 1 | Status: AC

## 2021-07-28 MED FILL — Fosaprepitant Dimeglumine For IV Infusion 150 MG (Base Eq): INTRAVENOUS | Qty: 5 | Status: AC

## 2021-07-29 ENCOUNTER — Ambulatory Visit
Admission: RE | Admit: 2021-07-29 | Discharge: 2021-07-29 | Disposition: A | Discharge status: Home / Self Care | Payer: BC Managed Care – PPO | Source: Ambulatory Visit | Attending: Radiation Oncology | Admitting: Radiation Oncology

## 2021-07-29 ENCOUNTER — Inpatient Hospital Stay: Payer: BC Managed Care – PPO

## 2021-07-29 ENCOUNTER — Other Ambulatory Visit: Payer: Self-pay

## 2021-07-29 VITALS — BP 138/89 | HR 76 | Temp 97.5°F | Resp 16 | Wt 216.1 lb

## 2021-07-29 DIAGNOSIS — T451X5A Adverse effect of antineoplastic and immunosuppressive drugs, initial encounter: Secondary | ICD-10-CM | POA: Insufficient documentation

## 2021-07-29 DIAGNOSIS — C539 Malignant neoplasm of cervix uteri, unspecified: Secondary | ICD-10-CM

## 2021-07-29 DIAGNOSIS — Z510 Encounter for antineoplastic radiation therapy: Secondary | ICD-10-CM | POA: Diagnosis not present

## 2021-07-29 DIAGNOSIS — D702 Other drug-induced agranulocytosis: Secondary | ICD-10-CM | POA: Insufficient documentation

## 2021-07-29 DIAGNOSIS — C531 Malignant neoplasm of exocervix: Secondary | ICD-10-CM | POA: Diagnosis not present

## 2021-07-29 DIAGNOSIS — R87810 Cervical high risk human papillomavirus (HPV) DNA test positive: Secondary | ICD-10-CM | POA: Insufficient documentation

## 2021-07-29 DIAGNOSIS — R87820 Cervical low risk human papillomavirus (HPV) DNA test positive: Secondary | ICD-10-CM | POA: Insufficient documentation

## 2021-07-29 DIAGNOSIS — Z801 Family history of malignant neoplasm of trachea, bronchus and lung: Secondary | ICD-10-CM | POA: Insufficient documentation

## 2021-07-29 DIAGNOSIS — Z5111 Encounter for antineoplastic chemotherapy: Secondary | ICD-10-CM | POA: Insufficient documentation

## 2021-07-29 DIAGNOSIS — N200 Calculus of kidney: Secondary | ICD-10-CM | POA: Insufficient documentation

## 2021-07-29 DIAGNOSIS — Z793 Long term (current) use of hormonal contraceptives: Secondary | ICD-10-CM | POA: Insufficient documentation

## 2021-07-29 DIAGNOSIS — Z803 Family history of malignant neoplasm of breast: Secondary | ICD-10-CM | POA: Insufficient documentation

## 2021-07-29 DIAGNOSIS — Z79899 Other long term (current) drug therapy: Secondary | ICD-10-CM | POA: Insufficient documentation

## 2021-07-29 DIAGNOSIS — Z7952 Long term (current) use of systemic steroids: Secondary | ICD-10-CM | POA: Insufficient documentation

## 2021-07-29 DIAGNOSIS — Z8249 Family history of ischemic heart disease and other diseases of the circulatory system: Secondary | ICD-10-CM | POA: Insufficient documentation

## 2021-07-29 DIAGNOSIS — D6959 Other secondary thrombocytopenia: Secondary | ICD-10-CM | POA: Insufficient documentation

## 2021-07-29 LAB — CBC WITH DIFFERENTIAL/PLATELET
Abs Immature Granulocytes: 0.02 10*3/uL (ref 0.00–0.07)
Basophils Absolute: 0 10*3/uL (ref 0.0–0.1)
Basophils Relative: 1 %
Eosinophils Absolute: 0.1 10*3/uL (ref 0.0–0.5)
Eosinophils Relative: 3 %
HCT: 34.2 % — ABNORMAL LOW (ref 36.0–46.0)
Hemoglobin: 12 g/dL (ref 12.0–15.0)
Immature Granulocytes: 1 %
Lymphocytes Relative: 19 %
Lymphs Abs: 0.5 10*3/uL — ABNORMAL LOW (ref 0.7–4.0)
MCH: 31.5 pg (ref 26.0–34.0)
MCHC: 35.1 g/dL (ref 30.0–36.0)
MCV: 89.8 fL (ref 80.0–100.0)
Monocytes Absolute: 0.3 10*3/uL (ref 0.1–1.0)
Monocytes Relative: 12 %
Neutro Abs: 1.6 10*3/uL — ABNORMAL LOW (ref 1.7–7.7)
Neutrophils Relative %: 64 %
Platelets: 122 10*3/uL — ABNORMAL LOW (ref 150–400)
RBC: 3.81 MIL/uL — ABNORMAL LOW (ref 3.87–5.11)
RDW: 12 % (ref 11.5–15.5)
WBC: 2.5 10*3/uL — ABNORMAL LOW (ref 4.0–10.5)
nRBC: 0 % (ref 0.0–0.2)

## 2021-07-29 LAB — COMPREHENSIVE METABOLIC PANEL
ALT: 65 U/L — ABNORMAL HIGH (ref 0–44)
AST: 38 U/L (ref 15–41)
Albumin: 3.3 g/dL — ABNORMAL LOW (ref 3.5–5.0)
Alkaline Phosphatase: 74 U/L (ref 38–126)
Anion gap: 7 (ref 5–15)
BUN: 17 mg/dL (ref 6–20)
CO2: 25 mmol/L (ref 22–32)
Calcium: 8.2 mg/dL — ABNORMAL LOW (ref 8.9–10.3)
Chloride: 101 mmol/L (ref 98–111)
Creatinine, Ser: 0.74 mg/dL (ref 0.44–1.00)
GFR, Estimated: >60 mL/min (ref >60)
Glucose, Bld: 95 mg/dL (ref 70–99)
Potassium: 4.1 mmol/L (ref 3.5–5.1)
Sodium: 133 mmol/L — ABNORMAL LOW (ref 135–145)
Total Bilirubin: 0.3 mg/dL (ref 0.3–1.2)
Total Protein: 6 g/dL — ABNORMAL LOW (ref 6.5–8.1)

## 2021-07-29 LAB — RAD ONC ARIA SESSION SUMMARY
Course Elapsed Days: 20
Plan Fractions Treated to Date: 11
Plan Prescribed Dose Per Fraction: 2 Gy
Plan Total Fractions Prescribed: 27
Plan Total Prescribed Dose: 54 Gy
Reference Point Dosage Given to Date: 28 Gy
Reference Point Session Dosage Given: 2 Gy
Session Number: 14

## 2021-07-29 LAB — MAGNESIUM: Magnesium: 1.8 mg/dL (ref 1.7–2.4)

## 2021-07-29 MED ORDER — SODIUM CHLORIDE 0.9 % IV SOLN
Freq: Once | INTRAVENOUS | Status: AC
  Filled 2021-07-29: qty 250

## 2021-07-29 MED ORDER — PALONOSETRON HCL INJECTION 0.25 MG/5ML
0.2500 mg | Freq: Once | INTRAVENOUS | Status: AC
  Administered 2021-07-29: 0.25 mg via INTRAVENOUS
  Filled 2021-07-29: qty 5

## 2021-07-29 MED ORDER — SODIUM CHLORIDE 0.9% FLUSH
10.0000 mL | INTRAVENOUS | Status: DC | PRN
  Administered 2021-07-29: 10 mL
  Filled 2021-07-29: qty 10

## 2021-07-29 MED ORDER — SODIUM CHLORIDE 0.9 % IV SOLN
150.0000 mg | Freq: Once | INTRAVENOUS | Status: AC
  Administered 2021-07-29: 150 mg via INTRAVENOUS
  Filled 2021-07-29: qty 150

## 2021-07-29 MED ORDER — MAGNESIUM SULFATE 2 GM/50ML IV SOLN
2.0000 g | Freq: Once | INTRAVENOUS | Status: AC
  Administered 2021-07-29: 2 g via INTRAVENOUS
  Filled 2021-07-29: qty 50

## 2021-07-29 MED ORDER — HEPARIN SOD (PORK) LOCK FLUSH 100 UNIT/ML IV SOLN
500.0000 [IU] | Freq: Once | INTRAVENOUS | Status: AC | PRN
  Administered 2021-07-29: 500 [IU]
  Filled 2021-07-29: qty 5

## 2021-07-29 MED ORDER — POTASSIUM CHLORIDE IN NACL 20-0.9 MEQ/L-% IV SOLN
Freq: Once | INTRAVENOUS | Status: AC
  Filled 2021-07-29: qty 1000

## 2021-07-29 MED ORDER — SODIUM CHLORIDE 0.9 % IV SOLN
10.0000 mg | Freq: Once | INTRAVENOUS | Status: AC
  Administered 2021-07-29: 10 mg via INTRAVENOUS
  Filled 2021-07-29: qty 10

## 2021-07-29 MED ORDER — SODIUM CHLORIDE 0.9 % IV SOLN
40.0000 mg/m2 | Freq: Once | INTRAVENOUS | Status: AC
  Administered 2021-07-29: 84 mg via INTRAVENOUS
  Filled 2021-07-29: qty 84

## 2021-07-29 NOTE — Patient Instructions (Signed)
[facility] CANCER CTR AT [place]  Discharge Instructions: Thank you for choosing [facility] to provide your oncology and hematology care.  If you have a lab appointment with the [place], please go directly to the [place] and check in at the registration area.  Wear comfortable clothing and clothing appropriate for easy access to any Portacath or PICC line.   We strive to give you quality time with your provider. You may need to reschedule your appointment if you arrive late (15 or more minutes).  Arriving late affects you and other patients whose appointments are after yours.  Also, if you miss three or more appointments without notifying the office, you may be dismissed from the clinic at the provider's discretion.      For prescription refill requests, have your pharmacy contact our office and allow 72 hours for refills to be completed.    Today you received the following chemotherapy and/or immunotherapy agents: CISPLATIN.   To help prevent nausea and vomiting after your treatment, we encourage you to take your nausea medication as directed.  BELOW ARE SYMPTOMS THAT SHOULD BE REPORTED IMMEDIATELY: *FEVER GREATER THAN 100.4 F (38 C) OR HIGHER *CHILLS OR SWEATING *NAUSEA AND VOMITING THAT IS NOT CONTROLLED WITH YOUR NAUSEA MEDICATION *UNUSUAL SHORTNESS OF BREATH *UNUSUAL BRUISING OR BLEEDING *URINARY PROBLEMS (pain or burning when urinating, or frequent urination) *BOWEL PROBLEMS (unusual diarrhea, constipation, pain near the anus) TENDERNESS IN MOUTH AND THROAT WITH OR WITHOUT PRESENCE OF ULCERS (sore throat, sores in mouth, or a toothache) UNUSUAL RASH, SWELLING OR PAIN  UNUSUAL VAGINAL DISCHARGE OR ITCHING   Items with * indicate a potential emergency and should be followed up as soon as possible or go to the Emergency Department if any problems should occur.  Please show the CHEMOTHERAPY ALERT CARD or IMMUNOTHERAPY ALERT CARD at check-in to  the Emergency Department and triage nurse.  Should you have questions after your visit or need to cancel or reschedule your appointment, please contact [facility] CANCER [place] AT [place]  [phone] and follow the prompts.  Office hours are 8:00 a.m. to 4:30 p.m. Monday - Friday. Please note that voicemails left after 4:00 p.m. may not be returned until the following business day.  We are closed weekends and major holidays. You have access to a nurse at all times for urgent questions. Please call the main number to the clinic [phone] and follow the prompts.  For any non-urgent questions, you may also contact your provider using MyChart. We now offer e-Visits for anyone 53 and older to request care online for non-urgent symptoms. For details visit [email or web address].   Also download the MyChart app! Go to the app store, search "MyChart", open the app, select [place], and log in with your MyChart username and password.  Due to Covid, a mask is required upon entering the hospital/clinic. If you do not have a mask, one will be given to you upon arrival. For doctor visits, patients may have 1 support person aged 23 or older with them. For treatment visits, patients cannot have anyone with them due to current Covid guidelines and our immunocompromised population.

## 2021-07-30 ENCOUNTER — Other Ambulatory Visit: Payer: Self-pay | Admitting: *Deleted

## 2021-07-30 ENCOUNTER — Ambulatory Visit
Admission: RE | Admit: 2021-07-30 | Discharge: 2021-07-30 | Disposition: A | Discharge status: Home / Self Care | Payer: BC Managed Care – PPO | Source: Ambulatory Visit | Attending: Radiation Oncology | Admitting: Radiation Oncology

## 2021-07-30 ENCOUNTER — Other Ambulatory Visit: Payer: Self-pay

## 2021-07-30 DIAGNOSIS — C531 Malignant neoplasm of exocervix: Secondary | ICD-10-CM | POA: Diagnosis not present

## 2021-07-30 DIAGNOSIS — Z510 Encounter for antineoplastic radiation therapy: Secondary | ICD-10-CM | POA: Diagnosis not present

## 2021-07-30 LAB — RAD ONC ARIA SESSION SUMMARY
Course Elapsed Days: 21
Plan Fractions Treated to Date: 12
Plan Prescribed Dose Per Fraction: 2 Gy
Plan Total Fractions Prescribed: 27
Plan Total Prescribed Dose: 54 Gy
Reference Point Dosage Given to Date: 30 Gy
Reference Point Session Dosage Given: 2 Gy
Session Number: 15

## 2021-07-31 ENCOUNTER — Ambulatory Visit
Admission: RE | Admit: 2021-07-31 | Discharge: 2021-07-31 | Disposition: A | Discharge status: Home / Self Care | Payer: BC Managed Care – PPO | Source: Ambulatory Visit | Attending: Radiation Oncology | Admitting: Radiation Oncology

## 2021-07-31 ENCOUNTER — Other Ambulatory Visit: Payer: Self-pay

## 2021-07-31 DIAGNOSIS — C531 Malignant neoplasm of exocervix: Secondary | ICD-10-CM | POA: Diagnosis not present

## 2021-07-31 DIAGNOSIS — Z510 Encounter for antineoplastic radiation therapy: Secondary | ICD-10-CM | POA: Diagnosis not present

## 2021-07-31 LAB — RAD ONC ARIA SESSION SUMMARY
Course Elapsed Days: 22
Plan Fractions Treated to Date: 13
Plan Prescribed Dose Per Fraction: 2 Gy
Plan Total Fractions Prescribed: 27
Plan Total Prescribed Dose: 54 Gy
Reference Point Dosage Given to Date: 32 Gy
Reference Point Session Dosage Given: 2 Gy
Session Number: 16

## 2021-08-01 ENCOUNTER — Ambulatory Visit
Admission: RE | Admit: 2021-08-01 | Discharge: 2021-08-01 | Disposition: A | Discharge status: Home / Self Care | Payer: BC Managed Care – PPO | Source: Ambulatory Visit | Attending: Radiation Oncology | Admitting: Radiation Oncology

## 2021-08-01 ENCOUNTER — Other Ambulatory Visit: Payer: Self-pay

## 2021-08-01 DIAGNOSIS — C531 Malignant neoplasm of exocervix: Secondary | ICD-10-CM | POA: Diagnosis not present

## 2021-08-01 DIAGNOSIS — Z510 Encounter for antineoplastic radiation therapy: Secondary | ICD-10-CM | POA: Diagnosis not present

## 2021-08-01 LAB — RAD ONC ARIA SESSION SUMMARY
Course Elapsed Days: 23
Plan Fractions Treated to Date: 14
Plan Prescribed Dose Per Fraction: 2 Gy
Plan Total Fractions Prescribed: 27
Plan Total Prescribed Dose: 54 Gy
Reference Point Dosage Given to Date: 34 Gy
Reference Point Session Dosage Given: 2 Gy
Session Number: 17

## 2021-08-04 ENCOUNTER — Encounter: Payer: Self-pay | Admitting: Nurse Practitioner

## 2021-08-04 ENCOUNTER — Inpatient Hospital Stay: Payer: BC Managed Care – PPO

## 2021-08-04 ENCOUNTER — Encounter: Payer: Self-pay | Admitting: Oncology

## 2021-08-04 ENCOUNTER — Other Ambulatory Visit: Payer: Self-pay

## 2021-08-04 ENCOUNTER — Ambulatory Visit
Admission: RE | Admit: 2021-08-04 | Discharge: 2021-08-04 | Disposition: A | Discharge status: Home / Self Care | Payer: BC Managed Care – PPO | Source: Ambulatory Visit | Attending: Radiation Oncology | Admitting: Radiation Oncology

## 2021-08-04 ENCOUNTER — Inpatient Hospital Stay (HOSPITAL_BASED_OUTPATIENT_CLINIC_OR_DEPARTMENT_OTHER): Payer: BC Managed Care – PPO | Admitting: Oncology

## 2021-08-04 VITALS — BP 125/84 | HR 76 | Temp 96.2°F | Resp 20 | Wt 213.0 lb

## 2021-08-04 DIAGNOSIS — C531 Malignant neoplasm of exocervix: Secondary | ICD-10-CM | POA: Diagnosis not present

## 2021-08-04 DIAGNOSIS — Z5111 Encounter for antineoplastic chemotherapy: Secondary | ICD-10-CM | POA: Diagnosis not present

## 2021-08-04 DIAGNOSIS — C539 Malignant neoplasm of cervix uteri, unspecified: Secondary | ICD-10-CM

## 2021-08-04 DIAGNOSIS — D701 Agranulocytosis secondary to cancer chemotherapy: Secondary | ICD-10-CM

## 2021-08-04 DIAGNOSIS — Z95828 Presence of other vascular implants and grafts: Secondary | ICD-10-CM

## 2021-08-04 DIAGNOSIS — D6959 Other secondary thrombocytopenia: Secondary | ICD-10-CM

## 2021-08-04 DIAGNOSIS — T451X5A Adverse effect of antineoplastic and immunosuppressive drugs, initial encounter: Secondary | ICD-10-CM

## 2021-08-04 DIAGNOSIS — Z510 Encounter for antineoplastic radiation therapy: Secondary | ICD-10-CM | POA: Diagnosis not present

## 2021-08-04 LAB — RAD ONC ARIA SESSION SUMMARY
Course Elapsed Days: 26
Plan Fractions Treated to Date: 15
Plan Prescribed Dose Per Fraction: 2 Gy
Plan Total Fractions Prescribed: 27
Plan Total Prescribed Dose: 54 Gy
Reference Point Dosage Given to Date: 36 Gy
Reference Point Session Dosage Given: 2 Gy
Session Number: 18

## 2021-08-04 LAB — CBC WITH DIFFERENTIAL/PLATELET
Abs Immature Granulocytes: 0.01 10*3/uL (ref 0.00–0.07)
Basophils Absolute: 0 10*3/uL (ref 0.0–0.1)
Basophils Relative: 1 %
Eosinophils Absolute: 0.1 10*3/uL (ref 0.0–0.5)
Eosinophils Relative: 2 %
HCT: 33.5 % — ABNORMAL LOW (ref 36.0–46.0)
Hemoglobin: 11.9 g/dL — ABNORMAL LOW (ref 12.0–15.0)
Immature Granulocytes: 1 %
Lymphocytes Relative: 22 %
Lymphs Abs: 0.4 10*3/uL — ABNORMAL LOW (ref 0.7–4.0)
MCH: 31.7 pg (ref 26.0–34.0)
MCHC: 35.5 g/dL (ref 30.0–36.0)
MCV: 89.3 fL (ref 80.0–100.0)
Monocytes Absolute: 0.2 10*3/uL (ref 0.1–1.0)
Monocytes Relative: 10 %
Neutro Abs: 1.3 10*3/uL — ABNORMAL LOW (ref 1.7–7.7)
Neutrophils Relative %: 64 %
Platelets: 99 10*3/uL — ABNORMAL LOW (ref 150–400)
RBC: 3.75 MIL/uL — ABNORMAL LOW (ref 3.87–5.11)
RDW: 12.7 % (ref 11.5–15.5)
WBC: 2.1 10*3/uL — ABNORMAL LOW (ref 4.0–10.5)
nRBC: 0 % (ref 0.0–0.2)

## 2021-08-04 LAB — COMPREHENSIVE METABOLIC PANEL
ALT: 91 U/L — ABNORMAL HIGH (ref 0–44)
AST: 34 U/L (ref 15–41)
Albumin: 3.5 g/dL (ref 3.5–5.0)
Alkaline Phosphatase: 80 U/L (ref 38–126)
Anion gap: 5 (ref 5–15)
BUN: 11 mg/dL (ref 6–20)
CO2: 27 mmol/L (ref 22–32)
Calcium: 8.7 mg/dL — ABNORMAL LOW (ref 8.9–10.3)
Chloride: 102 mmol/L (ref 98–111)
Creatinine, Ser: 0.59 mg/dL (ref 0.44–1.00)
GFR, Estimated: >60 mL/min (ref >60)
Glucose, Bld: 98 mg/dL (ref 70–99)
Potassium: 4.1 mmol/L (ref 3.5–5.1)
Sodium: 134 mmol/L — ABNORMAL LOW (ref 135–145)
Total Bilirubin: 0.5 mg/dL (ref 0.3–1.2)
Total Protein: 6.4 g/dL — ABNORMAL LOW (ref 6.5–8.1)

## 2021-08-04 LAB — MAGNESIUM: Magnesium: 1.8 mg/dL (ref 1.7–2.4)

## 2021-08-04 MED ORDER — HEPARIN SOD (PORK) LOCK FLUSH 100 UNIT/ML IV SOLN
500.0000 [IU] | Freq: Once | INTRAVENOUS | Status: AC
  Administered 2021-08-04: 500 [IU] via INTRAVENOUS
  Filled 2021-08-04: qty 5

## 2021-08-04 NOTE — Progress Notes (Signed)
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  She was seen by GYN and had a cervical exam which showed a large pink friable vascularized irregular growth in the cervix extending from 11:00 to 5 o'clock position.  Pap showed ASCUS and high risk HPV.  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  She is married and uses NuvaRing for contraception.  She does not have any children yet.  She has had 2 Gardasil vaccinations in the past but did not receive a booster patient was seen by GYN oncology Dr. Fransisca Connors and plan was to obtain a PET CT scan to decide if she would be a candidate for surgery.  PET scan showed hypermetabolic activity of the cervix compatible with primary malignancy.  Hypermetabolic subcentimeter bilateral external iliac lymph nodes concerning for metastatic disease.  Hypermetabolic left inguinal lymph node concerning for additional site of metastatic disease.  Bilateral nonobstructing renal stones.   Plan is to proceed with definitive concurrent chemoradiation with weekly cisplatin which she stopped on 07/07/2021  Interval history-tolerating chemoradiation well so far.  Denies any urinary complaints.  Denies any vaginal burning or irritation.  ECOG PS- 0 Pain scale- 0 Opioid associated constipation- no  Review of  systems- Review of Systems  Constitutional:  Negative for chills, fever, malaise/fatigue and weight loss.  HENT:  Negative for congestion, ear discharge and nosebleeds.   Eyes:  Negative for blurred vision.  Respiratory:  Negative for cough, hemoptysis, sputum production, shortness of breath and wheezing.   Cardiovascular:  Negative for chest pain, palpitations, orthopnea and claudication.  Gastrointestinal:  Negative for abdominal pain, blood in stool, constipation, diarrhea, heartburn, melena, nausea and vomiting.  Genitourinary:  Negative for dysuria, flank pain, frequency, hematuria and urgency.  Musculoskeletal:  Negative for back pain, joint pain and myalgias.  Skin:  Negative for rash.  Neurological:  Negative for dizziness, tingling, focal weakness, seizures, weakness and headaches.  Endo/Heme/Allergies:  Does not bruise/bleed easily.  Psychiatric/Behavioral:  Negative for depression and suicidal ideas. The patient does not have insomnia.       No Known Allergies   Past Medical History:  Diagnosis Date   Allergic rhinitis    Allergy    Cervical cancer (HCC)      Past Surgical History:  Procedure Laterality Date   IR IMAGING GUIDED PORT INSERTION  06/30/2021   TONSILECTOMY, ADENOIDECTOMY, BILATERAL MYRINGOTOMY AND TUBES Bilateral    TONSILLECTOMY     WISDOM TOOTH EXTRACTION      Social History   Socioeconomic History   Marital status: Married    Spouse name: Not on file   Number of children: Not on file   Years of education: Not on file   Highest education level: Not on file  Occupational History   Not on file  Tobacco Use   Smoking status: Never   Smokeless tobacco: Never  Vaping Use   Vaping  Use: Never used  Substance and Sexual Activity   Alcohol use: Yes    Alcohol/week: 1.0 standard drink of alcohol    Types: 1 Glasses of wine per week    Comment: occasional--every couple of weeks.   Drug use: No   Sexual activity: Yes  Other Topics Concern   Not on  file  Social History Narrative   Not on file   Social Determinants of Health   Financial Resource Strain: Not on file  Food Insecurity: Not on file  Transportation Needs: Not on file  Physical Activity: Not on file  Stress: Not on file  Social Connections: Not on file  Intimate Partner Violence: Not on file    Family History  Problem Relation Age of Onset   Heart disease Father    Breast cancer Maternal Grandmother    Lung cancer Paternal Grandmother    Heart attack Paternal Grandfather      Current Outpatient Medications:    lidocaine-prilocaine (EMLA) cream, Apply to affected area once, Disp: 30 g, Rfl: 3   acetaminophen (TYLENOL) 500 MG tablet, Take 500 mg by mouth every 6 (six) hours as needed. (Patient not taking: Reported on 08/04/2021), Disp: , Rfl:    aspirin-acetaminophen-caffeine (EXCEDRIN MIGRAINE) 250-250-65 MG tablet, Take by mouth every 6 (six) hours as needed for headache. (Patient not taking: Reported on 07/22/2021), Disp: , Rfl:    dexamethasone (DECADRON) 4 MG tablet, Take 2 tablets (8 mg total) by mouth daily. Take daily x 3 days starting the day after cisplatin chemotherapy. Take with food. (Patient not taking: Reported on 07/07/2021), Disp: 30 tablet, Rfl: 1   escitalopram (LEXAPRO) 10 MG tablet, Take 10 mg by mouth daily. (Patient not taking: Reported on 06/24/2021), Disp: , Rfl:    etonogestrel-ethinyl estradiol (NUVARING) 0.12-0.015 MG/24HR vaginal ring, Place vaginally. (Patient not taking: Reported on 07/07/2021), Disp: , Rfl:    LORazepam (ATIVAN) 0.5 MG tablet, Take 1 tablet (0.5 mg total) by mouth every 6 (six) hours as needed (Nausea or vomiting). (Patient not taking: Reported on 07/07/2021), Disp: 30 tablet, Rfl: 0   ondansetron (ZOFRAN) 8 MG tablet, Take 1 tablet (8 mg total) by mouth 2 (two) times daily as needed. Start on the third day after cisplatin chemotherapy. (Patient not taking: Reported on 07/07/2021), Disp: 30 tablet, Rfl: 1   prochlorperazine  (COMPAZINE) 10 MG tablet, Take 1 tablet (10 mg total) by mouth every 6 (six) hours as needed (Nausea or vomiting). (Patient not taking: Reported on 07/07/2021), Disp: 30 tablet, Rfl: 1   Simethicone (GAS-X MAXIMUM STRENGTH PO), Take by mouth. (Patient not taking: Reported on 08/04/2021), Disp: , Rfl:  No current facility-administered medications for this visit.  Facility-Administered Medications Ordered in Other Visits:    sodium chloride flush (NS) 0.9 % injection 10 mL, 10 mL, Intravenous, PRN, Sindy Guadeloupe, MD, 10 mL at 07/07/21 0841  Physical exam:  Vitals:   08/04/21 1019  BP: 125/84  Pulse: 76  Resp: 20  Temp: (!) 96.2 F (35.7 C)  SpO2: 99%  Weight: 213 lb (96.6 kg)   Physical Exam Cardiovascular:     Rate and Rhythm: Normal rate and regular rhythm.     Heart sounds: Normal heart sounds.  Pulmonary:     Effort: Pulmonary effort is normal.     Breath sounds: Normal breath sounds.  Skin:    General: Skin is warm and dry.  Neurological:     Mental Status: She is alert and oriented to person, place, and  time.         Latest Ref Rng & Units 08/04/2021   10:02 AM  CMP  Glucose 70 - 99 mg/dL 98   BUN 6 - 20 mg/dL 11   Creatinine 0.44 - 1.00 mg/dL 0.59   Sodium 135 - 145 mmol/L 134   Potassium 3.5 - 5.1 mmol/L 4.1   Chloride 98 - 111 mmol/L 102   CO2 22 - 32 mmol/L 27   Calcium 8.9 - 10.3 mg/dL 8.7   Total Protein 6.5 - 8.1 g/dL 6.4   Total Bilirubin 0.3 - 1.2 mg/dL 0.5   Alkaline Phos 38 - 126 U/L 80   AST 15 - 41 U/L 34   ALT 0 - 44 U/L 91       Latest Ref Rng & Units 08/04/2021   10:02 AM  CBC  WBC 4.0 - 10.5 K/uL 2.1   Hemoglobin 12.0 - 15.0 g/dL 11.9   Hematocrit 36.0 - 46.0 % 33.5   Platelets 150 - 400 K/uL 99     Assessment and plan- Patient is a 31 y.o. female stage IIIc HPV positive cervical cancer.  She is here for on treatment assessment prior to cycle 5 of weekly cisplatin chemotherapy  Patient's white cell count is 2.1 today with an ANC of 1.3.   Platelets are slightly lower at 99.  I am lowering her cisplatin dose from 40 to 35 mg per metered square and zarxio tomorrow.  She is she will be proceeding with cycle 6 of weekly cisplatin chemotherapy next week which would be her last cycle.  She completes radiation treatment in about 10 days.  Zarzio was also approved by her insurance today and therefore patient will receive Zarxio on day 2-day 3 and day 4.  She will be completing radiation treatment and then go to [facility] for consideration for brachytherapy.  I am tentatively scheduling her PET scan about 3-1/2 months from now which will need to be adjusted based on when she finishes her brachytherapy.  Typically PET scan is done 8 to 12 weeks after finishing radiation treatment.  Patient verbalized understanding of the plan   Visit Diagnosis 1. Cervical cancer, FIGO stage III ([place])   2. Encounter for antineoplastic chemotherapy   3. Chemotherapy induced neutropenia (HCC)   4. Chemotherapy-induced thrombocytopenia      Dr. Randa Evens, MD, MPH [facility] at [facility] [phone] 08/04/2021 4:23 PM

## 2021-08-04 NOTE — Progress Notes (Signed)
Completed peer to peer for GCSF. Approved.

## 2021-08-05 ENCOUNTER — Ambulatory Visit
Admission: RE | Admit: 2021-08-05 | Discharge: 2021-08-05 | Disposition: A | Discharge status: Home / Self Care | Payer: BC Managed Care – PPO | Source: Ambulatory Visit | Attending: Radiation Oncology | Admitting: Radiation Oncology

## 2021-08-05 ENCOUNTER — Other Ambulatory Visit: Payer: Self-pay

## 2021-08-05 ENCOUNTER — Inpatient Hospital Stay: Payer: BC Managed Care – PPO

## 2021-08-05 VITALS — BP 130/85 | HR 82 | Temp 98.6°F | Resp 18

## 2021-08-05 DIAGNOSIS — C539 Malignant neoplasm of cervix uteri, unspecified: Secondary | ICD-10-CM

## 2021-08-05 DIAGNOSIS — C531 Malignant neoplasm of exocervix: Secondary | ICD-10-CM | POA: Diagnosis not present

## 2021-08-05 DIAGNOSIS — Z510 Encounter for antineoplastic radiation therapy: Secondary | ICD-10-CM | POA: Diagnosis not present

## 2021-08-05 LAB — RAD ONC ARIA SESSION SUMMARY
Course Elapsed Days: 27
Plan Fractions Treated to Date: 16
Plan Prescribed Dose Per Fraction: 2 Gy
Plan Total Fractions Prescribed: 27
Plan Total Prescribed Dose: 54 Gy
Reference Point Dosage Given to Date: 38 Gy
Reference Point Session Dosage Given: 2 Gy
Session Number: 19

## 2021-08-05 MED ORDER — SODIUM CHLORIDE 0.9 % IV SOLN
Freq: Once | INTRAVENOUS | Status: AC
  Filled 2021-08-05: qty 250

## 2021-08-05 MED ORDER — PALONOSETRON HCL INJECTION 0.25 MG/5ML
0.2500 mg | Freq: Once | INTRAVENOUS | Status: AC
  Administered 2021-08-05: 0.25 mg via INTRAVENOUS
  Filled 2021-08-05: qty 5

## 2021-08-05 MED ORDER — SODIUM CHLORIDE 0.9 % IV SOLN
35.0000 mg/m2 | Freq: Once | INTRAVENOUS | Status: AC
  Administered 2021-08-05: 74 mg via INTRAVENOUS
  Filled 2021-08-05: qty 74

## 2021-08-05 MED ORDER — MAGNESIUM SULFATE 2 GM/50ML IV SOLN
2.0000 g | Freq: Once | INTRAVENOUS | Status: AC
  Administered 2021-08-05: 2 g via INTRAVENOUS
  Filled 2021-08-05: qty 50

## 2021-08-05 MED ORDER — SODIUM CHLORIDE 0.9 % IV SOLN
150.0000 mg | Freq: Once | INTRAVENOUS | Status: AC
  Administered 2021-08-05: 150 mg via INTRAVENOUS
  Filled 2021-08-05: qty 150

## 2021-08-05 MED ORDER — HEPARIN SOD (PORK) LOCK FLUSH 100 UNIT/ML IV SOLN
500.0000 [IU] | Freq: Once | INTRAVENOUS | Status: AC | PRN
  Administered 2021-08-05: 500 [IU]
  Filled 2021-08-05: qty 5

## 2021-08-05 MED ORDER — POTASSIUM CHLORIDE IN NACL 20-0.9 MEQ/L-% IV SOLN
Freq: Once | INTRAVENOUS | Status: AC
  Filled 2021-08-05: qty 1000

## 2021-08-05 MED ORDER — SODIUM CHLORIDE 0.9 % IV SOLN
10.0000 mg | Freq: Once | INTRAVENOUS | Status: AC
  Administered 2021-08-05: 10 mg via INTRAVENOUS
  Filled 2021-08-05: qty 10

## 2021-08-05 NOTE — Patient Instructions (Signed)
[facility]  Discharge Instructions: Thank you for choosing [facility] to provide your oncology and hematology care.   If you have a lab appointment with the Cancer Center, please go directly to the Cancer Center and check in at the registration area.   Wear comfortable clothing and clothing appropriate for easy access to any Portacath or PICC line.   We strive to give you quality time with your provider. You may need to reschedule your appointment if you arrive late (15 or more minutes).  Arriving late affects you and other patients whose appointments are after yours.  Also, if you miss three or more appointments without notifying the office, you may be dismissed from the clinic at the provider's discretion.      For prescription refill requests, have your pharmacy contact our office and allow 72 hours for refills to be completed.    Today you received the following chemotherapy and/or immunotherapy agents       To help prevent nausea and vomiting after your treatment, we encourage you to take your nausea medication as directed.  BELOW ARE SYMPTOMS THAT SHOULD BE REPORTED IMMEDIATELY: *FEVER GREATER THAN 100.4 F (38 C) OR HIGHER *CHILLS OR SWEATING *NAUSEA AND VOMITING THAT IS NOT CONTROLLED WITH YOUR NAUSEA MEDICATION *UNUSUAL SHORTNESS OF BREATH *UNUSUAL BRUISING OR BLEEDING *URINARY PROBLEMS (pain or burning when urinating, or frequent urination) *BOWEL PROBLEMS (unusual diarrhea, constipation, pain near the anus) TENDERNESS IN MOUTH AND THROAT WITH OR WITHOUT PRESENCE OF ULCERS (sore throat, sores in mouth, or a toothache) UNUSUAL RASH, SWELLING OR PAIN  UNUSUAL VAGINAL DISCHARGE OR ITCHING   Items with * indicate a potential emergency and should be followed up as soon as possible or go to the Emergency Department if any problems should occur.  Please show the CHEMOTHERAPY ALERT CARD or IMMUNOTHERAPY ALERT CARD at check-in to the  Emergency Department and triage nurse.  Should you have questions after your visit or need to cancel or reschedule your appointment, please contact [facility]  Dept: [phone]  and follow the prompts.  Office hours are 8:00 a.m. to 4:30 p.m. Monday - Friday. Please note that voicemails left after 4:00 p.m. may not be returned until the following business day.  We are closed weekends and major holidays. You have access to a nurse at all times for urgent questions. Please call the main number to the clinic Dept: [phone] and follow the prompts.   For any non-urgent questions, you may also contact your provider using MyChart. We now offer e-Visits for anyone 18 and older to request care online for non-urgent symptoms. For details visit [email or web address].   Also download the MyChart app! Go to the app store, search "MyChart", open the app, select [facility], and log in with your MyChart username and password.  Masks are optional in the cancer centers. If you would like for your care team to wear a mask while they are taking care of you, please let them know. For doctor visits, patients may have with them one support person who is at least 30 years old. At this time, visitors are not allowed in the infusion area. 

## 2021-08-06 ENCOUNTER — Other Ambulatory Visit: Payer: Self-pay

## 2021-08-06 ENCOUNTER — Ambulatory Visit
Admission: RE | Admit: 2021-08-06 | Discharge: 2021-08-06 | Disposition: A | Discharge status: Home / Self Care | Payer: BC Managed Care – PPO | Source: Ambulatory Visit | Attending: Radiation Oncology | Admitting: Radiation Oncology

## 2021-08-06 ENCOUNTER — Inpatient Hospital Stay: Payer: BC Managed Care – PPO

## 2021-08-06 DIAGNOSIS — Z510 Encounter for antineoplastic radiation therapy: Secondary | ICD-10-CM | POA: Diagnosis not present

## 2021-08-06 DIAGNOSIS — C539 Malignant neoplasm of cervix uteri, unspecified: Secondary | ICD-10-CM

## 2021-08-06 DIAGNOSIS — C531 Malignant neoplasm of exocervix: Secondary | ICD-10-CM | POA: Diagnosis not present

## 2021-08-06 LAB — RAD ONC ARIA SESSION SUMMARY
Course Elapsed Days: 28
Plan Fractions Treated to Date: 17
Plan Prescribed Dose Per Fraction: 2 Gy
Plan Total Fractions Prescribed: 27
Plan Total Prescribed Dose: 54 Gy
Reference Point Dosage Given to Date: 40 Gy
Reference Point Session Dosage Given: 2 Gy
Session Number: 20

## 2021-08-06 MED ORDER — FILGRASTIM-SNDZ 480 MCG/0.8ML IJ SOSY
480.0000 ug | PREFILLED_SYRINGE | Freq: Once | INTRAMUSCULAR | Status: AC
  Administered 2021-08-06: 480 ug via SUBCUTANEOUS
  Filled 2021-08-06: qty 0.8

## 2021-08-07 ENCOUNTER — Other Ambulatory Visit: Payer: Self-pay

## 2021-08-07 ENCOUNTER — Inpatient Hospital Stay: Payer: BC Managed Care – PPO

## 2021-08-07 ENCOUNTER — Ambulatory Visit
Admission: RE | Admit: 2021-08-07 | Discharge: 2021-08-07 | Disposition: A | Discharge status: Home / Self Care | Payer: BC Managed Care – PPO | Source: Ambulatory Visit | Attending: Radiation Oncology | Admitting: Radiation Oncology

## 2021-08-07 DIAGNOSIS — C531 Malignant neoplasm of exocervix: Secondary | ICD-10-CM | POA: Diagnosis not present

## 2021-08-07 DIAGNOSIS — C539 Malignant neoplasm of cervix uteri, unspecified: Secondary | ICD-10-CM

## 2021-08-07 DIAGNOSIS — Z510 Encounter for antineoplastic radiation therapy: Secondary | ICD-10-CM | POA: Diagnosis not present

## 2021-08-07 LAB — RAD ONC ARIA SESSION SUMMARY
Course Elapsed Days: 29
Plan Fractions Treated to Date: 18
Plan Prescribed Dose Per Fraction: 2 Gy
Plan Total Fractions Prescribed: 27
Plan Total Prescribed Dose: 54 Gy
Reference Point Dosage Given to Date: 42 Gy
Reference Point Session Dosage Given: 2 Gy
Session Number: 21

## 2021-08-07 MED ORDER — FILGRASTIM-SNDZ 480 MCG/0.8ML IJ SOSY
480.0000 ug | PREFILLED_SYRINGE | Freq: Once | INTRAMUSCULAR | Status: AC
  Administered 2021-08-07: 480 ug via SUBCUTANEOUS
  Filled 2021-08-07: qty 0.8

## 2021-08-08 ENCOUNTER — Other Ambulatory Visit: Payer: Self-pay

## 2021-08-08 ENCOUNTER — Inpatient Hospital Stay: Payer: BC Managed Care – PPO

## 2021-08-08 ENCOUNTER — Ambulatory Visit
Admission: RE | Admit: 2021-08-08 | Discharge: 2021-08-08 | Disposition: A | Discharge status: Home / Self Care | Payer: BC Managed Care – PPO | Source: Ambulatory Visit | Attending: Radiation Oncology | Admitting: Radiation Oncology

## 2021-08-08 DIAGNOSIS — C539 Malignant neoplasm of cervix uteri, unspecified: Secondary | ICD-10-CM

## 2021-08-08 DIAGNOSIS — Z510 Encounter for antineoplastic radiation therapy: Secondary | ICD-10-CM | POA: Diagnosis not present

## 2021-08-08 DIAGNOSIS — C531 Malignant neoplasm of exocervix: Secondary | ICD-10-CM | POA: Diagnosis not present

## 2021-08-08 LAB — RAD ONC ARIA SESSION SUMMARY
Course Elapsed Days: 30
Plan Fractions Treated to Date: 19
Plan Prescribed Dose Per Fraction: 2 Gy
Plan Total Fractions Prescribed: 27
Plan Total Prescribed Dose: 54 Gy
Reference Point Dosage Given to Date: 44 Gy
Reference Point Session Dosage Given: 2 Gy
Session Number: 22

## 2021-08-08 MED ORDER — FILGRASTIM-SNDZ 480 MCG/0.8ML IJ SOSY
480.0000 ug | PREFILLED_SYRINGE | Freq: Once | INTRAMUSCULAR | Status: AC
  Administered 2021-08-08: 480 ug via SUBCUTANEOUS
  Filled 2021-08-08: qty 0.8

## 2021-08-11 ENCOUNTER — Ambulatory Visit
Admission: RE | Admit: 2021-08-11 | Discharge: 2021-08-11 | Disposition: A | Discharge status: Home / Self Care | Payer: BC Managed Care – PPO | Source: Ambulatory Visit | Attending: Radiation Oncology | Admitting: Radiation Oncology

## 2021-08-11 ENCOUNTER — Other Ambulatory Visit: Payer: Self-pay

## 2021-08-11 DIAGNOSIS — C531 Malignant neoplasm of exocervix: Secondary | ICD-10-CM | POA: Diagnosis not present

## 2021-08-11 DIAGNOSIS — Z510 Encounter for antineoplastic radiation therapy: Secondary | ICD-10-CM | POA: Diagnosis not present

## 2021-08-11 LAB — RAD ONC ARIA SESSION SUMMARY
Course Elapsed Days: 33
Plan Fractions Treated to Date: 20
Plan Prescribed Dose Per Fraction: 2 Gy
Plan Total Fractions Prescribed: 27
Plan Total Prescribed Dose: 54 Gy
Reference Point Dosage Given to Date: 46 Gy
Reference Point Session Dosage Given: 2 Gy
Session Number: 23

## 2021-08-11 MED FILL — Dexamethasone Sodium Phosphate Inj 100 MG/10ML: INTRAMUSCULAR | Qty: 1 | Status: AC

## 2021-08-11 MED FILL — Fosaprepitant Dimeglumine For IV Infusion 150 MG (Base Eq): INTRAVENOUS | Qty: 5 | Status: AC

## 2021-08-12 ENCOUNTER — Inpatient Hospital Stay: Payer: BC Managed Care – PPO

## 2021-08-12 ENCOUNTER — Ambulatory Visit
Admission: RE | Admit: 2021-08-12 | Discharge: 2021-08-12 | Disposition: A | Discharge status: Home / Self Care | Payer: BC Managed Care – PPO | Source: Ambulatory Visit | Attending: Radiation Oncology | Admitting: Radiation Oncology

## 2021-08-12 ENCOUNTER — Other Ambulatory Visit: Payer: Self-pay

## 2021-08-12 VITALS — BP 150/91 | HR 88 | Temp 97.7°F | Resp 18 | Wt 216.9 lb

## 2021-08-12 DIAGNOSIS — Z510 Encounter for antineoplastic radiation therapy: Secondary | ICD-10-CM | POA: Diagnosis not present

## 2021-08-12 DIAGNOSIS — C539 Malignant neoplasm of cervix uteri, unspecified: Secondary | ICD-10-CM

## 2021-08-12 DIAGNOSIS — C531 Malignant neoplasm of exocervix: Secondary | ICD-10-CM | POA: Diagnosis not present

## 2021-08-12 LAB — RAD ONC ARIA SESSION SUMMARY
Course Elapsed Days: 34
Plan Fractions Treated to Date: 21
Plan Prescribed Dose Per Fraction: 2 Gy
Plan Total Fractions Prescribed: 27
Plan Total Prescribed Dose: 54 Gy
Reference Point Dosage Given to Date: 48 Gy
Reference Point Session Dosage Given: 2 Gy
Session Number: 24

## 2021-08-12 LAB — COMPREHENSIVE METABOLIC PANEL
ALT: 35 U/L (ref 0–44)
AST: 21 U/L (ref 15–41)
Albumin: 3.4 g/dL — ABNORMAL LOW (ref 3.5–5.0)
Alkaline Phosphatase: 78 U/L (ref 38–126)
Anion gap: 6 (ref 5–15)
BUN: 17 mg/dL (ref 6–20)
CO2: 24 mmol/L (ref 22–32)
Calcium: 8.7 mg/dL — ABNORMAL LOW (ref 8.9–10.3)
Chloride: 106 mmol/L (ref 98–111)
Creatinine, Ser: 0.66 mg/dL (ref 0.44–1.00)
GFR, Estimated: >60 mL/min (ref >60)
Glucose, Bld: 103 mg/dL — ABNORMAL HIGH (ref 70–99)
Potassium: 4 mmol/L (ref 3.5–5.1)
Sodium: 136 mmol/L (ref 135–145)
Total Bilirubin: 0.5 mg/dL (ref 0.3–1.2)
Total Protein: 6.3 g/dL — ABNORMAL LOW (ref 6.5–8.1)

## 2021-08-12 LAB — CBC WITH DIFFERENTIAL/PLATELET
Abs Immature Granulocytes: 0.02 10*3/uL (ref 0.00–0.07)
Basophils Absolute: 0 10*3/uL (ref 0.0–0.1)
Basophils Relative: 1 %
Eosinophils Absolute: 0 10*3/uL (ref 0.0–0.5)
Eosinophils Relative: 2 %
HCT: 29.4 % — ABNORMAL LOW (ref 36.0–46.0)
Hemoglobin: 10.3 g/dL — ABNORMAL LOW (ref 12.0–15.0)
Immature Granulocytes: 1 %
Lymphocytes Relative: 41 %
Lymphs Abs: 0.6 10*3/uL — ABNORMAL LOW (ref 0.7–4.0)
MCH: 31.7 pg (ref 26.0–34.0)
MCHC: 35 g/dL (ref 30.0–36.0)
MCV: 90.5 fL (ref 80.0–100.0)
Monocytes Absolute: 0.2 10*3/uL (ref 0.1–1.0)
Monocytes Relative: 12 %
Neutro Abs: 0.7 10*3/uL — ABNORMAL LOW (ref 1.7–7.7)
Neutrophils Relative %: 43 %
Platelets: 89 10*3/uL — ABNORMAL LOW (ref 150–400)
RBC: 3.25 MIL/uL — ABNORMAL LOW (ref 3.87–5.11)
RDW: 13.1 % (ref 11.5–15.5)
WBC: 1.6 10*3/uL — ABNORMAL LOW (ref 4.0–10.5)
nRBC: 0 % (ref 0.0–0.2)

## 2021-08-12 LAB — MAGNESIUM: Magnesium: 2 mg/dL (ref 1.7–2.4)

## 2021-08-12 MED ORDER — HEPARIN SOD (PORK) LOCK FLUSH 100 UNIT/ML IV SOLN
INTRAVENOUS | Status: AC
  Filled 2021-08-12: qty 5

## 2021-08-12 MED ORDER — FILGRASTIM-SNDZ 480 MCG/0.8ML IJ SOSY
480.0000 ug | PREFILLED_SYRINGE | Freq: Once | INTRAMUSCULAR | Status: AC
  Administered 2021-08-12: 480 ug via SUBCUTANEOUS
  Filled 2021-08-12: qty 0.8

## 2021-08-12 MED ORDER — HEPARIN SOD (PORK) LOCK FLUSH 100 UNIT/ML IV SOLN
500.0000 [IU] | Freq: Once | INTRAVENOUS | Status: AC
  Administered 2021-08-12: 500 [IU] via INTRAVENOUS
  Filled 2021-08-12: qty 5

## 2021-08-12 NOTE — Progress Notes (Signed)
Labs reviewed with MD and treatment team. Per MD to hold chemotherapy infusion today, schedule patient to next week to receive cisplatin, and administer zarxio today. Pt and treatment team updated and agrees to plan. Per Anderson Malta, scheduler, she will call and update pt on new appointments. Pt stable at discharge. VSS.   Dawn Camacho [employer]

## 2021-08-13 ENCOUNTER — Other Ambulatory Visit: Payer: Self-pay

## 2021-08-13 ENCOUNTER — Ambulatory Visit
Admission: RE | Admit: 2021-08-13 | Discharge: 2021-08-13 | Disposition: A | Discharge status: Home / Self Care | Payer: BC Managed Care – PPO | Source: Ambulatory Visit | Attending: Radiation Oncology | Admitting: Radiation Oncology

## 2021-08-13 ENCOUNTER — Encounter: Payer: Self-pay | Admitting: Oncology

## 2021-08-13 ENCOUNTER — Inpatient Hospital Stay: Payer: BC Managed Care – PPO

## 2021-08-13 ENCOUNTER — Telehealth: Payer: Self-pay | Admitting: Oncology

## 2021-08-13 DIAGNOSIS — C531 Malignant neoplasm of exocervix: Secondary | ICD-10-CM | POA: Diagnosis not present

## 2021-08-13 DIAGNOSIS — Z510 Encounter for antineoplastic radiation therapy: Secondary | ICD-10-CM | POA: Diagnosis not present

## 2021-08-13 DIAGNOSIS — C539 Malignant neoplasm of cervix uteri, unspecified: Secondary | ICD-10-CM

## 2021-08-13 LAB — RAD ONC ARIA SESSION SUMMARY
Course Elapsed Days: 35
Plan Fractions Treated to Date: 22
Plan Prescribed Dose Per Fraction: 2 Gy
Plan Total Fractions Prescribed: 27
Plan Total Prescribed Dose: 54 Gy
Reference Point Dosage Given to Date: 50 Gy
Reference Point Session Dosage Given: 2 Gy
Session Number: 25

## 2021-08-13 MED ORDER — FILGRASTIM-SNDZ 480 MCG/0.8ML IJ SOSY
480.0000 ug | PREFILLED_SYRINGE | Freq: Once | INTRAMUSCULAR | Status: AC
  Administered 2021-08-13: 480 ug via SUBCUTANEOUS
  Filled 2021-08-13: qty 0.8

## 2021-08-13 NOTE — Telephone Encounter (Signed)
Dawn Camacho can you clarify?

## 2021-08-13 NOTE — Telephone Encounter (Signed)
Patient called to reschedule 6/29 appointment for either Monday or Tuesday. She stated she has another appointment that she can not miss and would like to change this one. Either Monday or Tuesday before or the next week.

## 2021-08-14 ENCOUNTER — Telehealth: Payer: Self-pay | Admitting: *Deleted

## 2021-08-14 ENCOUNTER — Inpatient Hospital Stay: Payer: BC Managed Care – PPO

## 2021-08-14 ENCOUNTER — Ambulatory Visit
Admission: RE | Admit: 2021-08-14 | Discharge: 2021-08-14 | Disposition: A | Discharge status: Home / Self Care | Payer: BC Managed Care – PPO | Source: Ambulatory Visit | Attending: Radiation Oncology | Admitting: Radiation Oncology

## 2021-08-14 ENCOUNTER — Other Ambulatory Visit: Payer: Self-pay

## 2021-08-14 DIAGNOSIS — C531 Malignant neoplasm of exocervix: Secondary | ICD-10-CM | POA: Diagnosis not present

## 2021-08-14 DIAGNOSIS — Z510 Encounter for antineoplastic radiation therapy: Secondary | ICD-10-CM | POA: Diagnosis not present

## 2021-08-14 DIAGNOSIS — C539 Malignant neoplasm of cervix uteri, unspecified: Secondary | ICD-10-CM

## 2021-08-14 LAB — CBC WITH DIFFERENTIAL/PLATELET
Abs Immature Granulocytes: 0.23 10*3/uL — ABNORMAL HIGH (ref 0.00–0.07)
Basophils Absolute: 0.1 10*3/uL (ref 0.0–0.1)
Basophils Relative: 2 %
Eosinophils Absolute: 0 10*3/uL (ref 0.0–0.5)
Eosinophils Relative: 1 %
HCT: 29.2 % — ABNORMAL LOW (ref 36.0–46.0)
Hemoglobin: 10.3 g/dL — ABNORMAL LOW (ref 12.0–15.0)
Immature Granulocytes: 8 %
Lymphocytes Relative: 18 %
Lymphs Abs: 0.5 10*3/uL — ABNORMAL LOW (ref 0.7–4.0)
MCH: 32 pg (ref 26.0–34.0)
MCHC: 35.3 g/dL (ref 30.0–36.0)
MCV: 90.7 fL (ref 80.0–100.0)
Monocytes Absolute: 0.3 10*3/uL (ref 0.1–1.0)
Monocytes Relative: 9 %
Neutro Abs: 1.8 10*3/uL (ref 1.7–7.7)
Neutrophils Relative %: 62 %
Platelets: 69 10*3/uL — ABNORMAL LOW (ref 150–400)
RBC: 3.22 MIL/uL — ABNORMAL LOW (ref 3.87–5.11)
RDW: 13.8 % (ref 11.5–15.5)
Smear Review: NORMAL
WBC: 2.9 10*3/uL — ABNORMAL LOW (ref 4.0–10.5)
nRBC: 0 % (ref 0.0–0.2)

## 2021-08-14 LAB — RAD ONC ARIA SESSION SUMMARY
Course Elapsed Days: 36
Plan Fractions Treated to Date: 23
Plan Prescribed Dose Per Fraction: 2 Gy
Plan Total Fractions Prescribed: 27
Plan Total Prescribed Dose: 54 Gy
Reference Point Dosage Given to Date: 52 Gy
Reference Point Session Dosage Given: 2 Gy
Session Number: 26

## 2021-08-14 MED ORDER — FILGRASTIM-SNDZ 480 MCG/0.8ML IJ SOSY: 480.0000 ug | PREFILLED_SYRINGE | Freq: Once | INTRAMUSCULAR | Status: DC

## 2021-08-14 NOTE — Telephone Encounter (Signed)
Pt has sent a message about why the appt for chemo was cancelled for next week. I called her that Dr. Smith Robert called me today and said thet because you are only getting 3 days of radiation and the med is to work together that you would not benefit for the infusion because it would be after you radiation was completed. She is happy about this.

## 2021-08-15 ENCOUNTER — Ambulatory Visit: Admission: RE | Admit: 2021-08-15 | Payer: BC Managed Care – PPO | Source: Ambulatory Visit

## 2021-08-15 ENCOUNTER — Ambulatory Visit: Payer: BC Managed Care – PPO

## 2021-08-18 ENCOUNTER — Ambulatory Visit: Payer: BC Managed Care – PPO

## 2021-08-19 ENCOUNTER — Other Ambulatory Visit: Payer: Self-pay | Admitting: *Deleted

## 2021-08-19 ENCOUNTER — Ambulatory Visit: Payer: BC Managed Care – PPO

## 2021-08-19 DIAGNOSIS — C539 Malignant neoplasm of cervix uteri, unspecified: Secondary | ICD-10-CM

## 2021-08-20 ENCOUNTER — Ambulatory Visit: Payer: BC Managed Care – PPO

## 2021-08-20 ENCOUNTER — Other Ambulatory Visit: Payer: BC Managed Care – PPO

## 2021-08-20 DIAGNOSIS — C539 Malignant neoplasm of cervix uteri, unspecified: Secondary | ICD-10-CM | POA: Diagnosis not present

## 2021-08-21 ENCOUNTER — Ambulatory Visit: Payer: BC Managed Care – PPO

## 2021-08-21 ENCOUNTER — Inpatient Hospital Stay: Payer: BC Managed Care – PPO

## 2021-08-21 ENCOUNTER — Other Ambulatory Visit: Payer: BC Managed Care – PPO

## 2021-08-21 DIAGNOSIS — C531 Malignant neoplasm of exocervix: Secondary | ICD-10-CM | POA: Diagnosis not present

## 2021-08-21 DIAGNOSIS — Z510 Encounter for antineoplastic radiation therapy: Secondary | ICD-10-CM | POA: Diagnosis not present

## 2021-08-21 DIAGNOSIS — C539 Malignant neoplasm of cervix uteri, unspecified: Secondary | ICD-10-CM

## 2021-08-21 LAB — CBC
HCT: 26.8 % — ABNORMAL LOW (ref 36.0–46.0)
Hemoglobin: 9.3 g/dL — ABNORMAL LOW (ref 12.0–15.0)
MCH: 32.4 pg (ref 26.0–34.0)
MCHC: 34.7 g/dL (ref 30.0–36.0)
MCV: 93.4 fL (ref 80.0–100.0)
Platelets: 161 10*3/uL (ref 150–400)
RBC: 2.87 MIL/uL — ABNORMAL LOW (ref 3.87–5.11)
RDW: 16.1 % — ABNORMAL HIGH (ref 11.5–15.5)
WBC: 2.9 10*3/uL — ABNORMAL LOW (ref 4.0–10.5)
nRBC: 1 % — ABNORMAL HIGH (ref 0.0–0.2)

## 2021-08-22 ENCOUNTER — Ambulatory Visit: Payer: BC Managed Care – PPO

## 2021-08-25 ENCOUNTER — Ambulatory Visit: Payer: BC Managed Care – PPO

## 2021-08-25 ENCOUNTER — Other Ambulatory Visit: Payer: Self-pay

## 2021-08-25 ENCOUNTER — Ambulatory Visit
Admission: RE | Admit: 2021-08-25 | Discharge: 2021-08-25 | Disposition: A | Discharge status: Home / Self Care | Payer: BC Managed Care – PPO | Source: Ambulatory Visit | Attending: Radiation Oncology | Admitting: Radiation Oncology

## 2021-08-25 DIAGNOSIS — C531 Malignant neoplasm of exocervix: Secondary | ICD-10-CM | POA: Insufficient documentation

## 2021-08-25 DIAGNOSIS — Z510 Encounter for antineoplastic radiation therapy: Secondary | ICD-10-CM | POA: Insufficient documentation

## 2021-08-25 LAB — RAD ONC ARIA SESSION SUMMARY
Course Elapsed Days: 47
Plan Fractions Treated to Date: 24
Plan Prescribed Dose Per Fraction: 2 Gy
Plan Total Fractions Prescribed: 27
Plan Total Prescribed Dose: 54 Gy
Reference Point Dosage Given to Date: 54 Gy
Reference Point Session Dosage Given: 2 Gy
Session Number: 27

## 2021-08-27 ENCOUNTER — Ambulatory Visit
Admission: RE | Admit: 2021-08-27 | Discharge: 2021-08-27 | Disposition: A | Discharge status: Home / Self Care | Payer: BC Managed Care – PPO | Source: Ambulatory Visit | Attending: Radiation Oncology | Admitting: Radiation Oncology

## 2021-08-27 ENCOUNTER — Other Ambulatory Visit: Payer: Self-pay

## 2021-08-27 ENCOUNTER — Ambulatory Visit: Payer: BC Managed Care – PPO

## 2021-08-27 DIAGNOSIS — C531 Malignant neoplasm of exocervix: Secondary | ICD-10-CM | POA: Diagnosis not present

## 2021-08-27 DIAGNOSIS — Z510 Encounter for antineoplastic radiation therapy: Secondary | ICD-10-CM | POA: Diagnosis not present

## 2021-08-27 LAB — RAD ONC ARIA SESSION SUMMARY
Course Elapsed Days: 49
Plan Fractions Treated to Date: 25
Plan Prescribed Dose Per Fraction: 2 Gy
Plan Total Fractions Prescribed: 27
Plan Total Prescribed Dose: 54 Gy
Reference Point Dosage Given to Date: 56 Gy
Reference Point Session Dosage Given: 2 Gy
Session Number: 28

## 2021-08-28 ENCOUNTER — Other Ambulatory Visit: Payer: Self-pay

## 2021-08-28 ENCOUNTER — Ambulatory Visit
Admission: RE | Admit: 2021-08-28 | Discharge: 2021-08-28 | Disposition: A | Discharge status: Home / Self Care | Payer: BC Managed Care – PPO | Source: Ambulatory Visit | Attending: Radiation Oncology | Admitting: Radiation Oncology

## 2021-08-28 DIAGNOSIS — C539 Malignant neoplasm of cervix uteri, unspecified: Secondary | ICD-10-CM | POA: Diagnosis not present

## 2021-08-28 DIAGNOSIS — Z510 Encounter for antineoplastic radiation therapy: Secondary | ICD-10-CM | POA: Diagnosis not present

## 2021-08-28 DIAGNOSIS — C531 Malignant neoplasm of exocervix: Secondary | ICD-10-CM | POA: Diagnosis not present

## 2021-08-28 DIAGNOSIS — C530 Malignant neoplasm of endocervix: Secondary | ICD-10-CM | POA: Diagnosis not present

## 2021-08-28 DIAGNOSIS — Z01818 Encounter for other preprocedural examination: Secondary | ICD-10-CM | POA: Diagnosis not present

## 2021-08-28 LAB — RAD ONC ARIA SESSION SUMMARY
Course Elapsed Days: 50
Plan Fractions Treated to Date: 26
Plan Prescribed Dose Per Fraction: 2 Gy
Plan Total Fractions Prescribed: 27
Plan Total Prescribed Dose: 54 Gy
Reference Point Dosage Given to Date: 58 Gy
Reference Point Session Dosage Given: 2 Gy
Session Number: 29

## 2021-08-29 ENCOUNTER — Other Ambulatory Visit: Payer: Self-pay

## 2021-08-29 ENCOUNTER — Ambulatory Visit
Admission: RE | Admit: 2021-08-29 | Discharge: 2021-08-29 | Disposition: A | Discharge status: Home / Self Care | Payer: BC Managed Care – PPO | Source: Ambulatory Visit | Attending: Radiation Oncology | Admitting: Radiation Oncology

## 2021-08-29 DIAGNOSIS — C531 Malignant neoplasm of exocervix: Secondary | ICD-10-CM | POA: Diagnosis not present

## 2021-08-29 DIAGNOSIS — Z510 Encounter for antineoplastic radiation therapy: Secondary | ICD-10-CM | POA: Diagnosis not present

## 2021-08-29 LAB — RAD ONC ARIA SESSION SUMMARY
Course Elapsed Days: 51
Plan Fractions Treated to Date: 27
Plan Prescribed Dose Per Fraction: 2 Gy
Plan Total Fractions Prescribed: 27
Plan Total Prescribed Dose: 54 Gy
Reference Point Dosage Given to Date: 60 Gy
Reference Point Session Dosage Given: 2 Gy
Session Number: 30

## 2021-09-01 DIAGNOSIS — C530 Malignant neoplasm of endocervix: Secondary | ICD-10-CM | POA: Diagnosis not present

## 2021-09-01 DIAGNOSIS — C539 Malignant neoplasm of cervix uteri, unspecified: Secondary | ICD-10-CM | POA: Diagnosis not present

## 2021-09-05 DIAGNOSIS — Z79899 Other long term (current) drug therapy: Secondary | ICD-10-CM | POA: Diagnosis not present

## 2021-09-05 DIAGNOSIS — C539 Malignant neoplasm of cervix uteri, unspecified: Secondary | ICD-10-CM | POA: Diagnosis not present

## 2021-09-05 DIAGNOSIS — Z510 Encounter for antineoplastic radiation therapy: Secondary | ICD-10-CM | POA: Diagnosis not present

## 2021-09-05 DIAGNOSIS — F419 Anxiety disorder, unspecified: Secondary | ICD-10-CM | POA: Diagnosis not present

## 2021-09-05 DIAGNOSIS — M549 Dorsalgia, unspecified: Secondary | ICD-10-CM | POA: Diagnosis not present

## 2021-09-08 ENCOUNTER — Ambulatory Visit: Payer: BC Managed Care – PPO | Admitting: Oncology

## 2021-09-08 ENCOUNTER — Other Ambulatory Visit: Payer: BC Managed Care – PPO

## 2021-09-08 DIAGNOSIS — C530 Malignant neoplasm of endocervix: Secondary | ICD-10-CM | POA: Diagnosis not present

## 2021-09-08 DIAGNOSIS — F419 Anxiety disorder, unspecified: Secondary | ICD-10-CM | POA: Diagnosis not present

## 2021-09-08 DIAGNOSIS — C539 Malignant neoplasm of cervix uteri, unspecified: Secondary | ICD-10-CM | POA: Diagnosis not present

## 2021-09-08 DIAGNOSIS — M7989 Other specified soft tissue disorders: Secondary | ICD-10-CM | POA: Diagnosis not present

## 2021-09-08 DIAGNOSIS — M549 Dorsalgia, unspecified: Secondary | ICD-10-CM | POA: Diagnosis not present

## 2021-09-15 ENCOUNTER — Other Ambulatory Visit: Payer: Self-pay

## 2021-09-15 DIAGNOSIS — Z79899 Other long term (current) drug therapy: Secondary | ICD-10-CM | POA: Diagnosis not present

## 2021-09-15 DIAGNOSIS — C539 Malignant neoplasm of cervix uteri, unspecified: Secondary | ICD-10-CM | POA: Diagnosis not present

## 2021-09-15 DIAGNOSIS — C530 Malignant neoplasm of endocervix: Secondary | ICD-10-CM | POA: Diagnosis not present

## 2021-09-16 ENCOUNTER — Ambulatory Visit: Payer: BC Managed Care – PPO | Admitting: Radiation Oncology

## 2021-09-17 ENCOUNTER — Ambulatory Visit: Payer: BC Managed Care – PPO | Admitting: Radiation Oncology

## 2021-09-23 ENCOUNTER — Inpatient Hospital Stay (HOSPITAL_BASED_OUTPATIENT_CLINIC_OR_DEPARTMENT_OTHER): Payer: BC Managed Care – PPO | Admitting: Oncology

## 2021-09-23 ENCOUNTER — Inpatient Hospital Stay: Payer: BC Managed Care – PPO | Attending: Oncology

## 2021-09-23 ENCOUNTER — Encounter: Payer: Self-pay | Admitting: Oncology

## 2021-09-23 ENCOUNTER — Other Ambulatory Visit: Payer: Self-pay

## 2021-09-23 VITALS — BP 124/78 | HR 87 | Temp 98.3°F | Resp 18 | Wt 213.0 lb

## 2021-09-23 DIAGNOSIS — C539 Malignant neoplasm of cervix uteri, unspecified: Secondary | ICD-10-CM | POA: Insufficient documentation

## 2021-09-23 DIAGNOSIS — Z8249 Family history of ischemic heart disease and other diseases of the circulatory system: Secondary | ICD-10-CM | POA: Insufficient documentation

## 2021-09-23 DIAGNOSIS — Z793 Long term (current) use of hormonal contraceptives: Secondary | ICD-10-CM | POA: Diagnosis not present

## 2021-09-23 DIAGNOSIS — Z79899 Other long term (current) drug therapy: Secondary | ICD-10-CM | POA: Diagnosis not present

## 2021-09-23 DIAGNOSIS — Z801 Family history of malignant neoplasm of trachea, bronchus and lung: Secondary | ICD-10-CM | POA: Insufficient documentation

## 2021-09-23 DIAGNOSIS — R87820 Cervical low risk human papillomavirus (HPV) DNA test positive: Secondary | ICD-10-CM | POA: Diagnosis not present

## 2021-09-23 DIAGNOSIS — R87810 Cervical high risk human papillomavirus (HPV) DNA test positive: Secondary | ICD-10-CM | POA: Diagnosis not present

## 2021-09-23 DIAGNOSIS — R232 Flushing: Secondary | ICD-10-CM | POA: Insufficient documentation

## 2021-09-23 DIAGNOSIS — Z803 Family history of malignant neoplasm of breast: Secondary | ICD-10-CM | POA: Insufficient documentation

## 2021-09-23 DIAGNOSIS — C778 Secondary and unspecified malignant neoplasm of lymph nodes of multiple regions: Secondary | ICD-10-CM | POA: Diagnosis not present

## 2021-09-23 DIAGNOSIS — N200 Calculus of kidney: Secondary | ICD-10-CM | POA: Diagnosis not present

## 2021-09-23 LAB — CBC WITH DIFFERENTIAL/PLATELET
Abs Immature Granulocytes: 0.01 10*3/uL (ref 0.00–0.07)
Basophils Absolute: 0 10*3/uL (ref 0.0–0.1)
Basophils Relative: 1 %
Eosinophils Absolute: 0.2 10*3/uL (ref 0.0–0.5)
Eosinophils Relative: 7 %
HCT: 34.6 % — ABNORMAL LOW (ref 36.0–46.0)
Hemoglobin: 12.4 g/dL (ref 12.0–15.0)
Immature Granulocytes: 0 %
Lymphocytes Relative: 20 %
Lymphs Abs: 0.7 10*3/uL (ref 0.7–4.0)
MCH: 35 pg — ABNORMAL HIGH (ref 26.0–34.0)
MCHC: 35.8 g/dL (ref 30.0–36.0)
MCV: 97.7 fL (ref 80.0–100.0)
Monocytes Absolute: 0.4 10*3/uL (ref 0.1–1.0)
Monocytes Relative: 12 %
Neutro Abs: 2.1 10*3/uL (ref 1.7–7.7)
Neutrophils Relative %: 60 %
Platelets: 243 10*3/uL (ref 150–400)
RBC: 3.54 MIL/uL — ABNORMAL LOW (ref 3.87–5.11)
RDW: 14.6 % (ref 11.5–15.5)
WBC: 3.5 10*3/uL — ABNORMAL LOW (ref 4.0–10.5)
nRBC: 0 % (ref 0.0–0.2)

## 2021-09-23 LAB — BASIC METABOLIC PANEL
Anion gap: 6 (ref 5–15)
BUN: 10 mg/dL (ref 6–20)
CO2: 26 mmol/L (ref 22–32)
Calcium: 9.3 mg/dL (ref 8.9–10.3)
Chloride: 107 mmol/L (ref 98–111)
Creatinine, Ser: 0.83 mg/dL (ref 0.44–1.00)
GFR, Estimated: >60 mL/min (ref >60)
Glucose, Bld: 104 mg/dL — ABNORMAL HIGH (ref 70–99)
Potassium: 3.9 mmol/L (ref 3.5–5.1)
Sodium: 139 mmol/L (ref 135–145)

## 2021-09-23 LAB — MAGNESIUM: Magnesium: 2 mg/dL (ref 1.7–2.4)

## 2021-09-23 NOTE — Progress Notes (Signed)
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  She was seen by GYN and had a cervical exam which showed a large pink friable vascularized irregular growth in the cervix extending from 11:00 to 5 o'clock position.  Pap showed ASCUS and high risk HPV.  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  She is married and uses NuvaRing for contraception.  She does not have any children yet.  She has had 2 Gardasil vaccinations in the past but did not receive a booster patient was seen by GYN oncology Dr. Fransisca Connors and plan was to obtain a PET CT scan to decide if she would be a candidate for surgery.  PET scan showed hypermetabolic activity of the cervix compatible with primary malignancy.  Hypermetabolic subcentimeter bilateral external iliac lymph nodes concerning for metastatic disease.  Hypermetabolic left inguinal lymph node concerning for additional site of metastatic disease.  Bilateral nonobstructing renal stones.   Plan is to proceed with definitive concurrent chemoradiation with weekly cisplatin which she stopped on 07/07/2021    Interval history-patient reports some ongoing hot flashes which are overall self-limited.  She is sexually active and does not report any significant postcoital pain or bleeding.  Denies any urinary complaints.  Denies any pelvic pain.  She tolerated vaginal brachytherapy well.  ECOG  PS- 0 Pain scale- 0   Review of systems- Review of Systems  Constitutional:  Negative for chills, fever, malaise/fatigue and weight loss.  HENT:  Negative for congestion, ear discharge and nosebleeds.   Eyes:  Negative for blurred vision.  Respiratory:  Negative for cough, hemoptysis, sputum production, shortness of breath and wheezing.   Cardiovascular:  Negative for chest pain, palpitations, orthopnea and claudication.  Gastrointestinal:  Negative for abdominal pain, blood in stool, constipation, diarrhea, heartburn, melena, nausea and vomiting.  Genitourinary:  Negative for dysuria, flank pain, frequency, hematuria and urgency.  Musculoskeletal:  Negative for back pain, joint pain and myalgias.  Skin:  Negative for rash.  Neurological:  Negative for dizziness, tingling, focal weakness, seizures, weakness and headaches.  Endo/Heme/Allergies:  Does not bruise/bleed easily.       Hot flashes  Psychiatric/Behavioral:  Negative for depression and suicidal ideas. The patient does not have insomnia.       No Known Allergies   Past Medical History:  Diagnosis Date   Allergic rhinitis    Allergy    Cervical cancer (HCC)      Past Surgical History:  Procedure Laterality Date   IR IMAGING GUIDED PORT INSERTION  06/30/2021   TONSILECTOMY, ADENOIDECTOMY, BILATERAL MYRINGOTOMY AND TUBES Bilateral    TONSILLECTOMY     WISDOM TOOTH EXTRACTION      Social History   Socioeconomic History   Marital status: Married    Spouse name: Not on file   Number of children: Not on file   Years of education: Not on file   Highest education level: Not on file  Occupational History   Not  on file  Tobacco Use   Smoking status: Never   Smokeless tobacco: Never  Vaping Use   Vaping Use: Never used  Substance and Sexual Activity   Alcohol use: Yes    Alcohol/week: 1.0 standard drink of alcohol    Types: 1 Glasses of wine per week    Comment: occasional--every couple of weeks.   Drug use: No    Sexual activity: Yes  Other Topics Concern   Not on file  Social History Narrative   Not on file   Social Determinants of Health   Financial Resource Strain: Not on file  Food Insecurity: Not on file  Transportation Needs: Not on file  Physical Activity: Not on file  Stress: Not on file  Social Connections: Not on file  Intimate Partner Violence: Not on file    Family History  Problem Relation Age of Onset   Heart disease Father    Breast cancer Maternal Grandmother    Lung cancer Paternal Grandmother    Heart attack Paternal Grandfather      Current Outpatient Medications:    acetaminophen (TYLENOL) 500 MG tablet, Take 500 mg by mouth every 6 (six) hours as needed. (Patient not taking: Reported on 08/04/2021), Disp: , Rfl:    aspirin-acetaminophen-caffeine (EXCEDRIN MIGRAINE) 250-250-65 MG tablet, Take by mouth every 6 (six) hours as needed for headache. (Patient not taking: Reported on 07/22/2021), Disp: , Rfl:    dexamethasone (DECADRON) 4 MG tablet, Take 2 tablets (8 mg total) by mouth daily. Take daily x 3 days starting the day after cisplatin chemotherapy. Take with food. (Patient not taking: Reported on 07/07/2021), Disp: 30 tablet, Rfl: 1   escitalopram (LEXAPRO) 10 MG tablet, Take 10 mg by mouth daily. (Patient not taking: Reported on 06/24/2021), Disp: , Rfl:    etonogestrel-ethinyl estradiol (NUVARING) 0.12-0.015 MG/24HR vaginal ring, Place vaginally. (Patient not taking: Reported on 07/07/2021), Disp: , Rfl:    lidocaine-prilocaine (EMLA) cream, Apply to affected area once (Patient not taking: Reported on 09/23/2021), Disp: 30 g, Rfl: 3   LORazepam (ATIVAN) 0.5 MG tablet, Take 1 tablet (0.5 mg total) by mouth every 6 (six) hours as needed (Nausea or vomiting). (Patient not taking: Reported on 07/07/2021), Disp: 30 tablet, Rfl: 0   ondansetron (ZOFRAN) 8 MG tablet, Take 1 tablet (8 mg total) by mouth 2 (two) times daily as needed. Start on the third day after cisplatin  chemotherapy. (Patient not taking: Reported on 07/07/2021), Disp: 30 tablet, Rfl: 1   prochlorperazine (COMPAZINE) 10 MG tablet, Take 1 tablet (10 mg total) by mouth every 6 (six) hours as needed (Nausea or vomiting). (Patient not taking: Reported on 07/07/2021), Disp: 30 tablet, Rfl: 1   Simethicone (GAS-X MAXIMUM STRENGTH PO), Take by mouth. (Patient not taking: Reported on 08/04/2021), Disp: , Rfl:  No current facility-administered medications for this visit.  Facility-Administered Medications Ordered in Other Visits:    filgrastim-sndz (ZARXIO) injection 480 mcg, 480 mcg, Subcutaneous, Once, Berchuck, Andrew, MD   sodium chloride flush (NS) 0.9 % injection 10 mL, 10 mL, Intravenous, PRN, Sindy Guadeloupe, MD, 10 mL at 07/07/21 0841  Physical exam:  Vitals:   09/23/21 0958  BP: 124/78  Pulse: 87  Resp: 18  Temp: 98.3 F (36.8 C)  SpO2: 100%  Weight: 213 lb (96.6 kg)   Physical Exam Constitutional:      General: She is not in acute distress. Cardiovascular:     Rate and Rhythm: Normal rate and regular rhythm.  Heart sounds: Normal heart sounds.  Pulmonary:     Effort: Pulmonary effort is normal.     Breath sounds: Normal breath sounds.  Abdominal:     General: Bowel sounds are normal.     Palpations: Abdomen is soft.  Skin:    General: Skin is warm and dry.  Neurological:     Mental Status: She is alert and oriented to person, place, and time.         Latest Ref Rng & Units 09/23/2021    9:41 AM  CMP  Glucose 70 - 99 mg/dL 104   BUN 6 - 20 mg/dL 10   Creatinine 0.44 - 1.00 mg/dL 0.83   Sodium 135 - 145 mmol/L 139   Potassium 3.5 - 5.1 mmol/L 3.9   Chloride 98 - 111 mmol/L 107   CO2 22 - 32 mmol/L 26   Calcium 8.9 - 10.3 mg/dL 9.3       Latest Ref Rng & Units 09/23/2021    9:41 AM  CBC  WBC 4.0 - 10.5 K/uL 3.5   Hemoglobin 12.0 - 15.0 g/dL 12.4   Hematocrit 36.0 - 46.0 % 34.6   Platelets 150 - 400 K/uL 243      Assessment and plan- Patient is a 31 y.o. female  stage IIIC HPV positive cervical cancer.  She is s/p concurrent chemoradiation with weekly cisplatin followed by vaginal brachytherapy and here for routine follow-up  Patient completed vaginal brachytherapy on 08/29/2021.  She is scheduled for repeat PET CT scan in early September and I will see her thereafter.  She will also reestablish her follow-up with GYN oncology.  I will have NP Beckey Rutter reach out to her regarding hormone replacement therapy given her hot flashes.  She does not need to use any NuvaRing for contraception at this time as the likelihood of her becoming pregnant after chemoradiation is low  In terms of her cytopenias leukopenia has improved.  Platelet counts are normalizing hemoglobin is better as well.  I will see her back to discuss PET CT scan results with repeat labs CBC with differential and CMP.  We will consider port removal after PET scan results are back   Visit Diagnosis 1. Cervical cancer, FIGO stage III ([place])      Dr. Randa Evens, MD, MPH [facility] at [facility] [phone] 09/23/2021 4:37 PM

## 2021-09-24 ENCOUNTER — Other Ambulatory Visit: Payer: Self-pay

## 2021-10-01 ENCOUNTER — Encounter: Payer: Self-pay | Admitting: Radiation Oncology

## 2021-10-01 ENCOUNTER — Ambulatory Visit
Admission: RE | Admit: 2021-10-01 | Discharge: 2021-10-01 | Disposition: A | Discharge status: Home / Self Care | Payer: BC Managed Care – PPO | Source: Ambulatory Visit | Attending: Radiation Oncology | Admitting: Radiation Oncology

## 2021-10-01 VITALS — BP 132/87 | HR 88 | Temp 97.5°F | Resp 16 | Wt 212.6 lb

## 2021-10-01 DIAGNOSIS — C531 Malignant neoplasm of exocervix: Secondary | ICD-10-CM | POA: Insufficient documentation

## 2021-10-01 DIAGNOSIS — Z923 Personal history of irradiation: Secondary | ICD-10-CM | POA: Insufficient documentation

## 2021-10-01 DIAGNOSIS — C539 Malignant neoplasm of cervix uteri, unspecified: Secondary | ICD-10-CM

## 2021-10-01 NOTE — Progress Notes (Signed)
Radiation Oncology Follow up Note  Name: Dawn Camacho   Date:   10/01/2021 MRN:  [id number] DOB: February 08, 1991    This 31 y.o. 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  REFERRING PROVIDER: Verlon Au, NP  HPI: Patient is a 31 year old female now at 1 month having completed both external beam treatment to her pelvis as well as brachytherapy for stage IIa 2 squamous cell carcinoma of the cervix.  Her FIGO stage was 3.  Seen today in routine follow-up she is doing well.  She specifically denies any increased lower urinary tract symptoms diarrhea or or fatigue.  She is under no treatment at this time.  She has a PET scan set up for the next several months.  COMPLICATIONS OF TREATMENT: none  FOLLOW UP COMPLIANCE: keeps appointments   PHYSICAL EXAM:  BP 132/87 (BP Location: Right Arm, Patient Position: Sitting, Cuff Size: Normal)   Pulse 88   Temp (!) 97.5 F (36.4 C) (Tympanic)   Resp 16   Wt 212 lb 9.6 oz (96.4 kg)   BMI 34.31 kg/m  Well-developed well-nourished patient in NAD. HEENT reveals PERLA, EOMI, discs not visualized.  Oral cavity is clear. No oral mucosal lesions are identified. Neck is clear without evidence of cervical or supraclavicular adenopathy. Lungs are clear to A&P. Cardiac examination is essentially unremarkable with regular rate and rhythm without murmur rub or thrill. Abdomen is benign with no organomegaly or masses noted. Motor sensory and DTR levels are equal and symmetric in the upper and lower extremities. Cranial nerves II through XII are grossly intact. Proprioception is intact. No peripheral adenopathy or edema is identified. No motor or sensory levels are noted. Crude visual fields are within normal range.  RADIOLOGY RESULTS: No current films for review  PLAN: Present time she is  completed radiation therapy with very low side effect profile.  And pleased with her overall progress.  She has a PET scan already scheduled and to follow-up with medical oncology.  Will get her into see Dr. Fransisca Connors after the PET scan CT scan is performed.  I have asked to see her back in 3 months for follow-up.  Patient knows to call with any concerns.  I would like to take this opportunity to thank you for allowing me to participate in the care of your patient.Dawn Filbert, MD

## 2021-10-02 ENCOUNTER — Other Ambulatory Visit: Payer: Self-pay

## 2021-10-02 DIAGNOSIS — F4323 Adjustment disorder with mixed anxiety and depressed mood: Secondary | ICD-10-CM | POA: Diagnosis not present

## 2021-10-06 DIAGNOSIS — F4323 Adjustment disorder with mixed anxiety and depressed mood: Secondary | ICD-10-CM | POA: Diagnosis not present

## 2021-10-06 DIAGNOSIS — L03312 Cellulitis of back [any part except buttock]: Secondary | ICD-10-CM | POA: Diagnosis not present

## 2021-10-08 ENCOUNTER — Telehealth: Payer: Self-pay | Admitting: *Deleted

## 2021-10-08 ENCOUNTER — Encounter: Payer: Self-pay | Admitting: *Deleted

## 2021-10-08 ENCOUNTER — Other Ambulatory Visit: Payer: Self-pay | Admitting: *Deleted

## 2021-10-08 ENCOUNTER — Other Ambulatory Visit: Payer: Self-pay | Admitting: Nurse Practitioner

## 2021-10-08 DIAGNOSIS — E8940 Asymptomatic postprocedural ovarian failure: Secondary | ICD-10-CM

## 2021-10-08 MED ORDER — LEVONORGESTREL-ETHINYL ESTRAD 90-20 MCG PO TABS: 1.0000 | ORAL_TABLET | Freq: Every day | ORAL | 11 refills | Status: DC

## 2021-10-08 NOTE — Telephone Encounter (Signed)
I called the pt to go over the pill she is getting and things to watch out for. I went over the info on the phone and also sent her the my chart message about it. She said that she no longer uses [facility] in [place] and to please send it to [facility] in [place]. I called [facility] and spoke to the staff and cancelled the rx. I then sent it to [facility] in [place].

## 2021-10-08 NOTE — Progress Notes (Signed)
Prescription for hormone replacement pills sent ot patient's pharmacy. She will follow up with Dr. Janese Banks as scheduled. If symptoms not controlled with this regimen, rotate to estradiol with prometrium add-back.

## 2021-10-21 DIAGNOSIS — F4323 Adjustment disorder with mixed anxiety and depressed mood: Secondary | ICD-10-CM | POA: Diagnosis not present

## 2021-10-22 ENCOUNTER — Telehealth: Payer: Self-pay | Admitting: *Deleted

## 2021-10-22 NOTE — Telephone Encounter (Signed)
I received a message that the my chart message to the patient was never reviewed. I called the pharmacy and check with them to see if pt . Got the medicine and staff said that she picked it up on 8/16 and the med was Lybrel.

## 2021-10-30 DIAGNOSIS — F4323 Adjustment disorder with mixed anxiety and depressed mood: Secondary | ICD-10-CM | POA: Diagnosis not present

## 2021-11-01 ENCOUNTER — Encounter: Payer: Self-pay | Admitting: Oncology

## 2021-11-04 ENCOUNTER — Inpatient Hospital Stay: Payer: BC Managed Care – PPO | Attending: Oncology

## 2021-11-04 DIAGNOSIS — Z452 Encounter for adjustment and management of vascular access device: Secondary | ICD-10-CM | POA: Insufficient documentation

## 2021-11-04 DIAGNOSIS — C539 Malignant neoplasm of cervix uteri, unspecified: Secondary | ICD-10-CM | POA: Insufficient documentation

## 2021-11-04 DIAGNOSIS — Z95828 Presence of other vascular implants and grafts: Secondary | ICD-10-CM

## 2021-11-04 MED ORDER — HEPARIN SOD (PORK) LOCK FLUSH 100 UNIT/ML IV SOLN
500.0000 [IU] | Freq: Once | INTRAVENOUS | Status: AC
  Administered 2021-11-04: 500 [IU] via INTRAVENOUS
  Filled 2021-11-04: qty 5

## 2021-11-04 MED ORDER — SODIUM CHLORIDE 0.9% FLUSH
10.0000 mL | Freq: Once | INTRAVENOUS | Status: AC
  Administered 2021-11-04: 10 mL via INTRAVENOUS
  Filled 2021-11-04: qty 10

## 2021-11-19 ENCOUNTER — Ambulatory Visit
Admission: RE | Admit: 2021-11-19 | Discharge: 2021-11-19 | Disposition: A | Discharge status: Home / Self Care | Payer: BC Managed Care – PPO | Source: Ambulatory Visit | Attending: Oncology | Admitting: Oncology

## 2021-11-19 DIAGNOSIS — C775 Secondary and unspecified malignant neoplasm of intrapelvic lymph nodes: Secondary | ICD-10-CM | POA: Diagnosis not present

## 2021-11-19 DIAGNOSIS — C539 Malignant neoplasm of cervix uteri, unspecified: Secondary | ICD-10-CM | POA: Insufficient documentation

## 2021-11-19 DIAGNOSIS — N200 Calculus of kidney: Secondary | ICD-10-CM | POA: Diagnosis not present

## 2021-11-19 DIAGNOSIS — Z9221 Personal history of antineoplastic chemotherapy: Secondary | ICD-10-CM | POA: Insufficient documentation

## 2021-11-19 DIAGNOSIS — C760 Malignant neoplasm of head, face and neck: Secondary | ICD-10-CM | POA: Diagnosis not present

## 2021-11-19 DIAGNOSIS — R87820 Cervical low risk human papillomavirus (HPV) DNA test positive: Secondary | ICD-10-CM | POA: Diagnosis not present

## 2021-11-19 DIAGNOSIS — Z923 Personal history of irradiation: Secondary | ICD-10-CM | POA: Insufficient documentation

## 2021-11-19 LAB — GLUCOSE, CAPILLARY: Glucose-Capillary: 98 mg/dL (ref 70–99)

## 2021-11-19 MED ORDER — FLUDEOXYGLUCOSE F - 18 (FDG) INJECTION
11.0000 | Freq: Once | INTRAVENOUS | Status: AC | PRN
  Administered 2021-11-19: 11.94 via INTRAVENOUS

## 2021-11-21 ENCOUNTER — Inpatient Hospital Stay (HOSPITAL_BASED_OUTPATIENT_CLINIC_OR_DEPARTMENT_OTHER): Payer: BC Managed Care – PPO | Admitting: Oncology

## 2021-11-21 ENCOUNTER — Inpatient Hospital Stay: Payer: BC Managed Care – PPO

## 2021-11-21 ENCOUNTER — Encounter: Payer: Self-pay | Admitting: Oncology

## 2021-11-21 VITALS — BP 120/78 | HR 91 | Temp 98.5°F | Resp 18 | Wt 210.3 lb

## 2021-11-21 DIAGNOSIS — E8940 Asymptomatic postprocedural ovarian failure: Secondary | ICD-10-CM | POA: Diagnosis not present

## 2021-11-21 DIAGNOSIS — Z452 Encounter for adjustment and management of vascular access device: Secondary | ICD-10-CM | POA: Diagnosis not present

## 2021-11-21 DIAGNOSIS — C539 Malignant neoplasm of cervix uteri, unspecified: Secondary | ICD-10-CM | POA: Diagnosis not present

## 2021-11-21 DIAGNOSIS — Z95828 Presence of other vascular implants and grafts: Secondary | ICD-10-CM

## 2021-11-21 LAB — COMPREHENSIVE METABOLIC PANEL
ALT: 34 U/L (ref 0–44)
AST: 31 U/L (ref 15–41)
Albumin: 4.1 g/dL (ref 3.5–5.0)
Alkaline Phosphatase: 73 U/L (ref 38–126)
Anion gap: 4 — ABNORMAL LOW (ref 5–15)
BUN: 11 mg/dL (ref 6–20)
CO2: 28 mmol/L (ref 22–32)
Calcium: 9 mg/dL (ref 8.9–10.3)
Chloride: 104 mmol/L (ref 98–111)
Creatinine, Ser: 0.89 mg/dL (ref 0.44–1.00)
GFR, Estimated: >60 mL/min (ref >60)
Glucose, Bld: 104 mg/dL — ABNORMAL HIGH (ref 70–99)
Potassium: 3.8 mmol/L (ref 3.5–5.1)
Sodium: 136 mmol/L (ref 135–145)
Total Bilirubin: 0.7 mg/dL (ref 0.3–1.2)
Total Protein: 7.4 g/dL (ref 6.5–8.1)

## 2021-11-21 LAB — CBC WITH DIFFERENTIAL/PLATELET
Abs Immature Granulocytes: 0 10*3/uL (ref 0.00–0.07)
Basophils Absolute: 0 10*3/uL (ref 0.0–0.1)
Basophils Relative: 1 %
Eosinophils Absolute: 0.2 10*3/uL (ref 0.0–0.5)
Eosinophils Relative: 5 %
HCT: 40.9 % (ref 36.0–46.0)
Hemoglobin: 14.7 g/dL (ref 12.0–15.0)
Immature Granulocytes: 0 %
Lymphocytes Relative: 23 %
Lymphs Abs: 0.7 10*3/uL (ref 0.7–4.0)
MCH: 33.6 pg (ref 26.0–34.0)
MCHC: 35.9 g/dL (ref 30.0–36.0)
MCV: 93.6 fL (ref 80.0–100.0)
Monocytes Absolute: 0.3 10*3/uL (ref 0.1–1.0)
Monocytes Relative: 10 %
Neutro Abs: 1.9 10*3/uL (ref 1.7–7.7)
Neutrophils Relative %: 61 %
Platelets: 223 10*3/uL (ref 150–400)
RBC: 4.37 MIL/uL (ref 3.87–5.11)
RDW: 11.1 % — ABNORMAL LOW (ref 11.5–15.5)
WBC: 3 10*3/uL — ABNORMAL LOW (ref 4.0–10.5)
nRBC: 0 % (ref 0.0–0.2)

## 2021-11-21 LAB — MAGNESIUM: Magnesium: 1.9 mg/dL (ref 1.7–2.4)

## 2021-11-23 ENCOUNTER — Encounter: Payer: Self-pay | Admitting: Oncology

## 2021-11-23 NOTE — Progress Notes (Signed)
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  She was seen by GYN and had a cervical exam which showed a large pink friable vascularized irregular growth in the cervix extending from 11:00 to 5 o'clock position.  Pap showed ASCUS and high risk HPV.  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  She is married and uses NuvaRing for contraception.  She does not have any children yet.  She has had 2 Gardasil vaccinations in the past but did not receive a booster patient was seen by GYN oncology Dr. Fransisca Connors and plan was to obtain a PET CT scan to decide if she would be a candidate for surgery.  PET scan showed hypermetabolic activity of the cervix compatible with primary malignancy.  Hypermetabolic subcentimeter bilateral external iliac lymph nodes concerning for metastatic disease.  Hypermetabolic left inguinal lymph node concerning for additional site of metastatic disease.  Bilateral nonobstructing renal stones.   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 She feels like the pill makes her feel bloated. She would like to find an alternative pill with fewer side effects  ECOG PS- 0 Pain scale- 0   Review of systems- Review of Systems   Constitutional:  Negative for chills, fever, malaise/fatigue and weight loss.  HENT:  Negative for congestion, ear discharge and nosebleeds.   Eyes:  Negative for blurred vision.  Respiratory:  Negative for cough, hemoptysis, sputum production, shortness of breath and wheezing.   Cardiovascular:  Negative for chest pain, palpitations, orthopnea and claudication.  Gastrointestinal:  Negative for abdominal pain, blood in stool, constipation, diarrhea, heartburn, melena, nausea and vomiting.  Genitourinary:  Negative for dysuria, flank pain, frequency, hematuria and urgency.  Musculoskeletal:  Negative for back pain, joint pain and myalgias.  Skin:  Negative for rash.  Neurological:  Negative for dizziness, tingling, focal weakness, seizures, weakness and headaches.  Endo/Heme/Allergies:  Does not bruise/bleed easily.  Psychiatric/Behavioral:  Negative for depression and suicidal ideas. The patient does not have insomnia.       No Known Allergies   Past Medical History:  Diagnosis Date   Allergic rhinitis    Allergy    Cervical cancer (HCC)      Past Surgical History:  Procedure Laterality Date   IR IMAGING GUIDED PORT INSERTION  06/30/2021   TONSILECTOMY, ADENOIDECTOMY, BILATERAL MYRINGOTOMY AND TUBES Bilateral    TONSILLECTOMY     WISDOM TOOTH EXTRACTION      Social History   Socioeconomic History   Marital status: Married    Spouse name: Not on file   Number of children: Not on file   Years of education: Not on file   Highest education level: Not on file  Occupational History   Not on file  Tobacco Use   Smoking status: Never   Smokeless  tobacco: Never  Vaping Use   Vaping Use: Never used  Substance and Sexual Activity   Alcohol use: Yes    Alcohol/week: 1.0 standard drink of alcohol    Types: 1 Glasses of wine per week    Comment: occasional--every couple of weeks.   Drug use: No   Sexual activity: Yes  Other Topics Concern   Not on file  Social History  Narrative   Not on file   Social Determinants of Health   Financial Resource Strain: Not on file  Food Insecurity: Not on file  Transportation Needs: Not on file  Physical Activity: Not on file  Stress: Not on file  Social Connections: Not on file  Intimate Partner Violence: Not on file    Family History  Problem Relation Age of Onset   Heart disease Father    Breast cancer Maternal Grandmother    Lung cancer Paternal Grandmother    Heart attack Paternal Grandfather      Current Outpatient Medications:    acetaminophen (TYLENOL) 500 MG tablet, Take 500 mg by mouth every 6 (six) hours as needed., Disp: , Rfl:    hydrOXYzine (ATARAX) 10 MG tablet, SMARTSIG:1-4 Tablet(s) By Mouth Every 6 Hours PRN, Disp: , Rfl:    levonorgestrel-ethinyl estradiol (LYBREL) 90-20 MCG tablet, Take 1 tablet by mouth daily., Disp: 28 tablet, Rfl: 11   sertraline (ZOLOFT) 25 MG tablet, Take 25 mg by mouth daily., Disp: , Rfl:    aspirin-acetaminophen-caffeine (EXCEDRIN MIGRAINE) 250-250-65 MG tablet, Take by mouth every 6 (six) hours as needed for headache. (Patient not taking: Reported on 11/21/2021), Disp: , Rfl:    lidocaine-prilocaine (EMLA) cream, Apply to affected area once (Patient not taking: Reported on 11/21/2021), Disp: 30 g, Rfl: 3 No current facility-administered medications for this visit.  Facility-Administered Medications Ordered in Other Visits:    filgrastim-sndz (ZARXIO) injection 480 mcg, 480 mcg, Subcutaneous, Once, Berchuck, Andrew, MD   sodium chloride flush (NS) 0.9 % injection 10 mL, 10 mL, Intravenous, PRN, Sindy Guadeloupe, MD, 10 mL at 07/07/21 0841  Physical exam:  Vitals:   11/21/21 1308  BP: 120/78  Pulse: 91  Resp: 18  Temp: 98.5 F (36.9 C)  SpO2: 100%  Weight: 210 lb 4.8 oz (95.4 kg)   Physical Exam Constitutional:      General: She is not in acute distress. Cardiovascular:     Rate and Rhythm: Normal rate and regular rhythm.     Heart sounds: Normal heart  sounds.  Pulmonary:     Effort: Pulmonary effort is normal.  Skin:    General: Skin is warm and dry.  Neurological:     Mental Status: She is alert and oriented to person, place, and time.         Latest Ref Rng & Units 11/21/2021   12:46 PM  CMP  Glucose 70 - 99 mg/dL 104   BUN 6 - 20 mg/dL 11   Creatinine 0.44 - 1.00 mg/dL 0.89   Sodium 135 - 145 mmol/L 136   Potassium 3.5 - 5.1 mmol/L 3.8   Chloride 98 - 111 mmol/L 104   CO2 22 - 32 mmol/L 28   Calcium 8.9 - 10.3 mg/dL 9.0   Total Protein 6.5 - 8.1 g/dL 7.4   Total Bilirubin 0.3 - 1.2 mg/dL 0.7   Alkaline Phos 38 - 126 U/L 73   AST 15 - 41 U/L 31   ALT 0 - 44 U/L 34  Latest Ref Rng & Units 11/21/2021   12:46 PM  CBC  WBC 4.0 - 10.5 K/uL 3.0   Hemoglobin 12.0 - 15.0 g/dL 14.7   Hematocrit 36.0 - 46.0 % 40.9   Platelets 150 - 400 K/uL 223     No images are attached to the encounter.  NM PET Image Restag (PS) Skull Base To Thigh  Result Date: 11/20/2021 CLINICAL DATA:  Subsequent treatment strategy for cervical cancer and HPV positive. Status post chemotherapy and radiation therapy. EXAM: NUCLEAR MEDICINE PET SKULL BASE TO THIGH TECHNIQUE: 11.9 mCi F-18 FDG was injected intravenously. Full-ring PET imaging was performed from the skull base to thigh after the radiotracer. CT data was obtained and used for attenuation correction and anatomic localization. Fasting blood glucose: 98 mg/dl COMPARISON:  06/19/2021 FINDINGS: Mediastinal blood pool activity: SUV max 1.3 Liver activity: SUV max NA NECK: No cervical nodal hypermetabolism. There is hypermetabolic" brown" fat in the low neck and upper chest, minimally decreasing sensitivity. Incidental CT findings: No cervical adenopathy. CHEST: No pulmonary parenchymal or thoracic nodal hypermetabolism. Incidental CT findings: Right Port-A-Cath tip at mid right atrium. No thoracic adenopathy. Clear lungs. ABDOMEN/PELVIS: No abdominal parenchymal or nodal hypermetabolism. The  previously described external iliac and left inguinal hypermetabolic nodes have resolved. The cervical hypermetabolism is also resolved. Incidental CT findings: Normal noncontrast appearance of the liver, pancreas, spleen, adrenal glands, gallbladder. Bilateral punctate renal collecting system calculi, without urinary tract obstruction. No bladder stone. No cervical mass. No adnexal mass. SKELETON: No abnormal marrow activity. Incidental CT findings: None. IMPRESSION: 1. Complete metabolic response to therapy of cervical primary and pelvic nodal metastasis. 2. Bilateral nephrolithiasis. Electronically Signed   By: Abigail Miyamoto M.D.   On: 11/20/2021 16:47     Assessment and plan- Patient is a 31 y.o. female stage IIIC HPV positive cervical cancer.  She is s/p concurrent chemoradiation with weekly cisplatin followed by vaginal brachytherapy. She is here to discuss pet scan results and further management  I have reviewed PET scan images independently and discussed findings with the patient. Pet scan shows complete metabolic response to treatment. Resolution of cervical hypermetabolism and pelvic adenopathy. She has chronic nephrolithiasis.   In terms of surveillance- she will see me and GYN Onc alternately evee 6 monts. Scans based on clinical susipicion but not indicated routinely. She will also be seeing NP Beckey Rutter to discuss survivorship including focus on sexual health.   I will see her back in 4 months   Visit Diagnosis 1. 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 Cervical cancer, FIGO stage III ([place])      Dr. Randa Evens, MD, MPH [facility] [phone] 11/23/2021 8:06 PM

## 2021-11-26 ENCOUNTER — Inpatient Hospital Stay: Payer: BC Managed Care – PPO

## 2021-11-26 NOTE — Progress Notes (Deleted)
Gynecologic Oncology Interval Visit   Referring Provider: Dr Leafy Ro  Chief Concern: cervical cancer  Subjective:  Dawn Camacho is a 31 y.o. G77 female with HPV positive FIGO stage IIIC1 who was initially seen in consultation from Dr. Leafy Ro.  She is s/p concurrent chemoradiation with weekly cisplatin followed by brachytherapy.   From 07/09/21, she began chemoradiation with Dr. Donella Stade of [facility] including 50Gy to the cervix and elective pelvis, a 60Gy boost to the involved pelvic nodes, and concurrent cisplatin.  Brachytherapy with Dr. Christel Mormon at [facility] completed treatment on 09/15/2021.    Region Treated & Technique  Isotope   #of Fx  Fx Size Treatment Dates  Cervix, T&O/R HDR- Ir192 4 600 cGy 7/10 - 09/15/21  Total Dose 2400 cGy     Post treatment PET  PET 11/19/2021 IMPRESSION: 1. Complete metabolic response to therapy of cervical primary and pelvic nodal metastasis. 2. Bilateral nephrolithiasis.    Gynecologic Oncology History Dawn Camacho presented on 05/08/21 with post coital spotting x 1.5 months. Cervical exam: Large, pink, friable and highly vascularized, non-tender, not smooth, irregular cervical growth extending from 11-5.   PAP ASCUS-H/HPV+. Patient's last menstrual period was 04/29/2021.    Colposcopy confirmed irregular cervical growth extending from 11-5:00. 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   Biopsies collected 05/26/2021  A: Cervix, 6:00, biopsy - Invasive moderately to poorly differentiated squamous cell carcinoma - See comment   C: Endocervix, cervix, curetting: Invasive moderately to poorly differentiated squamous cell carcinoma   PET scan showed hypermetabolic activity of the cervix compatible with primary malignancy.  Hypermetabolic subcentimeter bilateral external iliac lymph nodes concerning for metastatic disease.  Hypermetabolic left inguinal lymph node concerning for additional site of metastatic  disease.  Bilateral nonobstructing renal stones.  Plan is to proceed with definitive concurrent chemoradiation with weekly cisplatin.    Married and uses Nuva ring for contraception. Had 2 Gardesil vaccinations, but then last in the series was not done due to recall or some other problem.  Problem List: Patient Active Problem List   Diagnosis Date Noted   Cervical cancer, FIGO stage III ([place]) 06/24/2021   Cervical cancer ([place]) 06/11/2021   Dyshydrosis 11/21/2015    Past Medical History: Past Medical History:  Diagnosis Date   Allergic rhinitis    Allergy    Cervical cancer ([place])     Past Surgical History: Past Surgical History:  Procedure Laterality Date   IR IMAGING GUIDED PORT INSERTION  06/30/2021   TONSILECTOMY, ADENOIDECTOMY, BILATERAL MYRINGOTOMY AND TUBES Bilateral    TONSILLECTOMY     WISDOM TOOTH EXTRACTION          Family History: Family History  Problem Relation Age of Onset   Heart disease Father    Breast cancer Maternal Grandmother    Lung cancer Paternal Grandmother    Heart attack Paternal Grandfather     Social History: Social History   Socioeconomic History   Marital status: Married    Spouse name: Not on file   Number of children: Not on file   Years of education: Not on file   Highest education level: Not on file  Occupational History   Not on file  Tobacco Use   Smoking status: Never   Smokeless tobacco: Never  Vaping Use   Vaping Use: Never used  Substance and Sexual Activity   Alcohol use: Yes    Alcohol/week: 1.0 standard drink of alcohol    Types: 1 Glasses of wine per  week    Comment: occasional--every couple of weeks.   Drug use: No   Sexual activity: Yes  Other Topics Concern   Not on file  Social History Narrative   Not on file   Social Determinants of Health   Financial Resource Strain: Not on file  Food Insecurity: Not on file  Transportation Needs: Not on file  Physical Activity: Not on file  Stress: Not on  file  Social Connections: Not on file  Intimate Partner Violence: Not on file    Allergies: No Known Allergies  Current Medications: Current Outpatient Medications  Medication Sig Dispense Refill   acetaminophen (TYLENOL) 500 MG tablet Take 500 mg by mouth every 6 (six) hours as needed.     aspirin-acetaminophen-caffeine (EXCEDRIN MIGRAINE) 250-250-65 MG tablet Take by mouth every 6 (six) hours as needed for headache. (Patient not taking: Reported on 11/21/2021)     hydrOXYzine (ATARAX) 10 MG tablet SMARTSIG:1-4 Tablet(s) By Mouth Every 6 Hours PRN     levonorgestrel-ethinyl estradiol (LYBREL) 90-20 MCG tablet Take 1 tablet by mouth daily. 28 tablet 11   lidocaine-prilocaine (EMLA) cream Apply to affected area once (Patient not taking: Reported on 11/21/2021) 30 g 3   sertraline (ZOLOFT) 25 MG tablet Take 25 mg by mouth daily.     No current facility-administered medications for this visit.   Facility-Administered Medications Ordered in Other Visits  Medication Dose Route Frequency Provider Last Rate Last Admin   filgrastim-sndz (ZARXIO) injection 480 mcg  480 mcg Subcutaneous Once Mellody Drown, MD       sodium chloride flush (NS) 0.9 % injection 10 mL  10 mL Intravenous PRN Sindy Guadeloupe, MD   10 mL at 07/07/21 0841    Review of Systems ***General: no complaints  HEENT: no complaints  Lungs: no complaints  Cardiac: no complaints  GI: no complaints  GU: no complaints  Musculoskeletal: no complaints  Extremities: no complaints  Skin: no complaints  Neuro: no complaints  Endocrine: no complaints  Psych: no complaints       Objective:  Physical Examination:  There were no vitals taken for this visit.  ***GENERAL: Patient is a well appearing female in no acute distress HEENT:  PERRL, neck supple with midline trachea. Thyroid without masses.  NODES:  No cervical, supraclavicular, axillary, or inguinal lymphadenopathy palpated.  LUNGS:  Clear to auscultation  bilaterally.  No wheezes or rhonchi. HEART:  Regular rate and rhythm. No murmur appreciated. ABDOMEN:  Soft, nontender.  Positive, normoactive bowel sounds.  MSK:  No focal spinal tenderness to palpation. Full range of motion bilaterally in the upper extremities. EXTREMITIES:  No peripheral edema.   SKIN:  Clear with no obvious rashes or skin changes. No nail dyscrasia. NEURO:  Nonfocal. Well oriented.  Appropriate affect.  ***Pelvic: EGBUS: no lesions Cervix: no lesions, nontender, mobile Vagina: no lesions, no discharge or bleeding Uterus: normal size, nontender, mobile Adnexa: no palpable masses Rectovaginal: confirmatory   Pelvic: exam chaperoned by nurse;  Vulva: normal appearing vulva with no masses, tenderness or lesions; Vagina: normal vagina; Adnexa: normal adnexa in size, nontender and no masses; Uterus: uterus is normal size, shape, consistency and nontender; Cervix: 4 cm exophytic mass growing out of cervix; Rectal: normal rectal, no masses    Lab Review   Last CBC Lab Results  Component Value Date   WBC 3.0 (L) 11/21/2021   HGB 14.7 11/21/2021   HCT 40.9 11/21/2021   MCV 93.6 11/21/2021   MCH 33.6 11/21/2021  RDW 11.1 (L) 11/21/2021   PLT 223 11/21/2021      Chemistry      Component Value Date/Time   NA 136 11/21/2021 1246   K 3.8 11/21/2021 1246   CL 104 11/21/2021 1246   CO2 28 11/21/2021 1246   BUN 11 11/21/2021 1246   CREATININE 0.89 11/21/2021 1246      Component Value Date/Time   CALCIUM 9.0 11/21/2021 1246   ALKPHOS 73 11/21/2021 1246   AST 31 11/21/2021 1246   ALT 34 11/21/2021 1246   BILITOT 0.7 11/21/2021 1246      Radiologic imaging As Per HPI   11/20/2021   NM PET Image Restag (PS) Skull Base To Thigh CLINICAL DATA:  Subsequent treatment strategy for cervical cancer and HPV positive. Status post chemotherapy and radiation therapy. EXAM: NUCLEAR MEDICINE PET SKULL BASE TO THIGH TECHNIQUE: 11.9 mCi F-18 FDG was injected intravenously.  Full-ring PET imaging was performed from the skull base to thigh after the radiotracer. CT data was obtained and used for attenuation correction and anatomic localization. Fasting blood glucose: 98 mg/dl COMPARISON:  06/19/2021 FINDINGS: Mediastinal blood pool activity: SUV max 1.3 Liver activity: SUV max NA NECK: No cervical nodal hypermetabolism. There is hypermetabolic" brown" fat in the low neck and upper chest, minimally decreasing sensitivity. Incidental CT findings: No cervical adenopathy. CHEST: No pulmonary parenchymal or thoracic nodal hypermetabolism. Incidental CT findings: Right Port-A-Cath tip at mid right atrium. No thoracic adenopathy. Clear lungs. ABDOMEN/PELVIS: No abdominal parenchymal or nodal hypermetabolism. The previously described external iliac and left inguinal hypermetabolic nodes have resolved. The cervical hypermetabolism is also resolved. Incidental CT findings: Normal noncontrast appearance of the liver, pancreas, spleen, adrenal glands, gallbladder. Bilateral punctate renal collecting system calculi, without urinary tract obstruction. No bladder stone. No cervical mass. No adnexal mass. SKELETON: No abnormal marrow activity. Incidental CT findings: None. IMPRESSION: 1. Complete metabolic response to therapy of cervical primary and pelvic nodal metastasis. 2. Bilateral nephrolithiasis. Electronically Signed   By: Abigail Miyamoto M.D.   On: 11/20/2021 16:47        Assessment:  Dawn Camacho is a 31 y.o. G54 female diagnosed with HPV positive FIGO stage IIIC1 s/p concurrent chemoradiation with weekly cisplatin followed by vaginal brachytherapy. Clinically NED on PET  Medical co-morbidities complicating care: anxiety.  Plan:   Problem List Items Addressed This Visit       Genitourinary   Cervical cancer, FIGO stage III ([place]) - Primary    I have recommended continued close follow up with exams, including pelvic exams every 3-6 months for 2 years, then every 6-12 months for  3-5 years and then annually thereafter. Cervical/vaginal cytology screening annually, as indicated for the detection of lower genital tract neoplasia. Imaging and laboratory assessment is based on clinical indication. Patient education for obesity, lifestyle, exercise, nutrition, smoking cessation, sexual health, vaginal lubricants.   ***We discussed her weight and need for weight loss, stressed good nutrition, and exercise.  I provided information regarding calorie counting and exercise for weight loss. I offered referral to the CARE program and provided a pamphlet today.     The patient's diagnosis, an outline of the further diagnostic and laboratory studies which will be required, the recommendation, and alternatives were discussed.  All questions were answered to the patient's satisfaction.    Swanson Farnell Gaetana Michaelis, MD

## 2021-12-17 DIAGNOSIS — C539 Malignant neoplasm of cervix uteri, unspecified: Secondary | ICD-10-CM | POA: Diagnosis not present

## 2021-12-17 DIAGNOSIS — C538 Malignant neoplasm of overlapping sites of cervix uteri: Secondary | ICD-10-CM | POA: Diagnosis not present

## 2021-12-17 DIAGNOSIS — F4323 Adjustment disorder with mixed anxiety and depressed mood: Secondary | ICD-10-CM | POA: Diagnosis not present

## 2021-12-31 ENCOUNTER — Ambulatory Visit: Payer: BC Managed Care – PPO

## 2022-01-06 DIAGNOSIS — F4323 Adjustment disorder with mixed anxiety and depressed mood: Secondary | ICD-10-CM | POA: Diagnosis not present

## 2022-01-14 ENCOUNTER — Encounter: Payer: Self-pay | Admitting: Radiation Oncology

## 2022-01-14 ENCOUNTER — Ambulatory Visit
Admission: RE | Admit: 2022-01-14 | Discharge: 2022-01-14 | Disposition: A | Discharge status: Home / Self Care | Payer: BC Managed Care – PPO | Source: Ambulatory Visit | Attending: Radiation Oncology | Admitting: Radiation Oncology

## 2022-01-14 VITALS — BP 126/86 | HR 66 | Temp 97.6°F | Resp 20 | Wt 206.0 lb

## 2022-01-14 DIAGNOSIS — C531 Malignant neoplasm of exocervix: Secondary | ICD-10-CM | POA: Diagnosis not present

## 2022-01-14 DIAGNOSIS — Z8541 Personal history of malignant neoplasm of cervix uteri: Secondary | ICD-10-CM | POA: Diagnosis not present

## 2022-01-14 DIAGNOSIS — Z923 Personal history of irradiation: Secondary | ICD-10-CM | POA: Diagnosis not present

## 2022-01-14 DIAGNOSIS — C539 Malignant neoplasm of cervix uteri, unspecified: Secondary | ICD-10-CM

## 2022-01-14 NOTE — Progress Notes (Signed)
Radiation Oncology Follow up Note  Name: Dawn Camacho   Date:   01/14/2022 MRN:  [id number] DOB: 24-Dec-1990    This 31 y.o. female presents to the clinic today for 28-monthfollow-up status post whole pelvic radiation therapy [facility] N1 M0 squamous [facility] of the cervix.  REFERRING PROVIDER: No ref. provider found  HPI: Patient is a 31year old female now out for months having [facility] external beam radiation [facility].  Seen today in routine follow-up she is doing well she specifically denies any increased lower urinary tract symptoms diarrhea or fatigue.  [facility].  She also had a PET scan which I have reviewed back in September showing complete metabolic response to therapy of both primary and pelvic nodal metastasis.  COMPLICATIONS OF TREATMENT: none  FOLLOW UP COMPLIANCE: keeps appointments   PHYSICAL EXAM:  BP 126/86 (BP Location: Left Arm, Patient Position: Sitting, Cuff Size: Normal)   Pulse 66   Temp 97.6 F (36.4 C) (Tympanic)   Resp 20   Wt 206 lb (93.4 kg)   BMI 33.25 kg/m  Well-developed well-nourished patient in NAD. HEENT reveals PERLA, EOMI, discs not visualized.  Oral cavity is clear. No oral mucosal lesions are identified. Neck is clear without evidence of cervical or supraclavicular adenopathy. Lungs are clear to A&P. Cardiac examination is essentially unremarkable with regular rate and rhythm without murmur rub or thrill. Abdomen is benign with no organomegaly or masses noted. Motor sensory and DTR levels are equal and symmetric in the upper and lower extremities. Cranial nerves II through XII are grossly intact. Proprioception is intact. No peripheral adenopathy or edema is identified. No motor or sensory levels are noted. Crude visual fields are within normal  range.  RADIOLOGY RESULTS: PET CT scan reviewed compatible with above-stated findings  PLAN: At the present time patient is doing well with no evidence of disease by PET/CT criteria she has a very low side effect profile.  I am pleased with her overall progress.  I have asked to see her back in 6 months for follow-up.  We are also arranging for her to start [place]  Patient is to call with any concerns.  I would like to take this opportunity to thank you for allowing me to participate in the care of your patient..Noreene Filbert MD

## 2022-01-20 DIAGNOSIS — H53149 Visual discomfort, unspecified: Secondary | ICD-10-CM | POA: Diagnosis not present

## 2022-01-20 DIAGNOSIS — G43009 Migraine without aura, not intractable, without status migrainosus: Secondary | ICD-10-CM | POA: Diagnosis not present

## 2022-01-20 DIAGNOSIS — F40298 Other specified phobia: Secondary | ICD-10-CM | POA: Diagnosis not present

## 2022-01-20 DIAGNOSIS — G44209 Tension-type headache, unspecified, not intractable: Secondary | ICD-10-CM | POA: Diagnosis not present

## 2022-01-21 ENCOUNTER — Inpatient Hospital Stay: Payer: BC Managed Care – PPO | Attending: Oncology

## 2022-01-21 DIAGNOSIS — Z452 Encounter for adjustment and management of vascular access device: Secondary | ICD-10-CM | POA: Diagnosis not present

## 2022-01-21 DIAGNOSIS — Z95828 Presence of other vascular implants and grafts: Secondary | ICD-10-CM

## 2022-01-21 DIAGNOSIS — C539 Malignant neoplasm of cervix uteri, unspecified: Secondary | ICD-10-CM | POA: Insufficient documentation

## 2022-01-21 MED ORDER — HEPARIN SOD (PORK) LOCK FLUSH 100 UNIT/ML IV SOLN
500.0000 [IU] | Freq: Once | INTRAVENOUS | Status: AC
  Administered 2022-01-21: 500 [IU] via INTRAVENOUS
  Filled 2022-01-21: qty 5

## 2022-01-21 MED ORDER — SODIUM CHLORIDE 0.9% FLUSH
10.0000 mL | INTRAVENOUS | Status: DC | PRN
  Administered 2022-01-21: 10 mL via INTRAVENOUS
  Filled 2022-01-21: qty 10

## 2022-01-21 NOTE — Patient Instructions (Signed)

## 2022-01-23 ENCOUNTER — Other Ambulatory Visit: Payer: Self-pay | Admitting: Physician Assistant

## 2022-01-23 DIAGNOSIS — R519 Headache, unspecified: Secondary | ICD-10-CM

## 2022-01-23 DIAGNOSIS — Z8541 Personal history of malignant neoplasm of cervix uteri: Secondary | ICD-10-CM

## 2022-02-24 ENCOUNTER — Ambulatory Visit
Admission: RE | Admit: 2022-02-24 | Discharge: 2022-02-24 | Disposition: A | Discharge status: Home / Self Care | Payer: BC Managed Care – PPO | Source: Ambulatory Visit | Attending: Physician Assistant | Admitting: Physician Assistant

## 2022-02-24 DIAGNOSIS — Z8541 Personal history of malignant neoplasm of cervix uteri: Secondary | ICD-10-CM

## 2022-02-24 DIAGNOSIS — R519 Headache, unspecified: Secondary | ICD-10-CM

## 2022-02-24 MED ORDER — GADOBENATE DIMEGLUMINE 529 MG/ML IV SOLN
15.0000 mL | Freq: Once | INTRAVENOUS | Status: AC | PRN
  Administered 2022-02-24: 15 mL via INTRAVENOUS

## 2022-03-23 ENCOUNTER — Other Ambulatory Visit: Payer: BC Managed Care – PPO

## 2022-03-23 ENCOUNTER — Ambulatory Visit: Payer: BC Managed Care – PPO | Admitting: Oncology

## 2022-03-23 ENCOUNTER — Inpatient Hospital Stay: Payer: BC Managed Care – PPO

## 2022-03-25 ENCOUNTER — Inpatient Hospital Stay: Payer: BC Managed Care – PPO | Attending: Oncology | Admitting: Obstetrics and Gynecology

## 2022-03-25 ENCOUNTER — Inpatient Hospital Stay: Payer: BC Managed Care – PPO

## 2022-03-25 VITALS — BP 125/82 | HR 76 | Temp 97.8°F | Resp 20 | Wt 202.1 lb

## 2022-03-25 DIAGNOSIS — N939 Abnormal uterine and vaginal bleeding, unspecified: Secondary | ICD-10-CM | POA: Diagnosis not present

## 2022-03-25 DIAGNOSIS — Z79899 Other long term (current) drug therapy: Secondary | ICD-10-CM | POA: Insufficient documentation

## 2022-03-25 DIAGNOSIS — C539 Malignant neoplasm of cervix uteri, unspecified: Secondary | ICD-10-CM

## 2022-03-25 DIAGNOSIS — Z793 Long term (current) use of hormonal contraceptives: Secondary | ICD-10-CM | POA: Insufficient documentation

## 2022-03-25 DIAGNOSIS — G629 Polyneuropathy, unspecified: Secondary | ICD-10-CM | POA: Diagnosis not present

## 2022-03-25 DIAGNOSIS — N200 Calculus of kidney: Secondary | ICD-10-CM | POA: Insufficient documentation

## 2022-03-25 DIAGNOSIS — Z801 Family history of malignant neoplasm of trachea, bronchus and lung: Secondary | ICD-10-CM | POA: Insufficient documentation

## 2022-03-25 DIAGNOSIS — E8940 Asymptomatic postprocedural ovarian failure: Secondary | ICD-10-CM

## 2022-03-25 DIAGNOSIS — Z8249 Family history of ischemic heart disease and other diseases of the circulatory system: Secondary | ICD-10-CM | POA: Insufficient documentation

## 2022-03-25 DIAGNOSIS — Z923 Personal history of irradiation: Secondary | ICD-10-CM | POA: Diagnosis not present

## 2022-03-25 DIAGNOSIS — C775 Secondary and unspecified malignant neoplasm of intrapelvic lymph nodes: Secondary | ICD-10-CM | POA: Insufficient documentation

## 2022-03-25 DIAGNOSIS — G935 Compression of brain: Secondary | ICD-10-CM | POA: Diagnosis not present

## 2022-03-25 DIAGNOSIS — Z803 Family history of malignant neoplasm of breast: Secondary | ICD-10-CM | POA: Diagnosis not present

## 2022-03-25 DIAGNOSIS — R5383 Other fatigue: Secondary | ICD-10-CM

## 2022-03-25 DIAGNOSIS — R140 Abdominal distension (gaseous): Secondary | ICD-10-CM | POA: Insufficient documentation

## 2022-03-25 LAB — COMPREHENSIVE METABOLIC PANEL
ALT: 24 U/L (ref 0–44)
AST: 22 U/L (ref 15–41)
Albumin: 4.1 g/dL (ref 3.5–5.0)
Alkaline Phosphatase: 74 U/L (ref 38–126)
Anion gap: 9 (ref 5–15)
BUN: 12 mg/dL (ref 6–20)
CO2: 25 mmol/L (ref 22–32)
Calcium: 9.3 mg/dL (ref 8.9–10.3)
Chloride: 104 mmol/L (ref 98–111)
Creatinine, Ser: 0.78 mg/dL (ref 0.44–1.00)
GFR, Estimated: >60 mL/min (ref >60)
Glucose, Bld: 90 mg/dL (ref 70–99)
Potassium: 4.1 mmol/L (ref 3.5–5.1)
Sodium: 138 mmol/L (ref 135–145)
Total Bilirubin: 0.7 mg/dL (ref 0.3–1.2)
Total Protein: 7 g/dL (ref 6.5–8.1)

## 2022-03-25 LAB — CBC WITH DIFFERENTIAL/PLATELET
Abs Immature Granulocytes: 0.01 10*3/uL (ref 0.00–0.07)
Basophils Absolute: 0 10*3/uL (ref 0.0–0.1)
Basophils Relative: 1 %
Eosinophils Absolute: 0.3 10*3/uL (ref 0.0–0.5)
Eosinophils Relative: 7 %
HCT: 38.9 % (ref 36.0–46.0)
Hemoglobin: 13.8 g/dL (ref 12.0–15.0)
Immature Granulocytes: 0 %
Lymphocytes Relative: 24 %
Lymphs Abs: 0.9 10*3/uL (ref 0.7–4.0)
MCH: 32.2 pg (ref 26.0–34.0)
MCHC: 35.5 g/dL (ref 30.0–36.0)
MCV: 90.9 fL (ref 80.0–100.0)
Monocytes Absolute: 0.3 10*3/uL (ref 0.1–1.0)
Monocytes Relative: 9 %
Neutro Abs: 2.2 10*3/uL (ref 1.7–7.7)
Neutrophils Relative %: 59 %
Platelets: 229 10*3/uL (ref 150–400)
RBC: 4.28 MIL/uL (ref 3.87–5.11)
RDW: 12 % (ref 11.5–15.5)
WBC: 3.7 10*3/uL — ABNORMAL LOW (ref 4.0–10.5)
nRBC: 0 % (ref 0.0–0.2)

## 2022-03-25 MED ORDER — LEVONORGESTREL-ETHINYL ESTRAD 90-20 MCG PO TABS: 1.0000 | ORAL_TABLET | Freq: Every day | ORAL | 11 refills | Status: DC

## 2022-03-25 NOTE — Patient Instructions (Signed)
Please start taking calcium 1200 mg and vitamin D 1000 iu daily for bone health.

## 2022-03-25 NOTE — Progress Notes (Signed)
Gynecologic Oncology Interval Visit   Referring Provider: Dr Leafy Ro  Chief Concern: cervical cancer  Subjective:  Dawn Camacho is a 32 y.o. G31 female who is seen in consultation from Dr. Dr Leafy Ro for cervical cancer.  60Gy to cervix and nodes at [facility] under the care of Dr. Baruch Gouty in 07/10/2021-08/29/2021. T&O at [facility] with Dr. Christel Mormon.  7/10 - 09/15/21 Cervix, T&O/R Total Dose 2400 cGy (each fraction 600 cGy)  Post treatment PET negative.   12/17/2021 Dilator teaching done today, to avoid vaginal stenosis, performed at [facility].   She has vaginal spotting whenever she has sex.  Her abdominal bloating has improved over the past few months since she has completed radiation therapy.  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  She stopped taking her OCPs because she felt it was not helping with her hot flashes.  She uses her dilator 2-3 times a week and is also sexually active  02/24/2022 Brain MRI IMPRESSION: Borderline Chiari 1 with slight crowding of the craniocervical junction. Otherwise, normal brain MRI.  Gynecologic Oncology History Dawn Camacho is a pleasant y.o. G4 female who is seen in consultation from Dr. Dr Leafy Ro for cervical cancer.  05/08/21 seen by Midwife for post coital spotting x 1.5 months. Cervical exam: Large, pink, friable and highly vascularized, non-tender, not smooth, irregular cervical growth extending from 11-5.   PAP ASCUS-H/HPV+. Patient's last menstrual period was 04/29/2021.    Colposcopy confirmed irregular cervical growth extending from 11-5:00. 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   06/12/2021 MRI Pelvis IMPRESSION: 1. 4.2 x 3.7 x 2.9 cm cervical mass involving the anterior aspect of the cervix and also likely involving the anterior aspect of the lower uterine segment. This extends into the vagina mainly on the left side. No MR findings to suggest involvement of the  bladder, urethra rectum. 2. Borderline enlarged bilateral pelvic sidewall lymph nodes worrisome for metastatic disease.  06/19/2021 PET scan IMPRESSION: 1. Hypermetabolic activity of the cervix, compatible with primary malignancy. 2. Hypermetabolic subcentimeter bilateral external iliac lymph nodes, concerning for metastatic disease. 3. Mildly hypermetabolic left inguinal lymph node, concerning for additional site of metastatic disease. 4. Bilateral nonobstructing renal stones.  06/30/2021 Port placement  11/19/2021 PET scan IMPRESSION: 1. Complete metabolic response to therapy of cervical primary and pelvic nodal metastasis. 2. Bilateral nephrolithiasis.   Married and uses Nuva ring for contraception.  Has not had a strong desire for children, but not sure for certain.  Had 2 Gardesil vaccinations, but then last in the series was not done due to recall or some other problem.  Problem List: Patient Active Problem List   Diagnosis Date Noted   Cervical cancer, FIGO stage III ([place]) 06/24/2021   Cervical cancer ([place]) 06/11/2021   Dyshydrosis 11/21/2015    Past Medical History: Past Medical History:  Diagnosis Date   Allergic rhinitis    Allergy    Cervical cancer ([place])     Past Surgical History: Past Surgical History:  Procedure Laterality Date   IR IMAGING GUIDED PORT INSERTION  06/30/2021   TONSILECTOMY, ADENOIDECTOMY, BILATERAL MYRINGOTOMY AND TUBES Bilateral    TONSILLECTOMY     WISDOM TOOTH EXTRACTION          Family History: Family History  Problem Relation Age of Onset   Heart disease Father    Breast cancer Maternal Grandmother    Lung cancer Paternal Grandmother    Heart attack Paternal Grandfather  Social History: Social History   Socioeconomic History   Marital status: Married    Spouse name: Not on file   Number of children: Not on file   Years of education: Not on file   Highest education level: Not on file  Occupational History   Not on  file  Tobacco Use   Smoking status: Never   Smokeless tobacco: Never  Vaping Use   Vaping Use: Never used  Substance and Sexual Activity   Alcohol use: Yes    Alcohol/week: 1.0 standard drink of alcohol    Types: 1 Glasses of wine per week    Comment: occasional--every couple of weeks.   Drug use: No   Sexual activity: Yes  Other Topics Concern   Not on file  Social History Narrative   Not on file   Social Determinants of Health   Financial Resource Strain: Not on file  Food Insecurity: Not on file  Transportation Needs: Not on file  Physical Activity: Not on file  Stress: Not on file  Social Connections: Not on file  Intimate Partner Violence: Not on file    Allergies: No Known Allergies  Current Medications: Current Outpatient Medications  Medication Sig Dispense Refill   aspirin-acetaminophen-caffeine (EXCEDRIN MIGRAINE) 250-250-65 MG tablet Take by mouth every 6 (six) hours as needed for headache.     hydrOXYzine (ATARAX) 10 MG tablet SMARTSIG:1-4 Tablet(s) By Mouth Every 6 Hours PRN     lidocaine-prilocaine (EMLA) cream Apply to affected area once 30 g 3   sertraline (ZOLOFT) 25 MG tablet Take 25 mg by mouth daily.     levonorgestrel-ethinyl estradiol (LYBREL) 90-20 MCG tablet Take 1 tablet by mouth daily. (Patient not taking: Reported on 03/25/2022) 28 tablet 11   No current facility-administered medications for this visit.   Facility-Administered Medications Ordered in Other Visits  Medication Dose Route Frequency Provider Last Rate Last Admin   filgrastim-sndz (ZARXIO) injection 480 mcg  480 mcg Subcutaneous Once Mellody Drown, MD       sodium chloride flush (NS) 0.9 % injection 10 mL  10 mL Intravenous PRN Sindy Guadeloupe, MD   10 mL at 07/07/21 0841    Review of Systems General: fatigue  HEENT: no complaints  Lungs: no complaints  Cardiac: no complaints  GI: abdominal bloating  GU: no complaints  GYN: vaginal spotting during sex Musculoskeletal: no  complaints  Extremities: no complaints  Skin: no complaints  Neuro: no complaints  Endocrine: no complaints  Psych: numbness and tingling       Objective:  Physical Examination:  BP 125/82   Pulse 76   Temp 97.8 F (36.6 C)   Resp 20   Wt 202 lb 1.6 oz (91.7 kg)   SpO2 100%   BMI 32.62 kg/m    ECOG Performance Status: 0 - Asymptomatic  GENERAL: Patient is a well appearing female in no acute distress HEENT:  Atraumatic and normocephalic. PERRL, neck supple. NODES:  No cervical, supraclavicular, axillary, or inguinal lymphadenopathy palpated.  LUNGS:  Normal respiratory rate ABDOMEN:  Soft, nontender. Nondistended. No masses/ascites/hernia/or hepatomegaly.  EXTREMITIES:  No peripheral edema.   SKIN:  Clear with no obvious rashes or skin changes. No nail dyscrasia. NEURO:  Nonfocal. Well oriented.  Appropriate affect.  Pelvic:exam chaperoned by CMA EGBUS: no lesions Cervix: Unable to completely visualize as os due to downward positioning.  No gross lesions seen on the anterior aspects Vagina: no gross lesions, positive bleeding from atrophic sites in the upper vagina Uterus:  Normal size shape and nontender BME: no palpable adnexal masses.  Parametria smooth bilaterally however uterosacral ligament is shortened on the left.  The cervix is nodular on palpation and there is a 1 cm nodule in the right vaginal fornix. Rectovaginal: deferred    Lab Review   Last CBC Lab Results  Component Value Date   WBC 3.0 (L) 11/21/2021   HGB 14.7 11/21/2021   HCT 40.9 11/21/2021   MCV 93.6 11/21/2021   MCH 33.6 11/21/2021   RDW 11.1 (L) 11/21/2021   PLT 223 11/21/2021      Chemistry      Component Value Date/Time   NA 136 11/21/2021 1246   K 3.8 11/21/2021 1246   CL 104 11/21/2021 1246   CO2 28 11/21/2021 1246   BUN 11 11/21/2021 1246   CREATININE 0.89 11/21/2021 1246      Component Value Date/Time   CALCIUM 9.0 11/21/2021 1246   ALKPHOS 73 11/21/2021 1246   AST 31  11/21/2021 1246   ALT 34 11/21/2021 1246   BILITOT 0.7 11/21/2021 1246         Assessment:  Dawn Camacho is a 32 y.o. 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   Vaginal bleeding possibly secondary to atrophy versus recurrence.  Nodularity of the cervix is slightly concerning.  Baseline exam status post radiation therapy by Dr. Christel Mormon reported a flat cervix.   Abdominal bloating may be secondary to radiation treatment and has improved.  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   Fatigue  HIV testing  Port-a-cath in place  Peripheral neuropathy, mild  Accompanied by mother in law today.   Medical co-morbidities complicating care: anxiety.  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  I suspect this is due to atrophy but I would like to obtain a PET scan to rule out recurrence of disease.   Continue dilator use.  Restart OCPs.  If her PET scan is negative we will start vaginal estrogen.  To workup fatigue will obtain a CBC.  Will also order to HIV testing which appears not to have been obtained at her initial diagnosis.  We recommended calcium and vitamin D supplementation for prevention of osteoporosis.  If she is without evidence of disease for 2 years we can discuss Port-A-Cath removal.  We will continue to follow closely and she will return to clinic in 3 months for surveillance if today's evaluation is negative.  Once we determine that she is stable we can begin alternating visits with Dr. Leafy Ro.  The patient's diagnosis, an outline of the further diagnostic and laboratory studies  which will be required, the recommendation, and alternatives were discussed.  All questions were answered to the patient's satisfaction.    Masha Orbach Gaetana Michaelis, MD

## 2022-03-27 ENCOUNTER — Encounter: Payer: Self-pay | Admitting: Oncology

## 2022-03-27 ENCOUNTER — Inpatient Hospital Stay (HOSPITAL_BASED_OUTPATIENT_CLINIC_OR_DEPARTMENT_OTHER): Payer: BC Managed Care – PPO | Admitting: Oncology

## 2022-03-27 ENCOUNTER — Inpatient Hospital Stay: Payer: BC Managed Care – PPO | Attending: Oncology

## 2022-03-27 VITALS — BP 128/86 | HR 68 | Temp 98.0°F | Resp 16 | Wt 202.6 lb

## 2022-03-27 DIAGNOSIS — R202 Paresthesia of skin: Secondary | ICD-10-CM | POA: Diagnosis not present

## 2022-03-27 DIAGNOSIS — Z79899 Other long term (current) drug therapy: Secondary | ICD-10-CM | POA: Insufficient documentation

## 2022-03-27 DIAGNOSIS — R5383 Other fatigue: Secondary | ICD-10-CM | POA: Insufficient documentation

## 2022-03-27 DIAGNOSIS — C539 Malignant neoplasm of cervix uteri, unspecified: Secondary | ICD-10-CM | POA: Diagnosis not present

## 2022-03-27 DIAGNOSIS — R232 Flushing: Secondary | ICD-10-CM | POA: Insufficient documentation

## 2022-03-27 DIAGNOSIS — Z801 Family history of malignant neoplasm of trachea, bronchus and lung: Secondary | ICD-10-CM | POA: Diagnosis not present

## 2022-03-27 DIAGNOSIS — N930 Postcoital and contact bleeding: Secondary | ICD-10-CM | POA: Diagnosis not present

## 2022-03-27 DIAGNOSIS — Z803 Family history of malignant neoplasm of breast: Secondary | ICD-10-CM | POA: Diagnosis not present

## 2022-03-27 DIAGNOSIS — Z95828 Presence of other vascular implants and grafts: Secondary | ICD-10-CM

## 2022-03-27 DIAGNOSIS — E8940 Asymptomatic postprocedural ovarian failure: Secondary | ICD-10-CM

## 2022-03-27 DIAGNOSIS — G620 Drug-induced polyneuropathy: Secondary | ICD-10-CM | POA: Diagnosis not present

## 2022-03-27 DIAGNOSIS — T451X5A Adverse effect of antineoplastic and immunosuppressive drugs, initial encounter: Secondary | ICD-10-CM | POA: Insufficient documentation

## 2022-03-27 DIAGNOSIS — R87810 Cervical high risk human papillomavirus (HPV) DNA test positive: Secondary | ICD-10-CM | POA: Diagnosis not present

## 2022-03-27 DIAGNOSIS — Z8249 Family history of ischemic heart disease and other diseases of the circulatory system: Secondary | ICD-10-CM | POA: Diagnosis not present

## 2022-03-27 DIAGNOSIS — N200 Calculus of kidney: Secondary | ICD-10-CM | POA: Insufficient documentation

## 2022-03-27 DIAGNOSIS — R200 Anesthesia of skin: Secondary | ICD-10-CM | POA: Diagnosis not present

## 2022-03-27 LAB — CBC WITH DIFFERENTIAL/PLATELET
Abs Immature Granulocytes: 0.01 10*3/uL (ref 0.00–0.07)
Basophils Absolute: 0.1 10*3/uL (ref 0.0–0.1)
Basophils Relative: 1 %
Eosinophils Absolute: 0.2 10*3/uL (ref 0.0–0.5)
Eosinophils Relative: 5 %
HCT: 36.7 % (ref 36.0–46.0)
Hemoglobin: 13.4 g/dL (ref 12.0–15.0)
Immature Granulocytes: 0 %
Lymphocytes Relative: 24 %
Lymphs Abs: 0.9 10*3/uL (ref 0.7–4.0)
MCH: 32.7 pg (ref 26.0–34.0)
MCHC: 36.5 g/dL — ABNORMAL HIGH (ref 30.0–36.0)
MCV: 89.5 fL (ref 80.0–100.0)
Monocytes Absolute: 0.4 10*3/uL (ref 0.1–1.0)
Monocytes Relative: 10 %
Neutro Abs: 2.2 10*3/uL (ref 1.7–7.7)
Neutrophils Relative %: 60 %
Platelets: 226 10*3/uL (ref 150–400)
RBC: 4.1 MIL/uL (ref 3.87–5.11)
RDW: 11.9 % (ref 11.5–15.5)
WBC: 3.7 10*3/uL — ABNORMAL LOW (ref 4.0–10.5)
nRBC: 0 % (ref 0.0–0.2)

## 2022-03-27 LAB — COMPREHENSIVE METABOLIC PANEL
ALT: 24 U/L (ref 0–44)
AST: 22 U/L (ref 15–41)
Albumin: 3.8 g/dL (ref 3.5–5.0)
Alkaline Phosphatase: 71 U/L (ref 38–126)
Anion gap: 6 (ref 5–15)
BUN: 14 mg/dL (ref 6–20)
CO2: 24 mmol/L (ref 22–32)
Calcium: 9.1 mg/dL (ref 8.9–10.3)
Chloride: 107 mmol/L (ref 98–111)
Creatinine, Ser: 0.68 mg/dL (ref 0.44–1.00)
GFR, Estimated: >60 mL/min (ref >60)
Glucose, Bld: 97 mg/dL (ref 70–99)
Potassium: 3.8 mmol/L (ref 3.5–5.1)
Sodium: 137 mmol/L (ref 135–145)
Total Bilirubin: 0.5 mg/dL (ref 0.3–1.2)
Total Protein: 7.1 g/dL (ref 6.5–8.1)

## 2022-03-27 LAB — MAGNESIUM: Magnesium: 2 mg/dL (ref 1.7–2.4)

## 2022-03-27 MED ORDER — HEPARIN SOD (PORK) LOCK FLUSH 100 UNIT/ML IV SOLN
500.0000 [IU] | Freq: Once | INTRAVENOUS | Status: AC
  Administered 2022-03-27: 500 [IU] via INTRAVENOUS
  Filled 2022-03-27: qty 5

## 2022-03-27 MED ORDER — SODIUM CHLORIDE 0.9% FLUSH
10.0000 mL | Freq: Once | INTRAVENOUS | Status: AC
  Administered 2022-03-27: 10 mL via INTRAVENOUS
  Filled 2022-03-27: qty 10

## 2022-03-27 NOTE — Progress Notes (Signed)
Pt in for follow up, was seen by gyn clinic recently and had started back on birth control pill. Pt also is being seen by Luella Cook at [facility] clinic for migraine headaches.

## 2022-03-27 NOTE — Progress Notes (Signed)
Hematology/Oncology Consult note [facility]  [phone] [phone]  Patient Care Team: Pcp, No as PCP - General Patient, No Pcp Per (General Practice) Clent Jacks, RN as Oncology Nurse Navigator Sindy Guadeloupe, MD as Consulting Physician (Oncology) Mellody Drown, MD as Referring Physician (Obstetrics) Christel Mormon Marjory Sneddon, MD as Referring Physician (Radiation Oncology) Noreene Filbert, MD as Consulting Physician (Radiation Oncology)   Name of the patient: Dawn Camacho  [id number]  1990-06-25   Date of visit: 03/27/22  Diagnosis-  cervical cancer HPV positive FIGO stage IIIC1 T2N1aM0 s/p concurrent chemoradiation and vaginal brachytherapy currently in remission  Chief complaint/ Reason for visit-routine follow-up of cervical cancer  Heme/Onc history: Patient is a 32 year old female who was found to have postcoital bleeding that was ongoing for about 2 months.  She was seen by GYN and had a cervical exam which showed a large pink friable vascularized irregular growth in the cervix extending from 11:00 to 5 o'clock position.  Pap showed ASCUS and high risk HPV.  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  She is married and uses NuvaRing for contraception.  She does not have any children yet.  She has had 2 Gardasil vaccinations in the past but did not receive a booster patient was seen by GYN oncology Dr. Fransisca Connors and plan was to obtain a PET CT scan to decide if she would be a candidate for surgery.  PET scan showed hypermetabolic activity of the cervix compatible with primary malignancy.  Hypermetabolic subcentimeter bilateral external iliac lymph nodes concerning for metastatic disease.  Hypermetabolic left inguinal lymph node concerning for additional site of metastatic disease.  Bilateral nonobstructing renal stones.   Plan is to proceed with  definitive concurrent chemoradiation with weekly cisplatin which she stopped on 07/07/2021.  She received vaginal brachytherapy at [facility] in July 2023.  Interval history-overall patient is doing well.  She was started on oral contraceptive pills for hot flashes.  She reports mild tingling numbness in her extremity and ongoing fatigue.  She does notice some vaginal spotting after sex.  ECOG PS- 0 Pain scale- 0   Review of systems- Review of Systems  Constitutional:  Positive for malaise/fatigue. Negative for chills, fever and weight loss.  HENT:  Negative for congestion, ear discharge and nosebleeds.   Eyes:  Negative for blurred vision.  Respiratory:  Negative for cough, hemoptysis, sputum production, shortness of breath and wheezing.   Cardiovascular:  Negative for chest pain, palpitations, orthopnea and claudication.  Gastrointestinal:  Negative for abdominal pain, blood in stool, constipation, diarrhea, heartburn, melena, nausea and vomiting.  Genitourinary:  Negative for dysuria, flank pain, frequency, hematuria and urgency.       Vaginal spotting  Musculoskeletal:  Negative for back pain, joint pain and myalgias.  Skin:  Negative for rash.  Neurological:  Positive for sensory change (Peripheral neuropathy). Negative for dizziness, tingling, focal weakness, seizures, weakness and headaches.  Endo/Heme/Allergies:  Does not bruise/bleed easily.       Hot flashes  Psychiatric/Behavioral:  Negative for depression and suicidal ideas. The patient does not have insomnia.       No Known Allergies   Past Medical History:  Diagnosis Date   Allergic rhinitis    Allergy    Cervical cancer ([place])      Past Surgical History:  Procedure Laterality Date   IR IMAGING GUIDED PORT INSERTION  06/30/2021   TONSILECTOMY, ADENOIDECTOMY, BILATERAL  MYRINGOTOMY AND TUBES Bilateral    TONSILLECTOMY     WISDOM TOOTH EXTRACTION      Social History   Socioeconomic History   Marital status: Married     Spouse name: Not on file   Number of children: Not on file   Years of education: Not on file   Highest education level: Not on file  Occupational History   Not on file  Tobacco Use   Smoking status: Never   Smokeless tobacco: Never  Vaping Use   Vaping Use: Never used  Substance and Sexual Activity   Alcohol use: Yes    Alcohol/week: 1.0 standard drink of alcohol    Types: 1 Glasses of wine per week    Comment: occasional--every couple of weeks.   Drug use: No   Sexual activity: Yes  Other Topics Concern   Not on file  Social History Narrative   Not on file   Social Determinants of Health   Financial Resource Strain: Not on file  Food Insecurity: Not on file  Transportation Needs: Not on file  Physical Activity: Not on file  Stress: Not on file  Social Connections: Not on file  Intimate Partner Violence: Not on file    Family History  Problem Relation Age of Onset   Heart disease Father    Breast cancer Maternal Grandmother    Lung cancer Paternal Grandmother    Heart attack Paternal Grandfather      Current Outpatient Medications:    aspirin-acetaminophen-caffeine (EXCEDRIN MIGRAINE) [phone] MG tablet, Take by mouth every 6 (six) hours as needed for headache., Disp: , Rfl:    hydrOXYzine (ATARAX) 10 MG tablet, SMARTSIG:1-4 Tablet(s) By Mouth Every 6 Hours PRN, Disp: , Rfl:    levonorgestrel-ethinyl estradiol (LYBREL) 90-20 MCG tablet, Take 1 tablet by mouth daily., Disp: 28 tablet, Rfl: 11   lidocaine-prilocaine (EMLA) cream, Apply to affected area once, Disp: 30 g, Rfl: 3   nortriptyline (PAMELOR) 10 MG capsule, Take 10 mg by mouth at bedtime., Disp: , Rfl:    rizatriptan (MAXALT) 10 MG tablet, Take 10 mg by mouth as needed for migraine. May repeat in 2 hours if needed, Disp: , Rfl:  No current facility-administered medications for this visit.  Facility-Administered Medications Ordered in Other Visits:    filgrastim-sndz (ZARXIO) injection 480 mcg, 480 mcg,  Subcutaneous, Once, Berchuck, Andrew, MD   sodium chloride flush (NS) 0.9 % injection 10 mL, 10 mL, Intravenous, PRN, Sindy Guadeloupe, MD, 10 mL at 07/07/21 0841  Physical exam:  Vitals:   03/27/22 1039  BP: 128/86  Pulse: 68  Resp: 16  Temp: 98 F (36.7 C)  TempSrc: Tympanic  Weight: 202 lb 9.6 oz (91.9 kg)   Physical Exam Cardiovascular:     Rate and Rhythm: Normal rate and regular rhythm.     Heart sounds: Normal heart sounds.  Pulmonary:     Effort: Pulmonary effort is normal.     Breath sounds: Normal breath sounds.  Abdominal:     General: Bowel sounds are normal.     Palpations: Abdomen is soft.  Skin:    General: Skin is warm and dry.  Neurological:     Mental Status: She is alert and oriented to person, place, and time.         Latest Ref Rng & Units 03/27/2022    9:56 AM  CMP  Glucose 70 - 99 mg/dL 97   BUN 6 - 20 mg/dL 14   Creatinine 0.44 -  1.00 mg/dL 0.68   Sodium 135 - 145 mmol/L 137   Potassium 3.5 - 5.1 mmol/L 3.8   Chloride 98 - 111 mmol/L 107   CO2 22 - 32 mmol/L 24   Calcium 8.9 - 10.3 mg/dL 9.1   Total Protein 6.5 - 8.1 g/dL 7.1   Total Bilirubin 0.3 - 1.2 mg/dL 0.5   Alkaline Phos 38 - 126 U/L 71   AST 15 - 41 U/L 22   ALT 0 - 44 U/L 24       Latest Ref Rng & Units 03/27/2022    9:56 AM  CBC  WBC 4.0 - 10.5 K/uL 3.7   Hemoglobin 12.0 - 15.0 g/dL 13.4   Hematocrit 36.0 - 46.0 % 36.7   Platelets 150 - 400 K/uL 226     No images are attached to the encounter.  No results found.   Assessment and plan- Patient is a 33 y.o. female stage IIIC HPV positive cervical cancer.  She is s/p concurrent chemoradiation with weekly cisplatin followed by vaginal brachytherapy.  This is a routine follow-up visit  Patient was recently seen by GYN oncology.  At the time of pelvic exam some nodularity was noted in the vaginal vault.  It is possible that this is secondary to radiation changes.  PET CT scan has been ordered for next week.  She has been started  on oral contraceptives for symptoms of hot flashes.  Patient will be seeing GYN and GYN oncology every 3 months.  I am going to see her back in 6 months pending any changes in the PET scan.  Patient has mild leukopenia/neutropenia which is gradually improving since chemotherapy.  Hemoglobin and platelets have now normalized.  Continue to monitor  Chemo-induced peripheral neuropathy: Overall mild.  She is not on any medications for the same.  Continue to monitor.  Medications like gabapentin could be tried which can also help her hot flashes should her symptoms of neuropathy get worse.   Visit Diagnosis 1. Cervical cancer, FIGO stage III ([place])      Dr. Randa Evens, MD, MPH [facility] [phone] 03/27/2022 3:41 PM

## 2022-04-09 ENCOUNTER — Ambulatory Visit
Admission: RE | Admit: 2022-04-09 | Discharge: 2022-04-09 | Disposition: A | Discharge status: Home / Self Care | Payer: BC Managed Care – PPO | Source: Ambulatory Visit | Attending: Nurse Practitioner | Admitting: Nurse Practitioner

## 2022-04-09 DIAGNOSIS — Z923 Personal history of irradiation: Secondary | ICD-10-CM | POA: Diagnosis not present

## 2022-04-09 DIAGNOSIS — Z09 Encounter for follow-up examination after completed treatment for conditions other than malignant neoplasm: Secondary | ICD-10-CM | POA: Insufficient documentation

## 2022-04-09 DIAGNOSIS — Z79630 Long term (current) use of alkylating agent: Secondary | ICD-10-CM | POA: Diagnosis not present

## 2022-04-09 DIAGNOSIS — Z5181 Encounter for therapeutic drug level monitoring: Secondary | ICD-10-CM | POA: Diagnosis not present

## 2022-04-09 DIAGNOSIS — C539 Malignant neoplasm of cervix uteri, unspecified: Secondary | ICD-10-CM | POA: Insufficient documentation

## 2022-04-09 DIAGNOSIS — Z9221 Personal history of antineoplastic chemotherapy: Secondary | ICD-10-CM | POA: Insufficient documentation

## 2022-04-09 DIAGNOSIS — J3489 Other specified disorders of nose and nasal sinuses: Secondary | ICD-10-CM | POA: Insufficient documentation

## 2022-04-09 DIAGNOSIS — C760 Malignant neoplasm of head, face and neck: Secondary | ICD-10-CM | POA: Diagnosis not present

## 2022-04-09 LAB — GLUCOSE, CAPILLARY: Glucose-Capillary: 98 mg/dL (ref 70–99)

## 2022-04-09 MED ORDER — FLUDEOXYGLUCOSE F - 18 (FDG) INJECTION
11.1800 | Freq: Once | INTRAVENOUS | Status: AC | PRN
  Administered 2022-04-09: 11.18 via INTRAVENOUS

## 2022-04-11 DIAGNOSIS — J019 Acute sinusitis, unspecified: Secondary | ICD-10-CM | POA: Diagnosis not present

## 2022-04-11 DIAGNOSIS — Z20822 Contact with and (suspected) exposure to covid-19: Secondary | ICD-10-CM | POA: Diagnosis not present

## 2022-04-29 DIAGNOSIS — R109 Unspecified abdominal pain: Secondary | ICD-10-CM | POA: Diagnosis not present

## 2022-04-29 DIAGNOSIS — K5732 Diverticulitis of large intestine without perforation or abscess without bleeding: Secondary | ICD-10-CM | POA: Diagnosis not present

## 2022-04-29 DIAGNOSIS — E876 Hypokalemia: Secondary | ICD-10-CM | POA: Diagnosis not present

## 2022-04-29 DIAGNOSIS — K5720 Diverticulitis of large intestine with perforation and abscess without bleeding: Secondary | ICD-10-CM | POA: Diagnosis not present

## 2022-04-29 DIAGNOSIS — K631 Perforation of intestine (nontraumatic): Secondary | ICD-10-CM | POA: Diagnosis not present

## 2022-04-29 DIAGNOSIS — Z880 Allergy status to penicillin: Secondary | ICD-10-CM | POA: Diagnosis not present

## 2022-04-29 DIAGNOSIS — D72828 Other elevated white blood cell count: Secondary | ICD-10-CM | POA: Diagnosis not present

## 2022-05-19 ENCOUNTER — Inpatient Hospital Stay: Payer: BC Managed Care – PPO | Attending: Oncology

## 2022-05-19 DIAGNOSIS — C539 Malignant neoplasm of cervix uteri, unspecified: Secondary | ICD-10-CM | POA: Diagnosis not present

## 2022-05-19 DIAGNOSIS — Z95828 Presence of other vascular implants and grafts: Secondary | ICD-10-CM

## 2022-05-19 DIAGNOSIS — Z452 Encounter for adjustment and management of vascular access device: Secondary | ICD-10-CM | POA: Diagnosis not present

## 2022-05-19 MED ORDER — SODIUM CHLORIDE 0.9% FLUSH
10.0000 mL | Freq: Once | INTRAVENOUS | Status: AC
  Administered 2022-05-19: 10 mL via INTRAVENOUS
  Filled 2022-05-19: qty 10

## 2022-05-19 MED ORDER — HEPARIN SOD (PORK) LOCK FLUSH 100 UNIT/ML IV SOLN
500.0000 [IU] | Freq: Once | INTRAVENOUS | Status: AC
  Administered 2022-05-19: 500 [IU] via INTRAVENOUS
  Filled 2022-05-19: qty 5

## 2022-05-21 DIAGNOSIS — K5720 Diverticulitis of large intestine with perforation and abscess without bleeding: Secondary | ICD-10-CM | POA: Diagnosis not present

## 2022-05-22 ENCOUNTER — Ambulatory Visit
Admission: EM | Admit: 2022-05-22 | Discharge: 2022-05-22 | Disposition: A | Discharge status: Home / Self Care | Payer: BC Managed Care – PPO | Attending: Urgent Care | Admitting: Urgent Care

## 2022-05-22 ENCOUNTER — Inpatient Hospital Stay: Payer: BC Managed Care – PPO

## 2022-05-22 DIAGNOSIS — J0100 Acute maxillary sinusitis, unspecified: Secondary | ICD-10-CM | POA: Diagnosis not present

## 2022-05-22 MED ORDER — PREDNISONE 50 MG PO TABS: 50.0000 mg | ORAL_TABLET | Freq: Every day | ORAL | 0 refills | Status: AC

## 2022-05-22 MED ORDER — AZITHROMYCIN 250 MG PO TABS: ORAL_TABLET | ORAL | 0 refills | Status: DC

## 2022-05-22 NOTE — Discharge Instructions (Signed)
Do not start antibiotic unless your symptoms continue after treatment, or recur after completion of treatment.

## 2022-05-22 NOTE — ED Triage Notes (Signed)
Nasal drainage, congestion, productive cough with green mucus, sinus pain and pressure that started 5 days ago. Taking Nyquil, OTC cold and flu medication with no relief of symptoms.

## 2022-05-22 NOTE — ED Provider Notes (Signed)
Roderic Palau    CSN: [id number] Arrival date & time: 05/22/22  [id number]      History   Chief Complaint Chief Complaint  Patient presents with   Nasal Congestion    I believe I have a sinus infection, I'm also losing my voice - Entered by patient   Facial Pain    HPI Dawn Camacho is a 32 y.o. female.   HPI  Presents to urgent care with symptoms of nasal congestion and drainage, productive cough with green sputum, sinus pain and pressure starting 5 days ago.  Using OTC cold/flu medication for symptom control.  Endorses "hoarse voice".  Past Medical History:  Diagnosis Date   Allergic rhinitis    Allergy    Cervical cancer [facility])     Patient Active Problem List   Diagnosis Date Noted   Cervical cancer, FIGO stage III ([place]) 06/24/2021   Cervical cancer ([place]) 06/11/2021   Dyshydrosis 11/21/2015    Past Surgical History:  Procedure Laterality Date   IR IMAGING GUIDED PORT INSERTION  06/30/2021   TONSILECTOMY, ADENOIDECTOMY, BILATERAL MYRINGOTOMY AND TUBES Bilateral    TONSILLECTOMY     WISDOM TOOTH EXTRACTION      OB History   No obstetric history on file.      Home Medications    Prior to Admission medications   Medication Sig Start Date End Date Taking? Authorizing Provider  aspirin-acetaminophen-caffeine (EXCEDRIN MIGRAINE) [phone] MG tablet Take by mouth every 6 (six) hours as needed for headache.   Yes [provider]  hydrOXYzine (ATARAX) 10 MG tablet SMARTSIG:1-4 Tablet(s) By Mouth Every 6 Hours PRN 11/05/21  Yes [provider]  levonorgestrel-ethinyl estradiol (LYBREL) 90-20 MCG tablet Take 1 tablet by mouth daily. 03/25/22  Yes Verlon Au, NP  nortriptyline (PAMELOR) 10 MG capsule Take 10 mg by mouth at bedtime.   Yes [provider]  rizatriptan (MAXALT) 10 MG tablet Take 10 mg by mouth as needed for migraine. May repeat in 2 hours if needed   Yes [provider]  lidocaine-prilocaine (EMLA) cream  Apply to affected area once 06/24/21   Sindy Guadeloupe, MD    Family History Family History  Problem Relation Age of Onset   Heart disease Father    Breast cancer Maternal Grandmother    Lung cancer Paternal Grandmother    Heart attack Paternal Grandfather     Social History Social History   Tobacco Use   Smoking status: Never   Smokeless tobacco: Never  Vaping Use   Vaping Use: Never used  Substance Use Topics   Alcohol use: Yes    Alcohol/week: 1.0 standard drink of alcohol    Types: 1 Glasses of wine per week    Comment: occasional--every couple of weeks.   Drug use: No     Allergies   Patient has no known allergies.   Review of Systems Review of Systems   Physical Exam Triage Vital Signs ED Triage Vitals  Enc Vitals Group     BP 05/22/22 1025 (!) 150/84     Pulse Rate 05/22/22 1025 78     Resp 05/22/22 1025 18     Temp 05/22/22 1025 98 F (36.7 C)     Temp Source 05/22/22 1025 Oral     SpO2 05/22/22 1025 97 %     Weight --      Height --      Head Circumference --      Peak Flow --  Pain Score 05/22/22 1026 7     Pain Loc --      Pain Edu? --      Excl. in [place]? --    No data found.  Updated Vital Signs BP (!) 150/84 (BP Location: Right Arm)   Pulse 78   Temp 98 F (36.7 C) (Oral)   Resp 18   SpO2 97%   Visual Acuity Right Eye Distance:   Left Eye Distance:   Bilateral Distance:    Right Eye Near:   Left Eye Near:    Bilateral Near:     Physical Exam Vitals reviewed.  Constitutional:      Appearance: Normal appearance. She is ill-appearing.  HENT:     Nose:     Right Sinus: Maxillary sinus tenderness present.     Left Sinus: Maxillary sinus tenderness present.  Cardiovascular:     Rate and Rhythm: Normal rate and regular rhythm.     Pulses: Normal pulses.     Heart sounds: Normal heart sounds.  Pulmonary:     Effort: Pulmonary effort is normal.     Breath sounds: Normal breath sounds.  Skin:    General: Skin is warm and  dry.  Neurological:     General: No focal deficit present.     Mental Status: She is alert and oriented to person, place, and time.  Psychiatric:        Mood and Affect: Mood normal.        Behavior: Behavior normal.      UC Treatments / Results  Labs (all labs ordered are listed, but only abnormal results are displayed) Labs Reviewed - No data to display  EKG   Radiology No results found.  Procedures Procedures (including critical care time)  Medications Ordered in UC Medications - No data to display  Initial Impression / Assessment and Plan / UC Course  I have reviewed the triage vital signs and the nursing notes.  Pertinent labs & imaging results that were available during my care of the patient were reviewed by me and considered in my medical decision making (see chart for details).   Patient is afebrile here without recent antipyretics. Satting well on room air. Overall is ill appearing, well hydrated, without respiratory distress. Pulmonary exam is unremarkable.  Lungs CTAB without wheezing, rhonchi, rales.  Maxillary sinus tenderness is present.  Patient's symptoms are consistent with an acute viral process.  Given the severity of her described symptoms, will treat her with a corticosteroid. Prescribed "back-up" antibiotic given her recent sinusitis and possibility of recurrence.  Patient acknowledges understanding and agreement with this treatment plan.  Final Clinical Impressions(s) / UC Diagnoses   Final diagnoses:  None   Discharge Instructions   None    ED Prescriptions   None    PDMP not reviewed this encounter.   Rose Phi, [place] 05/22/22 1047

## 2022-06-03 ENCOUNTER — Other Ambulatory Visit: Payer: Self-pay

## 2022-06-03 DIAGNOSIS — C539 Malignant neoplasm of cervix uteri, unspecified: Secondary | ICD-10-CM

## 2022-06-07 DIAGNOSIS — K5792 Diverticulitis of intestine, part unspecified, without perforation or abscess without bleeding: Secondary | ICD-10-CM | POA: Diagnosis not present

## 2022-06-07 DIAGNOSIS — Z880 Allergy status to penicillin: Secondary | ICD-10-CM | POA: Diagnosis not present

## 2022-06-07 DIAGNOSIS — K5720 Diverticulitis of large intestine with perforation and abscess without bleeding: Secondary | ICD-10-CM | POA: Diagnosis not present

## 2022-06-16 DIAGNOSIS — K5720 Diverticulitis of large intestine with perforation and abscess without bleeding: Secondary | ICD-10-CM | POA: Diagnosis not present

## 2022-06-24 ENCOUNTER — Inpatient Hospital Stay: Payer: Self-pay

## 2022-06-24 ENCOUNTER — Encounter: Payer: Self-pay | Admitting: Obstetrics and Gynecology

## 2022-06-24 ENCOUNTER — Inpatient Hospital Stay: Payer: BC Managed Care – PPO | Attending: Oncology | Admitting: Obstetrics and Gynecology

## 2022-06-24 VITALS — BP 122/79 | HR 70 | Temp 97.8°F | Resp 17 | Wt 203.7 lb

## 2022-06-24 DIAGNOSIS — R140 Abdominal distension (gaseous): Secondary | ICD-10-CM | POA: Insufficient documentation

## 2022-06-24 DIAGNOSIS — Z923 Personal history of irradiation: Secondary | ICD-10-CM | POA: Insufficient documentation

## 2022-06-24 DIAGNOSIS — G629 Polyneuropathy, unspecified: Secondary | ICD-10-CM | POA: Insufficient documentation

## 2022-06-24 DIAGNOSIS — G935 Compression of brain: Secondary | ICD-10-CM | POA: Insufficient documentation

## 2022-06-24 DIAGNOSIS — Z803 Family history of malignant neoplasm of breast: Secondary | ICD-10-CM | POA: Insufficient documentation

## 2022-06-24 DIAGNOSIS — R87810 Cervical high risk human papillomavirus (HPV) DNA test positive: Secondary | ICD-10-CM | POA: Insufficient documentation

## 2022-06-24 DIAGNOSIS — Z793 Long term (current) use of hormonal contraceptives: Secondary | ICD-10-CM | POA: Insufficient documentation

## 2022-06-24 DIAGNOSIS — C539 Malignant neoplasm of cervix uteri, unspecified: Secondary | ICD-10-CM

## 2022-06-24 DIAGNOSIS — C775 Secondary and unspecified malignant neoplasm of intrapelvic lymph nodes: Secondary | ICD-10-CM | POA: Insufficient documentation

## 2022-06-24 DIAGNOSIS — N939 Abnormal uterine and vaginal bleeding, unspecified: Secondary | ICD-10-CM | POA: Insufficient documentation

## 2022-06-24 DIAGNOSIS — Z8249 Family history of ischemic heart disease and other diseases of the circulatory system: Secondary | ICD-10-CM | POA: Insufficient documentation

## 2022-06-24 DIAGNOSIS — F419 Anxiety disorder, unspecified: Secondary | ICD-10-CM | POA: Insufficient documentation

## 2022-06-24 DIAGNOSIS — N200 Calculus of kidney: Secondary | ICD-10-CM | POA: Insufficient documentation

## 2022-06-24 DIAGNOSIS — E28319 Asymptomatic premature menopause: Secondary | ICD-10-CM | POA: Insufficient documentation

## 2022-06-24 DIAGNOSIS — Z79890 Hormone replacement therapy (postmenopausal): Secondary | ICD-10-CM | POA: Insufficient documentation

## 2022-06-24 DIAGNOSIS — Z801 Family history of malignant neoplasm of trachea, bronchus and lung: Secondary | ICD-10-CM | POA: Insufficient documentation

## 2022-06-24 DIAGNOSIS — Z79899 Other long term (current) drug therapy: Secondary | ICD-10-CM | POA: Insufficient documentation

## 2022-06-24 LAB — HIV ANTIBODY (ROUTINE TESTING W REFLEX): HIV Screen 4th Generation wRfx: NONREACTIVE

## 2022-06-24 MED ORDER — NORETHINDRONE ACET-ETHINYL EST 1.5-30 MG-MCG PO TABS: 1.0000 | ORAL_TABLET | Freq: Every day | ORAL | 3 refills | Status: DC

## 2022-06-24 MED ORDER — ACETAMINOPHEN 500 MG PO TABS
1000.0000 mg | ORAL_TABLET | Freq: Once | ORAL | Status: AC | PRN
  Administered 2022-06-24: 1000 mg via ORAL
  Filled 2022-06-24: qty 2

## 2022-06-24 MED ORDER — IBUPROFEN 200 MG PO TABS
800.0000 mg | ORAL_TABLET | Freq: Once | ORAL | Status: AC | PRN
  Administered 2022-06-24: 800 mg via ORAL
  Filled 2022-06-24: qty 4

## 2022-06-24 NOTE — Progress Notes (Signed)
Gynecologic Oncology Interval Visit   Referring Provider: Dr Dalbert Garnet  Chief Concern: cervical cancer, surveillance  Subjective:  Dawn Camacho is a 32 y.o. G0 female who is seen in consultation from Dr Dalbert Garnet for cervical cancer.  Had some bleeding on last visit and PET/CT 04/09/22 negative.  She is having intercourse infrequently, and does have some spotting.  Not bleeding otherwise.  Still using dilator.  OCP Lyrbrel for HRT makes her feel a bit bloated.   She has vaginal spotting whenever she has sex.  Her abdominal bloating has improved over the past few months since she has completed radiation therapy.  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      02/24/2022 Brain MRI IMPRESSION: Borderline Chiari 1 with slight crowding of the craniocervical junction. Otherwise, normal brain MRI.  Gynecologic Oncology History Dawn Camacho is a pleasant y.o. G0 female who is seen in consultation from Dr. Dr Dalbert Garnet for cervical cancer.  05/08/21 seen by Midwife for post coital spotting x 1.5 months. Cervical exam: Large, pink, friable and highly vascularized, non-tender, not smooth, irregular cervical growth extending from 11-5.   PAP ASCUS-H/HPV+. Patient's last menstrual period was 04/29/2021.    Colposcopy confirmed irregular cervical growth extending from 11-5:00. 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   06/12/2021 MRI Pelvis IMPRESSION: 1. 4.2 x 3.7 x 2.9 cm cervical mass involving the anterior aspect of the cervix and also likely involving the anterior aspect of the lower uterine segment. This extends into the vagina mainly on the left side. No MR findings to suggest involvement of the bladder, urethra rectum. 2. Borderline enlarged bilateral pelvic sidewall lymph nodes worrisome for metastatic disease.  06/19/2021 PET scan IMPRESSION: 1. Hypermetabolic activity of the cervix, compatible with primary  malignancy. 2. Hypermetabolic subcentimeter bilateral external iliac lymph nodes, concerning for metastatic disease. 3. Mildly hypermetabolic left inguinal lymph node, concerning for additional site of metastatic disease. 4. Bilateral nonobstructing renal stones.  06/30/2021 Port placement  11/19/2021 PET scan IMPRESSION: 1. Complete metabolic response to therapy of cervical primary and pelvic nodal metastasis. 2. Bilateral nephrolithiasis.  60Gy to cervix and nodes at [facility] under the care of Dr. Rushie Chestnut in 07/10/2021-08/29/2021. T&O at [facility] with Dr. Marinell Blight.  7/10 - 09/15/21 Cervix, T&O/R Total Dose 2400 cGy (each fraction 600 cGy)  Post treatment PET negative.   12/17/2021 Dilator teaching to avoid vaginal stenosis, performed at [facility]   Married and uses Nuva ring for contraception.  Has not had a strong desire for children, but not sure for certain.  Had 2 Gardesil vaccinations, but then last in the series was not done due to recall or some other problem.  Problem List: Patient Active Problem List   Diagnosis Date Noted   Cervical cancer, FIGO stage III ([facility]) 06/24/2021   Cervical cancer ([facility]) 06/11/2021   Dyshydrosis 11/21/2015    Past Medical History: Past Medical History:  Diagnosis Date   Allergic rhinitis    Allergy    Cervical cancer ([facility])     Past Surgical History: Past Surgical History:  Procedure Laterality Date   IR IMAGING GUIDED PORT INSERTION  06/30/2021   TONSILECTOMY, ADENOIDECTOMY, BILATERAL MYRINGOTOMY AND TUBES Bilateral    TONSILLECTOMY     WISDOM TOOTH EXTRACTION          Family History: Family History  Problem Relation Age of Onset   Heart disease Father    Breast cancer Maternal Grandmother    Lung cancer Paternal  Grandmother    Heart attack Paternal Grandfather     Social History: Social History   Socioeconomic History   Marital status: Married    Spouse name: Not on file   Number of children: Not on file   Years of education: Not on  file   Highest education level: Not on file  Occupational History   Not on file  Tobacco Use   Smoking status: Never   Smokeless tobacco: Never  Vaping Use   Vaping Use: Never used  Substance and Sexual Activity   Alcohol use: Yes    Alcohol/week: 1.0 standard drink of alcohol    Types: 1 Glasses of wine per week    Comment: occasional--every couple of weeks.   Drug use: No   Sexual activity: Yes  Other Topics Concern   Not on file  Social History Narrative   Not on file   Social Determinants of Health   Financial Resource Strain: Not on file  Food Insecurity: Not on file  Transportation Needs: Not on file  Physical Activity: Not on file  Stress: Not on file  Social Connections: Not on file  Intimate Partner Violence: Not on file    Allergies: No Known Allergies  Current Medications: Current Outpatient Medications  Medication Sig Dispense Refill   aspirin-acetaminophen-caffeine (EXCEDRIN MIGRAINE) 250-250-65 MG tablet Take by mouth every 6 (six) hours as needed for headache.     lidocaine-prilocaine (EMLA) cream Apply to affected area once 30 g 3   nortriptyline (PAMELOR) 10 MG capsule Take 10 mg by mouth at bedtime.     rizatriptan (MAXALT) 10 MG tablet Take 10 mg by mouth as needed for migraine. May repeat in 2 hours if needed     azithromycin (ZITHROMAX Z-PAK) 250 MG tablet Take 2 tablets (500 mg) today, then 1 tablet (250 mg) for next 4 days. (Patient not taking: Reported on 06/24/2022) 6 tablet 0   hydrOXYzine (ATARAX) 10 MG tablet SMARTSIG:1-4 Tablet(s) By Mouth Every 6 Hours PRN (Patient not taking: Reported on 06/24/2022)     levonorgestrel-ethinyl estradiol (LYBREL) 90-20 MCG tablet Take 1 tablet by mouth daily. 28 tablet 11   No current facility-administered medications for this visit.   Facility-Administered Medications Ordered in Other Visits  Medication Dose Route Frequency Provider Last Rate Last Admin   filgrastim-sndz (ZARXIO) injection 480 mcg  480 mcg  Subcutaneous Once Leida Lauth, MD       sodium chloride flush (NS) 0.9 % injection 10 mL  10 mL Intravenous PRN Creig Hines, MD   10 mL at 07/07/21 0841    Review of Systems General: fatigue  HEENT: no complaints  Lungs: no complaints  Cardiac: no complaints  GI: abdominal bloating  GU: no complaints  GYN: vaginal spotting during sex Musculoskeletal: no complaints  Extremities: no complaints  Skin: no complaints  Neuro: no complaints  Endocrine: no complaints  Psych: numbness and tingling       Objective:  Physical Examination:  BP 122/79   Pulse 70   Temp 97.8 F (36.6 C)   Resp 17   Wt 203 lb 11.2 oz (92.4 kg)   SpO2 95%   BMI 32.88 kg/m    ECOG Performance Status: 0 - Asymptomatic  GENERAL: Patient is a well appearing female in no acute distress HEENT:  Atraumatic and normocephalic. PERRL, neck supple. NODES:  No cervical, supraclavicular, axillary, or inguinal lymphadenopathy palpated.  LUNGS:  Normal respiratory rate ABDOMEN:  Soft, nontender. Nondistended. No masses/ascites/hernia/or hepatomegaly.  EXTREMITIES:  No peripheral edema.   SKIN:  Clear with no obvious rashes or skin changes. No nail dyscrasia. NEURO:  Nonfocal. Well oriented.  Appropriate affect.  Pelvic:exam chaperoned by CMA EGBUS: no lesions Cervix: Some granulation tissue on the cervix that is friable.  Touched this up with silver nitrate stick. Some thickening to right of cervix.  Vagina: no gross lesions, positive bleeding from atrophic sites in the upper vagina Uterus: Normal size shape and nontender BME: no palpable adnexal masses.  Parametria smooth bilaterally however uterosacral ligament is shortened on the left.   Rectovaginal: deferred   Lab Review   Last CBC Lab Results  Component Value Date   WBC 3.7 (L) 03/27/2022   HGB 13.4 03/27/2022   HCT 36.7 03/27/2022   MCV 89.5 03/27/2022   MCH 32.7 03/27/2022   RDW 11.9 03/27/2022   PLT 226 03/27/2022      Chemistry       Component Value Date/Time   NA 137 03/27/2022 0956   K 3.8 03/27/2022 0956   CL 107 03/27/2022 0956   CO2 24 03/27/2022 0956   BUN 14 03/27/2022 0956   CREATININE 0.68 03/27/2022 0956      Component Value Date/Time   CALCIUM 9.1 03/27/2022 0956   ALKPHOS 71 03/27/2022 0956   AST 22 03/27/2022 0956   ALT 24 03/27/2022 0956   BILITOT 0.5 03/27/2022 0956         Assessment:  Dawn Camacho is a 32 y.o. 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   Vaginal bleeding 2/24 and PET/CT negative.  Now just spotting after intercourse.  Cervix with granulation tissue and this treated with silver nitrate stick.  Still using vaginal dilator.   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   Port-a-cath in place and to be removed 2 years post treatment.  Peripheral neuropathy has resolved.   Medical co-morbidities complicating care: anxiety.  Plan:   Problem List Items Addressed This Visit       Genitourinary   Cervical cancer (HCC) - Primary    Will switch to Loestrin OC for HRT to reduce bloating.  Discussed the importance of HRT with menopause at age 70.  We recommended calcium and vitamin D supplementation for prevention of osteoporosis.  PAP done today.  Continue dilator use.   If she is without evidence of disease for 2 years we can discuss Port-A-Cath removal.  We will continue to follow closely and she will return to clinic in 3 months with Dr Rushie Chestnut and 6 months with [place] for surveillance.  The patient's diagnosis, an outline of the further diagnostic and laboratory studies which will be required, the recommendation, and alternatives were discussed.  All questions were answered to the patient's satisfaction.    Leida Lauth, MD

## 2022-06-24 NOTE — Addendum Note (Signed)
Addended by: Alinda Dooms on: 06/24/2022 09:52 AM   Modules accepted: Orders

## 2022-06-30 ENCOUNTER — Encounter: Payer: Self-pay | Admitting: Obstetrics and Gynecology

## 2022-07-03 ENCOUNTER — Encounter: Payer: Self-pay | Admitting: Obstetrics and Gynecology

## 2022-07-06 ENCOUNTER — Encounter: Payer: Self-pay | Admitting: Nurse Practitioner

## 2022-07-13 ENCOUNTER — Other Ambulatory Visit: Payer: Self-pay | Admitting: Nurse Practitioner

## 2022-07-13 MED ORDER — NORETHIN-ETH ESTRAD-FE BIPHAS 1 MG-10 MCG / 10 MCG PO TABS: 1.0000 | ORAL_TABLET | Freq: Every day | ORAL | 11 refills | Status: DC

## 2022-07-13 NOTE — Progress Notes (Signed)
Patient experiencing headache with loestrin. This may be related to excess estradiol. Abdominal symptoms improved with transition to norethindrone however. Plan to rotate to Lo Loestrin 1mg /10 mcg and 10 mcg biphasic pill for hormone replacement. Voicemail regarding change left for patient.

## 2022-07-15 ENCOUNTER — Encounter: Payer: Self-pay | Admitting: Oncology

## 2022-07-15 ENCOUNTER — Ambulatory Visit: Payer: BC Managed Care – PPO | Admitting: Radiation Oncology

## 2022-07-22 ENCOUNTER — Other Ambulatory Visit: Payer: Self-pay | Admitting: Nurse Practitioner

## 2022-07-22 ENCOUNTER — Inpatient Hospital Stay: Payer: Self-pay

## 2022-07-22 ENCOUNTER — Encounter: Payer: Self-pay | Admitting: Obstetrics and Gynecology

## 2022-07-22 ENCOUNTER — Inpatient Hospital Stay (HOSPITAL_BASED_OUTPATIENT_CLINIC_OR_DEPARTMENT_OTHER): Payer: Self-pay | Admitting: Obstetrics and Gynecology

## 2022-07-22 VITALS — BP 127/102 | HR 72 | Temp 97.6°F | Resp 20 | Wt 206.0 lb

## 2022-07-22 DIAGNOSIS — E8940 Asymptomatic postprocedural ovarian failure: Secondary | ICD-10-CM | POA: Insufficient documentation

## 2022-07-22 DIAGNOSIS — C539 Malignant neoplasm of cervix uteri, unspecified: Secondary | ICD-10-CM

## 2022-07-22 DIAGNOSIS — Z95828 Presence of other vascular implants and grafts: Secondary | ICD-10-CM

## 2022-07-22 DIAGNOSIS — R87810 Cervical high risk human papillomavirus (HPV) DNA test positive: Secondary | ICD-10-CM | POA: Insufficient documentation

## 2022-07-22 LAB — COMPREHENSIVE METABOLIC PANEL
ALT: 23 U/L (ref 0–44)
AST: 28 U/L (ref 15–41)
Albumin: 3.9 g/dL (ref 3.5–5.0)
Alkaline Phosphatase: 75 U/L (ref 38–126)
Anion gap: 9 (ref 5–15)
BUN: 10 mg/dL (ref 6–20)
CO2: 23 mmol/L (ref 22–32)
Calcium: 8.9 mg/dL (ref 8.9–10.3)
Chloride: 105 mmol/L (ref 98–111)
Creatinine, Ser: 0.86 mg/dL (ref 0.44–1.00)
GFR, Estimated: >60 mL/min (ref >60)
Glucose, Bld: 97 mg/dL (ref 70–99)
Potassium: 3.7 mmol/L (ref 3.5–5.1)
Sodium: 137 mmol/L (ref 135–145)
Total Bilirubin: 0.6 mg/dL (ref 0.3–1.2)
Total Protein: 7.1 g/dL (ref 6.5–8.1)

## 2022-07-22 LAB — CBC WITH DIFFERENTIAL/PLATELET
Abs Immature Granulocytes: 0.01 10*3/uL (ref 0.00–0.07)
Basophils Absolute: 0 10*3/uL (ref 0.0–0.1)
Basophils Relative: 1 %
Eosinophils Absolute: 0.2 10*3/uL (ref 0.0–0.5)
Eosinophils Relative: 6 %
HCT: 38.7 % (ref 36.0–46.0)
Hemoglobin: 14 g/dL (ref 12.0–15.0)
Immature Granulocytes: 0 %
Lymphocytes Relative: 33 %
Lymphs Abs: 1.2 10*3/uL (ref 0.7–4.0)
MCH: 32.9 pg (ref 26.0–34.0)
MCHC: 36.2 g/dL — ABNORMAL HIGH (ref 30.0–36.0)
MCV: 91.1 fL (ref 80.0–100.0)
Monocytes Absolute: 0.3 10*3/uL (ref 0.1–1.0)
Monocytes Relative: 8 %
Neutro Abs: 1.9 10*3/uL (ref 1.7–7.7)
Neutrophils Relative %: 52 %
Platelets: 242 10*3/uL (ref 150–400)
RBC: 4.25 MIL/uL (ref 3.87–5.11)
RDW: 11.9 % (ref 11.5–15.5)
WBC: 3.6 10*3/uL — ABNORMAL LOW (ref 4.0–10.5)
nRBC: 0 % (ref 0.0–0.2)

## 2022-07-22 MED ORDER — SODIUM CHLORIDE 0.9% FLUSH
10.0000 mL | Freq: Once | INTRAVENOUS | Status: AC
  Administered 2022-07-22: 10 mL via INTRAVENOUS
  Filled 2022-07-22: qty 10

## 2022-07-22 MED ORDER — HEPARIN SOD (PORK) LOCK FLUSH 100 UNIT/ML IV SOLN
500.0000 [IU] | Freq: Once | INTRAVENOUS | Status: AC
  Administered 2022-07-22: 500 [IU] via INTRAVENOUS
  Filled 2022-07-22: qty 5

## 2022-07-22 NOTE — Progress Notes (Signed)
Survivorship Care Plan visit completed.  Treatment summary reviewed and given to patient.  ASCO answers booklet reviewed and given to patient.  CARE program and Cancer Transitions discussed with patient along with other resources cancer center offers to patients and caregivers.  Patient verbalized understanding.    

## 2022-07-22 NOTE — Progress Notes (Signed)
Gynecologic Oncology Interval Visit   Referring Provider: Dr Dalbert Garnet  Chief Concern: cervical cancer surveillance  Subjective:  Dawn Camacho is a 32 y.o. G0 female who is seen in consultation from Dr Dalbert Garnet for cervical cancer.  She was seen in clinic on 06/24/22 for surveillance. Pap was obtained but was insufficient for testing. HPV was positive. Dr Johnnette Litter recommended recollection. Additionally, at last visit she had post coital spotting. Granulation tissue on cervix was observed and thought to be etiology. She was treated with silver nitrate.   HRT was rotated d/t bloating. She then experienced worsening headaches and has been rotated to Lo Loestrin Fe. Hasn't yet received prescription. No spotting. Ongoing abdominal bloating but gradually improving.     Gynecologic Oncology History Dawn Camacho is a pleasant y.o. G0 female who is seen in consultation from Dr. Dr Dalbert Garnet for cervical cancer.  05/08/21 seen by Midwife for post coital spotting x 1.5 months. Cervical exam: Large, pink, friable and highly vascularized, non-tender, not smooth, irregular cervical growth extending from 11-5.   PAP ASCUS-H/HPV+. Patient's last menstrual period was 04/29/2021.    Colposcopy confirmed irregular cervical growth extending from 11-5:00. 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   06/12/2021 MRI Pelvis IMPRESSION: 1. 4.2 x 3.7 x 2.9 cm cervical mass involving the anterior aspect of the cervix and also likely involving the anterior aspect of the lower uterine segment. This extends into the vagina mainly on the left side. No MR findings to suggest involvement of the bladder, urethra rectum. 2. Borderline enlarged bilateral pelvic sidewall lymph nodes worrisome for metastatic disease.  06/19/2021 PET scan IMPRESSION: 1. Hypermetabolic activity of the cervix, compatible with primary malignancy. 2. Hypermetabolic subcentimeter bilateral external iliac  lymph nodes, concerning for metastatic disease. 3. Mildly hypermetabolic left inguinal lymph node, concerning for additional site of metastatic disease. 4. Bilateral nonobstructing renal stones.  06/30/2021 Port placement  11/19/2021 PET scan IMPRESSION: 1. Complete metabolic response to therapy of cervical primary and pelvic nodal metastasis. 2. Bilateral nephrolithiasis.  60Gy to cervix and nodes at [facility] under the care of Dr. Rushie Chestnut in 07/10/2021-08/29/2021. T&O at [facility] with Dr. Marinell Blight.  7/10 - 09/15/21 Cervix, T&O/R Total Dose 2400 cGy (each fraction 600 cGy)  Post treatment PET 04/09/22 was negative.   12/17/2021 Dilator teaching to avoid vaginal stenosis, performed at [facility].   Married and used Paediatric nurse for contraception.  Has not had a strong desire for children, but not sure for certain.   Had 2 Gardesil vaccinations, but then last in the series was not done due to recall or some other problem.  06/24/22- HIV negative      02/24/2022 Brain MRI performed by Neurology for history of headaches IMPRESSION: Borderline Chiari 1 with slight crowding of the craniocervical junction. Otherwise, normal brain MRI.    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 07/22/2022   Cervical cancer, FIGO stage III (HCC) 06/24/2021   Cervical cancer (HCC) 06/11/2021   Dyshydrosis 11/21/2015    Past Medical History: Past Medical History:  Diagnosis Date   Allergic rhinitis    Allergy    Cervical cancer (HCC)     Past Surgical History: Past Surgical History:  Procedure Laterality Date   IR IMAGING GUIDED PORT INSERTION  06/30/2021   TONSILECTOMY, ADENOIDECTOMY, BILATERAL MYRINGOTOMY AND TUBES Bilateral    TONSILLECTOMY  WISDOM TOOTH EXTRACTION       Family History: Family History  Problem Relation Age of Onset   Heart disease Father    Breast cancer  Maternal Grandmother    Lung cancer Paternal Grandmother    Heart attack Paternal Grandfather     Social History: Social History   Socioeconomic History   Marital status: Married    Spouse name: Not on file   Number of children: Not on file   Years of education: Not on file   Highest education level: Not on file  Occupational History   Not on file  Tobacco Use   Smoking status: Never   Smokeless tobacco: Never  Vaping Use   Vaping Use: Never used  Substance and Sexual Activity   Alcohol use: Yes    Alcohol/week: 1.0 standard drink of alcohol    Types: 1 Glasses of wine per week    Comment: occasional--every couple of weeks.   Drug use: No   Sexual activity: Yes  Other Topics Concern   Not on file  Social History Narrative   Not on file   Social Determinants of Health   Financial Resource Strain: Not on file  Food Insecurity: Not on file  Transportation Needs: Not on file  Physical Activity: Not on file  Stress: Not on file  Social Connections: Not on file  Intimate Partner Violence: Not on file    Allergies: No Known Allergies  Current Medications: Current Outpatient Medications  Medication Sig Dispense Refill   aspirin-acetaminophen-caffeine (EXCEDRIN MIGRAINE) 250-250-65 MG tablet Take by mouth every 6 (six) hours as needed for headache.     lidocaine-prilocaine (EMLA) cream Apply to affected area once 30 g 3   Norethindrone-Ethinyl Estradiol-Fe Biphas (LO LOESTRIN FE) 1 MG-10 MCG / 10 MCG tablet Take 1 tablet by mouth daily. Take pills continuously for 28 day cycle for hormonal replacement 28 tablet 11   nortriptyline (PAMELOR) 10 MG capsule Take 10 mg by mouth at bedtime.     rizatriptan (MAXALT) 10 MG tablet Take 10 mg by mouth as needed for migraine. May repeat in 2 hours if needed     azithromycin (ZITHROMAX Z-PAK) 250 MG tablet Take 2 tablets (500 mg) today, then 1 tablet (250 mg) for next 4 days. (Patient not taking: Reported on 06/24/2022) 6 tablet 0    hydrOXYzine (ATARAX) 10 MG tablet SMARTSIG:1-4 Tablet(s) By Mouth Every 6 Hours PRN (Patient not taking: Reported on 06/24/2022)     No current facility-administered medications for this visit.   Facility-Administered Medications Ordered in Other Visits  Medication Dose Route Frequency Provider Last Rate Last Admin   sodium chloride flush (NS) 0.9 % injection 10 mL  10 mL Intravenous PRN Creig Hines, MD   10 mL at 07/07/21 0841    Review of Systems General:  no complaints Skin: no complaints Eyes: no complaints HEENT: no complaints Breasts: no complaints Pulmonary: no complaints Cardiac: no complaints Gastrointestinal: bloating Genitourinary/Sexual: spotting Ob/Gyn: no complaints Musculoskeletal: no complaints Hematology: no complaints Neurologic/Psych: headaches     Objective:  Physical Examination:  BP (!) 127/102   Pulse 72   Temp 97.6 F (36.4 C)   Resp 20   Wt 206 lb (93.4 kg)   SpO2 100%   BMI 33.25 kg/m    ECOG Performance Status: 0 - Asymptomatic  GENERAL: Patient is a well appearing female in no acute distress. Accompanied by step mom Beverly HEENT:  Sclera clear. Anicteric LUNGS:  no audible wheezing or  coughing ABDOMEN:  Soft, nontender.  EXTREMITIES:  No peripheral edema.  SKIN:  Clear with no obvious rashes or skin changes.  NEURO:  Nonfocal. Well oriented.  Appropriate affect.  Pelvic:exam chaperoned by NP EGBUS: no lesions Cervix: deviated to left; os seen. Pap obtained.  Vagina: no gross lesions, positive bleeding from atrophic sites in the upper vagina by cervix BME: deferred   Lab Review No labs on site today.    Assessment:  Dawn Camacho is a 32 y.o. 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   Vaginal bleeding 2/24 and PET/CT negative.    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   Medical co-morbidities complicating care: anxiety.  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 She will continue to rotate visits including pelvic exam with Dr. Rushie Chestnut in 3 months and gyn-onc in 6 months. Should she develop concerning symptoms, we can see her sooner.   HRT- Started levonorgestrel-ethinyl estradiol/Lybrel 90-20 mcg but she experienced increased bloating. Switched to norethindrone acetate-ethinyl estradiol/Loestrin 1.5/30 mcg. She had increased headaches. We've now rotated to northethindrone-ethinyl estradiol/Lo Loestrin Fe 1/10 mcg tablet since 07/13/22. She will avoid all tobacco given increase risk of VTE with use of estrogen.   Bone Health- she will continue calcium 1200 mg and vitamin D 1000 iu daily for prevention of bone loss. Encouraged weight bearing exercise life long.   Pap- recollected today  Vaginal Health- post radiation, she is at risk of vaginal atrophy and agglutination. Reviewed importance of keeping vagina open for surveillance of malignancy and sexual health. She will continue dilator use. May require use of vaginal lubricants.   IV access: port a cath was placed for administration of chemotherapy. Can consider removal at any time.   Cancer screenings and wellness- she will continue to see her pcp for cancer screenings and ongoing wellness.   The patient's diagnosis, an outline of the further diagnostic and laboratory studies which will be required, the recommendation, and alternatives were discussed.   All questions were answered to the patient's satisfaction.  Consuello Masse, DNP, AGNP-C, AOCNP [facility] [phone] (clinic)  I personally interviewed and examined the patient. Agreed with the above/below plan of care. Patient/family questions were answered.  Britteney Ayotte Leta Jungling, MD

## 2022-07-30 LAB — IGP, APTIMA HPV: HPV Aptima: POSITIVE — AB

## 2022-07-31 ENCOUNTER — Encounter: Payer: Self-pay | Admitting: Obstetrics and Gynecology

## 2022-08-05 ENCOUNTER — Inpatient Hospital Stay: Payer: BC Managed Care – PPO

## 2022-08-05 ENCOUNTER — Inpatient Hospital Stay: Payer: BC Managed Care – PPO | Attending: Oncology | Admitting: Obstetrics and Gynecology

## 2022-08-05 VITALS — BP 125/92 | HR 78 | Temp 96.6°F | Resp 20 | Wt 205.8 lb

## 2022-08-05 DIAGNOSIS — Z923 Personal history of irradiation: Secondary | ICD-10-CM | POA: Diagnosis not present

## 2022-08-05 DIAGNOSIS — G935 Compression of brain: Secondary | ICD-10-CM | POA: Diagnosis not present

## 2022-08-05 DIAGNOSIS — R52 Pain, unspecified: Secondary | ICD-10-CM

## 2022-08-05 DIAGNOSIS — E28319 Asymptomatic premature menopause: Secondary | ICD-10-CM | POA: Diagnosis not present

## 2022-08-05 DIAGNOSIS — Z8541 Personal history of malignant neoplasm of cervix uteri: Secondary | ICD-10-CM | POA: Diagnosis not present

## 2022-08-05 DIAGNOSIS — Z08 Encounter for follow-up examination after completed treatment for malignant neoplasm: Secondary | ICD-10-CM | POA: Insufficient documentation

## 2022-08-05 DIAGNOSIS — C539 Malignant neoplasm of cervix uteri, unspecified: Secondary | ICD-10-CM | POA: Diagnosis not present

## 2022-08-05 DIAGNOSIS — R87618 Other abnormal cytological findings on specimens from cervix uteri: Secondary | ICD-10-CM | POA: Insufficient documentation

## 2022-08-05 DIAGNOSIS — R87611 Atypical squamous cells cannot exclude high grade squamous intraepithelial lesion on cytologic smear of cervix (ASC-H): Secondary | ICD-10-CM | POA: Diagnosis not present

## 2022-08-05 DIAGNOSIS — Z79890 Hormone replacement therapy (postmenopausal): Secondary | ICD-10-CM | POA: Diagnosis not present

## 2022-08-05 MED ORDER — ACETAMINOPHEN 325 MG PO TABS
ORAL_TABLET | ORAL | Status: AC
  Filled 2022-08-05: qty 2

## 2022-08-05 MED ORDER — ACETAMINOPHEN 325 MG PO TABS
650.0000 mg | ORAL_TABLET | Freq: Once | ORAL | Status: AC
  Administered 2022-08-05: 650 mg via ORAL

## 2022-08-05 NOTE — Progress Notes (Signed)
Gynecologic Oncology Interval Visit   Referring Provider: Dr Dalbert Garnet  Chief Concern: cervical cancer surveillance  Subjective:  Dawn Camacho is a 32 y.o. G0 female who is seen in consultation from Dr Dalbert Garnet for cervical cancer.  She was seen in clinic on 06/24/22 for surveillance. Pap was obtained but was insufficient for testing. Additionally, she had post coital spotting. Granulation tissue on cervix was observed and thought to be etiology. She was treated with silver nitrate. HPV was positive. It was recollected on 07/22/22 and reported as EPCA, ASCHB, HR HPV Positive.   She presents today for colposcopy.   HRT was rotated d/t bloating. She then experienced worsening headaches and has been rotated to Lo Loestrin Fe.     Gynecologic Oncology History Dawn Camacho is a pleasant y.o. G0 female who is seen in consultation from Dr. Dr Dalbert Garnet for cervical cancer.  05/08/21 seen by Midwife for post coital spotting x 1.5 months. Cervical exam: Large, pink, friable and highly vascularized, non-tender, not smooth, irregular cervical growth extending from 11-5.   PAP ASCUS-H/HPV+. Patient's last menstrual period was 04/29/2021.    Colposcopy confirmed irregular cervical growth extending from 11-5:00. 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   06/12/2021 MRI Pelvis IMPRESSION: 1. 4.2 x 3.7 x 2.9 cm cervical mass involving the anterior aspect of the cervix and also likely involving the anterior aspect of the lower uterine segment. This extends into the vagina mainly on the left side. No MR findings to suggest involvement of the bladder, urethra rectum. 2. Borderline enlarged bilateral pelvic sidewall lymph nodes worrisome for metastatic disease.  06/19/2021 PET scan IMPRESSION: 1. Hypermetabolic activity of the cervix, compatible with primary malignancy. 2. Hypermetabolic subcentimeter bilateral external iliac lymph nodes, concerning for metastatic  disease. 3. Mildly hypermetabolic left inguinal lymph node, concerning for additional site of metastatic disease. 4. Bilateral nonobstructing renal stones.  06/30/2021 Port placement  11/19/2021 PET scan IMPRESSION: 1. Complete metabolic response to therapy of cervical primary and pelvic nodal metastasis. 2. Bilateral nephrolithiasis.  60Gy to cervix and nodes at [facility] under the care of Dr. Rushie Chestnut in 07/10/2021-08/29/2021. T&O at [facility] with Dr. Marinell Blight.  7/10 - 09/15/21 Cervix, T&O/R Total Dose 2400 cGy (each fraction 600 cGy)  Post treatment PET 04/09/22 was negative.   12/17/2021 Dilator teaching to avoid vaginal stenosis, performed at [facility].   Married and used Paediatric nurse for contraception.  Has not had a strong desire for children, but not sure for certain.   Had 2 Gardesil vaccinations, but then last in the series was not done due to recall or some other problem.  06/24/22- HIV negative      02/24/2022 Brain MRI performed by Neurology for history of headaches IMPRESSION: Borderline Chiari 1 with slight crowding of the craniocervical junction. Otherwise, normal brain MRI.    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 07/22/2022   Cervical cancer, FIGO stage III (HCC) 06/24/2021   Cervical cancer (HCC) 06/11/2021   Dyshydrosis 11/21/2015    Past Medical History: Past Medical History:  Diagnosis Date   Allergic rhinitis    Allergy    Cervical cancer (HCC)     Past Surgical History: Past Surgical History:  Procedure Laterality Date   IR IMAGING  GUIDED PORT INSERTION  06/30/2021   TONSILECTOMY, ADENOIDECTOMY, BILATERAL MYRINGOTOMY AND TUBES Bilateral    TONSILLECTOMY     WISDOM TOOTH EXTRACTION       Family  History: Family History  Problem Relation Age of Onset   Heart disease Father    Breast cancer Maternal Grandmother    Lung cancer Paternal Grandmother    Heart attack Paternal Grandfather     Social History: Social History   Socioeconomic History   Marital status: Married    Spouse name: Not on file   Number of children: Not on file   Years of education: Not on file   Highest education level: Not on file  Occupational History   Not on file  Tobacco Use   Smoking status: Never   Smokeless tobacco: Never  Vaping Use   Vaping Use: Never used  Substance and Sexual Activity   Alcohol use: Yes    Alcohol/week: 1.0 standard drink of alcohol    Types: 1 Glasses of wine per week    Comment: occasional--every couple of weeks.   Drug use: No   Sexual activity: Yes  Other Topics Concern   Not on file  Social History Narrative   Not on file   Social Determinants of Health   Financial Resource Strain: Not on file  Food Insecurity: Not on file  Transportation Needs: Not on file  Physical Activity: Not on file  Stress: Not on file  Social Connections: Not on file  Intimate Partner Violence: Not on file    Allergies: No Known Allergies  Current Medications: Current Outpatient Medications  Medication Sig Dispense Refill   aspirin-acetaminophen-caffeine (EXCEDRIN MIGRAINE) 250-250-65 MG tablet Take by mouth every 6 (six) hours as needed for headache.     lidocaine-prilocaine (EMLA) cream Apply to affected area once 30 g 3   Norethindrone-Ethinyl Estradiol-Fe Biphas (LO LOESTRIN FE) 1 MG-10 MCG / 10 MCG tablet Take 1 tablet by mouth daily. Take pills continuously for 28 day cycle for hormonal replacement 28 tablet 11   nortriptyline (PAMELOR) 10 MG capsule Take 10 mg by mouth at bedtime.     rizatriptan (MAXALT) 10 MG tablet Take 10 mg by mouth as needed for migraine. May repeat in 2 hours if needed     azithromycin (ZITHROMAX Z-PAK) 250 MG tablet Take 2 tablets (500 mg)  today, then 1 tablet (250 mg) for next 4 days. (Patient not taking: Reported on 06/24/2022) 6 tablet 0   hydrOXYzine (ATARAX) 10 MG tablet SMARTSIG:1-4 Tablet(s) By Mouth Every 6 Hours PRN (Patient not taking: Reported on 06/24/2022)     No current facility-administered medications for this visit.   Facility-Administered Medications Ordered in Other Visits  Medication Dose Route Frequency Provider Last Rate Last Admin   acetaminophen (TYLENOL) tablet 650 mg  650 mg Oral Once Garner, Camacho Riser, MD       sodium chloride flush (NS) 0.9 % injection 10 mL  10 mL Intravenous PRN Creig Hines, MD   10 mL at 07/07/21 0841   Review of Systems General:  no complaints Skin: no complaints Eyes: no complaints HEENT: no complaints Breasts: no complaints Pulmonary: no complaints Cardiac: no complaints Gastrointestinal: no complaints Genitourinary/Sexual: no complaints Ob/Gyn: no complaints Musculoskeletal: no complaints Hematology: no complaints Neurologic/Psych: no complaints   Objective:  Physical Examination:  BP (!) 125/92   Pulse 78   Temp (!) 96.6 F (35.9 C)   Resp 20   Wt 205 lb 12.8 oz (93.4 kg)  SpO2 100%   BMI 33.22 kg/m    ECOG Performance Status: 0 - Asymptomatic  GENERAL: Patient is a well appearing female in no acute distress. Accompanied by step mom Beverly  EXTREMITIES:  No peripheral edema.  NEURO:  Nonfocal. Well oriented.  Appropriate affect.  Pelvic:exam chaperoned by NP EGBUS: no lesions Cervix: deviated to left; os seen. Fibrotic white tissue in the os. See procedure note Vagina: no gross lesions, positive bleeding from atrophic sites in the upper vagina by cervix BME: deferred  PROCEDURE: The risks and benefits of the procedure were reviewed and informed consent obtained. Time out was performed. The patient received pre-procedure teaching and expressed understanding. The post-procedure instructions were reviewed with the patient and she expressed  understanding. The patient does not have any barriers to learning.  Colposcopy of the upper vagina and cervix was performed after application os acetic acid. The findings revealed cervical erythema and most prominent at 5-6 o'clock. A slightly raised friable area on the left vagina around 5 o'clock by the cervix. WE lateral left wall. The transformation zone was/was not seen.  Biopsy was performed at 6 o'clock cervix. She was unable to tolerate remaining procedure. Would have liked to obtain ECC and posterior left vaginal biopsy. Procedure completed without complications. Hemostasis adequate with Monsel's solution. The patient had difficulty tolerating the procedure well.   Post-procedure evaluation the patient was stable without complaints.   Lab Review No labs on site today  Assessment:  Dawn Camacho is a 32 y.o. 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   Vaginal bleeding 2/24 and PET/CT negative.   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   Medical co-morbidities complicating care: anxiety.  Plan:   Problem List Items Addressed This Visit       Genitourinary   Cervical cancer, FIGO stage III (HCC) - Primary   Relevant Orders   Surgical pathology     Other   Atypical squamous cells cannot exclude high grade squamous intraepithelial lesion on cytologic smear of cervix (ASC-H)   Other Visit Diagnoses     Pap smear abnormality of cervix/human papillomavirus (HPV) positive           Cervical Cancer Surveillance- we reviewed NCCN guidelines for cervical cancer surveillance. Given abnormal Pap plan for follow up in Gyn Onc clinic in 3 months.   Follow up biopsy results. We may need to obtain  additional biopsies and I am not certain if she will be able to tolerate in the clinic.   HRT- Started levonorgestrel-ethinyl estradiol/Lybrel 90-20 mcg but she experienced increased bloating. Switched to norethindrone acetate-ethinyl estradiol/Loestrin 1.5/30 mcg. She had increased headaches. We've now rotated to northethindrone-ethinyl estradiol/Lo Loestrin Fe 1/10 mcg tablet since 07/13/22. She will avoid all tobacco given increase risk of VTE with use of estrogen.   Bone Health-  calcium 1200 mg and vitamin D 1000 iu daily for prevention of bone loss. Encouraged weight bearing exercise life long.   Vaginal Health- post radiation, she is at risk of vaginal atrophy and agglutination. We previously eviewed importance of keeping vagina open for surveillance of malignancy and sexual health. She will continue dilator use. May require use of vaginal lubricants.   IV access: port a cath was placed for  administration of chemotherapy. Can consider removal at any time.   Cancer screenings and wellness- she will continue to see her pcp for cancer screenings and ongoing wellness.   The patient's diagnosis, an outline of the further diagnostic and laboratory studies which will be required, the recommendation, and alternatives were discussed.  All questions were answered to the patient's satisfaction.  Everrett Lacasse Leta Jungling, MD

## 2022-08-07 LAB — SURGICAL PATHOLOGY

## 2022-08-10 ENCOUNTER — Encounter: Payer: Self-pay | Admitting: Obstetrics and Gynecology

## 2022-08-21 ENCOUNTER — Other Ambulatory Visit: Payer: Self-pay | Admitting: Oncology

## 2022-08-21 DIAGNOSIS — C539 Malignant neoplasm of cervix uteri, unspecified: Secondary | ICD-10-CM

## 2022-08-24 ENCOUNTER — Encounter: Payer: Self-pay | Admitting: Oncology

## 2022-08-24 ENCOUNTER — Other Ambulatory Visit: Payer: Self-pay | Admitting: *Deleted

## 2022-09-08 ENCOUNTER — Telehealth: Payer: Self-pay

## 2022-09-08 NOTE — Telephone Encounter (Signed)
Voicemail left with Ms. Gato to return call. Would like to review pathology from previous biopsy and arrange an appointment.

## 2022-09-18 ENCOUNTER — Telehealth: Payer: Self-pay | Admitting: *Deleted

## 2022-09-18 ENCOUNTER — Other Ambulatory Visit: Payer: Self-pay | Admitting: Nurse Practitioner

## 2022-09-18 MED ORDER — LIDOCAINE 5 % EX OINT: 1.0000 | TOPICAL_OINTMENT | CUTANEOUS | 0 refills | Status: DC | PRN

## 2022-09-18 NOTE — Telephone Encounter (Signed)
Patient stated that she comes for a biopsy on Wednesday and was supposed to have a prescription called into her pharmacy prior to this apt. She stated that everyt ime she comes in for a biopsy, this is a quite painful experience. Please advise.

## 2022-09-21 ENCOUNTER — Inpatient Hospital Stay: Payer: BC Managed Care – PPO

## 2022-09-21 ENCOUNTER — Encounter: Payer: Self-pay | Admitting: Obstetrics and Gynecology

## 2022-09-22 ENCOUNTER — Other Ambulatory Visit: Payer: Self-pay | Admitting: Nurse Practitioner

## 2022-09-22 ENCOUNTER — Telehealth: Payer: Self-pay | Admitting: Nurse Practitioner

## 2022-09-22 MED ORDER — LIDOCAINE 5 % EX OINT: 1.0000 | TOPICAL_OINTMENT | CUTANEOUS | 0 refills | Status: AC | PRN

## 2022-09-22 NOTE — Progress Notes (Signed)
Lidocaine not covered by insurance. Advised of [employer] vs [employer]. Prescription sent to [facility] at patient request.

## 2022-09-22 NOTE — Telephone Encounter (Signed)
Called patient to review plan for intravaginal lidocaine for ECC tomorrow. Prescription was sent to pharmacy last week. She will just need to bring medication to appointment and we'll insert prior to procedure to allow for improved pain control. Also recommend premedication with tylenol and ibuprofen.   No answer. Left voicemail.

## 2022-09-23 ENCOUNTER — Inpatient Hospital Stay: Payer: BC Managed Care – PPO | Attending: Oncology | Admitting: Obstetrics and Gynecology

## 2022-09-23 VITALS — BP 122/79 | HR 73 | Temp 98.6°F | Resp 20 | Wt 203.1 lb

## 2022-09-23 DIAGNOSIS — Z923 Personal history of irradiation: Secondary | ICD-10-CM | POA: Diagnosis not present

## 2022-09-23 DIAGNOSIS — C539 Malignant neoplasm of cervix uteri, unspecified: Secondary | ICD-10-CM

## 2022-09-23 DIAGNOSIS — Z08 Encounter for follow-up examination after completed treatment for malignant neoplasm: Secondary | ICD-10-CM | POA: Insufficient documentation

## 2022-09-23 DIAGNOSIS — Z8541 Personal history of malignant neoplasm of cervix uteri: Secondary | ICD-10-CM | POA: Diagnosis not present

## 2022-09-23 DIAGNOSIS — Z79890 Hormone replacement therapy (postmenopausal): Secondary | ICD-10-CM | POA: Diagnosis not present

## 2022-09-23 NOTE — Progress Notes (Signed)
Gynecologic Oncology Interval Visit   Referring Provider: Dr Dalbert Garnet  Chief Concern: cervical cancer visit for biopsy  Subjective:  Dawn Camacho is a 32 y.o. G0 female who is seen in consultation from Dr Dalbert Garnet for cervical cancer.  She was seen in clinic on 06/24/22 for surveillance. Pap was obtained but was insufficient for testing. Additionally, she had post coital spotting. Granulation tissue on cervix was observed and thought to be etiology. She was treated with silver nitrate. HPV was positive. It was recollected on 07/22/22 and reported as EPCA, ASCUS-H, HR HPV Positive.   08/05/22 colposcopy and biopsy with Dr Sonia Side.  Colposcopy of the upper vagina and cervix was performed after application os acetic acid. The findings revealed cervical erythema and most prominent at 5-6 o'clock. A slightly raised friable area on the left vagina around 5 o'clock by the cervix. WE lateral left wall. The transformation zone was/was not seen.  .  CERVIX, 6:00; BIOPSY:  - INFLAMED GRANULATION TISSUE AND SEPARATE FRAGMENTS OF  NECROINFLAMMATORY MATERIAL.  - AN EPITHELIAL SURFACE IS NOT PRESENT.  - STROMAL CHANGES CONSISTENT WITH PRIOR RADIATION THERAPY.  - NEGATIVE FOR DYSPLASIA AND MALIGNANCY.   Dr. Sonia Side recommended RTC for [facility] and possible additional biopsies. No bleeding the last few weeks.  Using her dilator.   HRT was rotated d/t bloating. She then experienced worsening headaches and has been rotated to Lo Loestrin Fe which she is tolerating well.   Gynecologic Oncology History Dawn Camacho is a pleasant y.o. G0 female who is seen in consultation from Dr. Dr Dalbert Garnet for cervical cancer.  05/08/21 seen by Midwife for post coital spotting x 1.5 months. Cervical exam: Large, pink, friable and highly vascularized, non-tender, not smooth, irregular cervical growth extending from 11-5.   PAP ASCUS-H/HPV+. Patient's last menstrual period was 04/29/2021.    Colposcopy confirmed irregular  cervical growth extending from 11-5:00. 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   06/12/2021 MRI Pelvis IMPRESSION: 1. 4.2 x 3.7 x 2.9 cm cervical mass involving the anterior aspect of the cervix and also likely involving the anterior aspect of the lower uterine segment. This extends into the vagina mainly on the left side. No MR findings to suggest involvement of the bladder, urethra rectum. 2. Borderline enlarged bilateral pelvic sidewall lymph nodes worrisome for metastatic disease.  06/19/2021 PET scan IMPRESSION: 1. Hypermetabolic activity of the cervix, compatible with primary malignancy. 2. Hypermetabolic subcentimeter bilateral external iliac lymph nodes, concerning for metastatic disease. 3. Mildly hypermetabolic left inguinal lymph node, concerning for additional site of metastatic disease. 4. Bilateral nonobstructing renal stones.  06/30/2021 Port placement  11/19/2021 PET scan IMPRESSION: 1. Complete metabolic response to therapy of cervical primary and pelvic nodal metastasis. 2. Bilateral nephrolithiasis.  60Gy to cervix and nodes at [facility] under the care of Dr. Rushie Chestnut in 07/10/2021-08/29/2021. T&O at [facility] with Dr. Marinell Blight.  7/10 - 09/15/21 Cervix, T&O/R Total Dose 2400 cGy (each fraction 600 cGy)  Post treatment PET 04/09/22 was negative.   12/17/2021 Dilator teaching to avoid vaginal stenosis, performed at [facility].   Married and used Paediatric nurse for contraception.  Has not had a strong desire for children, but not sure for certain.   Had 2 Gardesil vaccinations, but then last in the series was not done due to recall or some other problem.  06/24/22- HIV negative      02/24/2022 Brain MRI performed by Neurology for history of headaches IMPRESSION: Borderline Chiari 1 with slight crowding of the  craniocervical junction. Otherwise, normal brain MRI.    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 07/22/2022   Cervical cancer, FIGO stage III (HCC) 06/24/2021   Cervical cancer (HCC) 06/11/2021   Dyshydrosis 11/21/2015    Past Medical History: Past Medical History:  Diagnosis Date   Allergic rhinitis    Allergy    Cervical cancer (HCC)     Past Surgical History: Past Surgical History:  Procedure Laterality Date   IR IMAGING GUIDED PORT INSERTION  06/30/2021   TONSILECTOMY, ADENOIDECTOMY, BILATERAL MYRINGOTOMY AND TUBES Bilateral    TONSILLECTOMY     WISDOM TOOTH EXTRACTION       Family History: Family History  Problem Relation Age of Onset   Heart disease Father    Breast cancer Maternal Grandmother    Lung cancer Paternal Grandmother    Heart attack Paternal Grandfather     Social History: Social History   Socioeconomic History   Marital status: Married    Spouse name: Not on file   Number of children: Not on file   Years of education: Not on file   Highest education level: Not on file  Occupational History   Not on file  Tobacco Use   Smoking status: Never   Smokeless tobacco: Never  Vaping Use   Vaping status: Never Used  Substance and Sexual Activity   Alcohol use: Yes    Alcohol/week: 1.0 standard drink of alcohol    Types: 1 Glasses of wine per week    Comment: occasional--every couple of weeks.   Drug use: No   Sexual activity: Yes  Other Topics Concern   Not on file  Social History Narrative   Not on file   Social Determinants of Health   Financial Resource Strain: Not on file  Food Insecurity: Not on file  Transportation Needs: Not on file  Physical Activity: Not on file  Stress: Not on file  Social Connections: Not on file  Intimate Partner Violence: Not on file    Allergies: No Known Allergies  Current  Medications: Current Outpatient Medications  Medication Sig Dispense Refill   aspirin-acetaminophen-caffeine (EXCEDRIN MIGRAINE) 250-250-65 MG tablet Take by mouth every 6 (six) hours as needed for headache.     azithromycin (ZITHROMAX Z-PAK) 250 MG tablet Take 2 tablets (500 mg) today, then 1 tablet (250 mg) for next 4 days. (Patient not taking: Reported on 06/24/2022) 6 tablet 0   hydrOXYzine (ATARAX) 10 MG tablet SMARTSIG:1-4 Tablet(s) By Mouth Every 6 Hours PRN (Patient not taking: Reported on 06/24/2022)     lidocaine (XYLOCAINE) 5 % ointment Apply 1 Application topically as needed (Bring to appointment for biopsy.). 35.44 g 0   lidocaine-prilocaine (EMLA) cream APPLY TOPICALLY TO THE AFFECTED AREA 1 TIME 30 g 3   Norethindrone-Ethinyl Estradiol-Fe Biphas (LO LOESTRIN FE) 1 MG-10 MCG / 10 MCG tablet Take 1 tablet by mouth daily. Take pills continuously for 28 day cycle for hormonal replacement 28 tablet 11   nortriptyline (PAMELOR) 10 MG capsule Take 10 mg by mouth at bedtime.     rizatriptan (MAXALT) 10 MG tablet Take 10 mg by mouth as needed for migraine. May repeat in 2 hours if needed     No current facility-administered medications for this visit.  Facility-Administered Medications Ordered in Other Visits  Medication Dose Route Frequency Provider Last Rate Last Admin   sodium chloride flush (NS) 0.9 % injection 10 mL  10 mL Intravenous PRN Creig Hines, MD   10 mL at 07/07/21 0841   Review of Systems General:  no complaints Skin: no complaints Eyes: no complaints HEENT: no complaints Breasts: no complaints Pulmonary: no complaints Cardiac: no complaints Gastrointestinal: no complaints Genitourinary/Sexual: no complaints Ob/Gyn: no complaints Musculoskeletal: no complaints Hematology: no complaints Neurologic/Psych: no complaints   Objective:  Physical Examination:  BP 122/79   Pulse 73   Temp 98.6 F (37 C)   Resp 20   Wt 203 lb 1.6 oz (92.1 kg)   SpO2 100%   BMI  32.78 kg/m    ECOG Performance Status: 0 - Asymptomatic  GENERAL: Patient is a well appearing female in no acute distress. Accompanied by step mom Dawn Camacho  EXTREMITIES:  No peripheral edema.  NEURO:  Nonfocal. Well oriented.  Appropriate affect.  Pelvic:exam chaperoned by NP EGBUS: no lesions Cervix: Brownish staining at the os. No lesions seen.  See procedure note Vagina: no gross lesions, upper vagina agglutinated with filmy adhesions.  No lesions seen.    BME: deferred  PROCEDURE: The risks and benefits of the procedure were reviewed and informed consent obtained. Time out was performed. The patient received pre-procedure teaching and expressed understanding. The post-procedure instructions were reviewed with the patient and she expressed understanding. The patient does not have any barriers to learning.  Lidocaine was placed in the vagina in advance.    Some agglutination of the upper vagina broken down.  Endocervical curettage inserted up to 1 cm into the canal with metal curette and cytobrush.  Minimal material obtained.  Procedure completed without complications. The patient tolerated the procedure well.   Post-procedure evaluation the patient was stable without complaints.  Lab Review No labs on site today  Assessment:  Chaslynn Belleau Dozer is a 32 y.o. 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   Vaginal bleeding 2/24 and PET/CT negative.  Abnormal Pap with ASC-H and HRHPV positive 5/24.  Colposcopic vaginal biopsy 6/24 showed just inflammation.  Could not tolerate rest of procedure, so returns today.  Lidocaine placed in vagina and biopsy of endocervix performed.   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   Medical co-morbidities complicating care:  anxiety.  Plan:   Problem List Items Addressed This Visit       Genitourinary   Cervical cancer (HCC) - Primary    Cervical Cancer Surveillance- we reviewed NCCN guidelines for cervical cancer surveillance. Given abnormal Pap plan for follow up in Gyn Onc clinic in 3 months.   Follow up biopsy results from today, but believe that the mildly abnormal PAP is due to inflammation from radiation.    HRT- Started levonorgestrel-ethinyl estradiol/Lybrel 90-20 mcg but she experienced increased bloating. Switched to norethindrone acetate-ethinyl estradiol/Loestrin 1.5/30 mcg. She had increased headaches. We've now rotated to northethindrone-ethinyl estradiol/Lo Loestrin Fe 1/10 mcg tablet since 07/13/22. She will avoid all tobacco given increase risk of VTE with use of estrogen.   Will also add vaginal estrogen assuming the ECC from today comes back negative. My impression is that she has inflammation from radiation.  Discussed using the dilator to keep the vaginal  canal open.    Bone Health-  calcium 1200 mg and vitamin D 1000 iu daily for prevention of bone loss. Encouraged weight bearing exercise life long.   Vaginal Health- post radiation, she is at risk of vaginal atrophy and agglutination. We previously eviewed importance of keeping vagina open for surveillance of malignancy and sexual health. She will continue dilator use. May require use of vaginal lubricants.   IV access: port a cath was placed for administration of chemotherapy. Can consider removal at any time.   Cancer screenings and wellness- she will continue to see her pcp for cancer screenings and ongoing wellness.   The patient's diagnosis, an outline of the further diagnostic and laboratory studies which will be required, the recommendation, and alternatives were discussed.  All questions were answered to the patient's satisfaction.  Leida Lauth, MD

## 2022-09-25 ENCOUNTER — Ambulatory Visit: Payer: BC Managed Care – PPO | Admitting: Oncology

## 2022-09-25 ENCOUNTER — Other Ambulatory Visit: Payer: BC Managed Care – PPO

## 2022-09-30 ENCOUNTER — Other Ambulatory Visit: Payer: Self-pay | Admitting: *Deleted

## 2022-09-30 ENCOUNTER — Inpatient Hospital Stay (HOSPITAL_BASED_OUTPATIENT_CLINIC_OR_DEPARTMENT_OTHER): Payer: BC Managed Care – PPO | Admitting: Oncology

## 2022-09-30 ENCOUNTER — Other Ambulatory Visit: Payer: Self-pay

## 2022-09-30 ENCOUNTER — Inpatient Hospital Stay (HOSPITAL_BASED_OUTPATIENT_CLINIC_OR_DEPARTMENT_OTHER): Payer: BC Managed Care – PPO | Admitting: Obstetrics and Gynecology

## 2022-09-30 ENCOUNTER — Inpatient Hospital Stay: Payer: BC Managed Care – PPO | Attending: Oncology

## 2022-09-30 VITALS — BP 125/83 | HR 85 | Temp 97.9°F | Resp 20 | Ht 66.0 in | Wt 205.8 lb

## 2022-09-30 VITALS — BP 125/83 | HR 85 | Temp 97.9°F | Resp 20 | Wt 205.5 lb

## 2022-09-30 DIAGNOSIS — C539 Malignant neoplasm of cervix uteri, unspecified: Secondary | ICD-10-CM

## 2022-09-30 DIAGNOSIS — E28319 Asymptomatic premature menopause: Secondary | ICD-10-CM | POA: Insufficient documentation

## 2022-09-30 DIAGNOSIS — Z923 Personal history of irradiation: Secondary | ICD-10-CM | POA: Diagnosis not present

## 2022-09-30 DIAGNOSIS — Z8541 Personal history of malignant neoplasm of cervix uteri: Secondary | ICD-10-CM | POA: Insufficient documentation

## 2022-09-30 DIAGNOSIS — Z08 Encounter for follow-up examination after completed treatment for malignant neoplasm: Secondary | ICD-10-CM

## 2022-09-30 DIAGNOSIS — C531 Malignant neoplasm of exocervix: Secondary | ICD-10-CM

## 2022-09-30 DIAGNOSIS — Z79890 Hormone replacement therapy (postmenopausal): Secondary | ICD-10-CM | POA: Insufficient documentation

## 2022-09-30 DIAGNOSIS — Z9221 Personal history of antineoplastic chemotherapy: Secondary | ICD-10-CM | POA: Diagnosis not present

## 2022-09-30 DIAGNOSIS — Z95828 Presence of other vascular implants and grafts: Secondary | ICD-10-CM

## 2022-09-30 DIAGNOSIS — D069 Carcinoma in situ of cervix, unspecified: Secondary | ICD-10-CM

## 2022-09-30 LAB — CBC WITH DIFFERENTIAL (CANCER CENTER ONLY)
Abs Immature Granulocytes: 0 10*3/uL (ref 0.00–0.07)
Basophils Absolute: 0.1 10*3/uL (ref 0.0–0.1)
Basophils Relative: 1 %
Eosinophils Absolute: 0.1 10*3/uL (ref 0.0–0.5)
Eosinophils Relative: 3 %
HCT: 36.2 % (ref 36.0–46.0)
Hemoglobin: 13.2 g/dL (ref 12.0–15.0)
Immature Granulocytes: 0 %
Lymphocytes Relative: 37 %
Lymphs Abs: 1.8 10*3/uL (ref 0.7–4.0)
MCH: 33.2 pg (ref 26.0–34.0)
MCHC: 36.5 g/dL — ABNORMAL HIGH (ref 30.0–36.0)
MCV: 91.2 fL (ref 80.0–100.0)
Monocytes Absolute: 0.3 10*3/uL (ref 0.1–1.0)
Monocytes Relative: 7 %
Neutro Abs: 2.5 10*3/uL (ref 1.7–7.7)
Neutrophils Relative %: 52 %
Platelet Count: 233 10*3/uL (ref 150–400)
RBC: 3.97 MIL/uL (ref 3.87–5.11)
RDW: 11.9 % (ref 11.5–15.5)
WBC Count: 4.8 10*3/uL (ref 4.0–10.5)
nRBC: 0 % (ref 0.0–0.2)

## 2022-09-30 LAB — CMP (CANCER CENTER ONLY)
ALT: 18 U/L (ref 0–44)
AST: 21 U/L (ref 15–41)
Albumin: 4.3 g/dL (ref 3.5–5.0)
Alkaline Phosphatase: 61 U/L (ref 38–126)
Anion gap: 7 (ref 5–15)
BUN: 12 mg/dL (ref 6–20)
CO2: 21 mmol/L — ABNORMAL LOW (ref 22–32)
Calcium: 8.9 mg/dL (ref 8.9–10.3)
Chloride: 108 mmol/L (ref 98–111)
Creatinine: 0.85 mg/dL (ref 0.44–1.00)
GFR, Estimated: >60 mL/min (ref >60)
Glucose, Bld: 106 mg/dL — ABNORMAL HIGH (ref 70–99)
Potassium: 3.7 mmol/L (ref 3.5–5.1)
Sodium: 136 mmol/L (ref 135–145)
Total Bilirubin: 0.5 mg/dL (ref 0.3–1.2)
Total Protein: 7.1 g/dL (ref 6.5–8.1)

## 2022-09-30 MED ORDER — SODIUM CHLORIDE 0.9% FLUSH
10.0000 mL | INTRAVENOUS | Status: DC | PRN
  Administered 2022-09-30: 10 mL via INTRAVENOUS
  Filled 2022-09-30: qty 10

## 2022-09-30 MED ORDER — HEPARIN SOD (PORK) LOCK FLUSH 100 UNIT/ML IV SOLN
500.0000 [IU] | Freq: Once | INTRAVENOUS | Status: AC
  Administered 2022-09-30: 500 [IU] via INTRAVENOUS
  Filled 2022-09-30: qty 5

## 2022-09-30 NOTE — Patient Instructions (Signed)

## 2022-09-30 NOTE — Progress Notes (Signed)
Gynecologic Oncology Interval Visit   Referring Provider: Dr Dalbert Garnet  Chief Concern: cervical cancer visit for biopsy  Subjective:  Dawn Camacho is a 32 y.o. G0 female who is seen in consultation from Dr Dalbert Garnet for cervical cancer.   09/23/2022  Endocervix, curettage - HIGH-GRADE SQUAMOUS INTRAEPITHELIAL LESION (HSIL).  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 The specimen was processed as a cell block due to the limited amount of material on gross examination. Sections demonstrate an extremely scant specimen with rare atypical cells that are p16 positive. Deeper sections were examined. 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 Features of superimposed radiation therapy effect are noted. Stain controls worked appropriately.  She presents today to review these results and discussed next steps. She continues the LOESTRIN and feels she is doing much better on that OCP. She did not start or receive a prescription for estrogen.   Gynecologic Oncology History Dawn Camacho is a pleasant y.o. G0 female who is seen in consultation from Dr. Dr Dalbert Garnet for cervical cancer.  05/08/21 seen by Midwife for post coital spotting x 1.5 months. Cervical exam: Large, pink, friable and highly vascularized, non-tender, not smooth, irregular cervical growth extending from 11-5.   PAP ASCUS-H/HPV+. Patient's last menstrual period was 04/29/2021.    Colposcopy confirmed irregular cervical growth extending from 11-5:00. 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   06/12/2021 MRI Pelvis IMPRESSION: 1. 4.2 x 3.7 x 2.9 cm cervical mass involving the anterior aspect of the cervix and also likely involving the anterior aspect of the lower uterine segment. This extends into the vagina mainly on the left side. No MR findings to suggest involvement of  the bladder, urethra rectum. 2. Borderline enlarged bilateral pelvic sidewall lymph nodes worrisome for metastatic disease.  06/19/2021 PET scan IMPRESSION: 1. Hypermetabolic activity of the cervix, compatible with primary malignancy. 2. Hypermetabolic subcentimeter bilateral external iliac lymph nodes, concerning for metastatic disease. 3. Mildly hypermetabolic left inguinal lymph node, concerning for additional site of metastatic disease. 4. Bilateral nonobstructing renal stones.  06/30/2021 Port placement  11/19/2021 PET scan IMPRESSION: 1. Complete metabolic response to therapy of cervical primary and pelvic nodal metastasis. 2. Bilateral nephrolithiasis.  60Gy to cervix and nodes at [facility] under the care of Dr. Rushie Chestnut in 07/10/2021-08/29/2021. T&O at [facility] with Dr. Marinell Blight.  7/10 - 09/15/21 Cervix, T&O/R Total Dose 2400 cGy (each fraction 600 cGy)  Post treatment PET 04/09/22 was negative.   12/17/2021 Dilator teaching to avoid vaginal stenosis, performed at [facility].   Married and used Paediatric nurse for contraception.  Has not had a strong desire for children, but not sure for certain.   Had 2 Gardesil vaccinations, but then last in the series was not done due to recall or some other problem.  06/24/22- HIV negative      02/24/2022 Brain MRI performed by Neurology for history of headaches IMPRESSION: Borderline Chiari 1 with slight crowding of the craniocervical junction. Otherwise, normal brain MRI.  04/09/2022 PET  IMPRESSION: No evidence of hypermetabolic recurrent or metastatic disease.  06/24/22 for surveillance. Pap was obtained but was insufficient for testing. Additionally, she had post coital spotting. Granulation tissue on cervix was observed and thought to be etiology. She was treated with silver nitrate. HPV was positive. It was recollected on 07/22/22 and reported as EPCA, ASCUS-H, HR HPV Positive.   08/05/22 colposcopy and biopsy with Dr  .  Colposcopy of the upper vagina and cervix  was performed after application os acetic acid. The findings revealed cervical erythema and most prominent at 5-6 o'clock. A slightly raised friable area on the left vagina around 5 o'clock by the cervix. WE lateral left wall. The transformation zone was/was not seen.  .  CERVIX, 6:00; BIOPSY:  - INFLAMED GRANULATION TISSUE AND SEPARATE FRAGMENTS OF  NECROINFLAMMATORY MATERIAL.  - AN EPITHELIAL SURFACE IS NOT PRESENT.  - STROMAL CHANGES CONSISTENT WITH PRIOR RADIATION THERAPY.  - NEGATIVE FOR DYSPLASIA AND MALIGNANCY.  Recommended RTC for ECC and possible additional biopsies.   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 07/22/2022   Cervical cancer, FIGO stage III (HCC) 06/24/2021   Cervical cancer (HCC) 06/11/2021   Dyshydrosis 11/21/2015    Past Medical History: Past Medical History:  Diagnosis Date   Allergic rhinitis    Allergy    Cervical cancer (HCC)     Past Surgical History: Past Surgical History:  Procedure Laterality Date   IR IMAGING GUIDED PORT INSERTION  06/30/2021   TONSILECTOMY, ADENOIDECTOMY, BILATERAL MYRINGOTOMY AND TUBES Bilateral    TONSILLECTOMY     WISDOM TOOTH EXTRACTION       Family History: Family History  Problem Relation Age of Onset   Heart disease Father    Breast cancer Maternal Grandmother    Lung cancer Paternal Grandmother    Heart attack Paternal Grandfather     Social History: Social History   Socioeconomic History   Marital status: Married    Spouse name: Not on file   Number of children: Not on file   Years of education: Not on file   Highest education level: Not on file  Occupational History   Not on file  Tobacco Use   Smoking status: Never   Smokeless  tobacco: Never  Vaping Use   Vaping status: Never Used  Substance and Sexual Activity   Alcohol use: Yes    Alcohol/week: 1.0 standard drink of alcohol    Types: 1 Glasses of wine per week    Comment: occasional--every couple of weeks.   Drug use: No   Sexual activity: Yes  Other Topics Concern   Not on file  Social History Narrative   Not on file   Social Determinants of Health   Financial Resource Strain: Not on file  Food Insecurity: Not on file  Transportation Needs: Not on file  Physical Activity: Not on file  Stress: Not on file  Social Connections: Not on file  Intimate Partner Violence: Not on file    Allergies: No Known Allergies  Current Medications: Current Outpatient Medications  Medication Sig Dispense Refill   aspirin-acetaminophen-caffeine (EXCEDRIN MIGRAINE) 250-250-65 MG tablet Take by mouth every 6 (six) hours as needed for headache.     lidocaine (XYLOCAINE) 5 % ointment Apply 1 Application topically as needed (Bring to appointment for biopsy.). 35.44 g 0   lidocaine-prilocaine (EMLA) cream APPLY TOPICALLY TO THE AFFECTED AREA 1 TIME 30 g 3   Norethindrone-Ethinyl Estradiol-Fe Biphas (LO LOESTRIN FE) 1 MG-10 MCG / 10 MCG tablet Take 1 tablet by mouth daily. Take pills continuously for 28 day cycle for hormonal replacement 28 tablet 11   nortriptyline (PAMELOR) 10 MG capsule Take 10  mg by mouth at bedtime.     rizatriptan (MAXALT) 10 MG tablet Take 10 mg by mouth as needed for migraine. May repeat in 2 hours if needed     azithromycin (ZITHROMAX Z-PAK) 250 MG tablet Take 2 tablets (500 mg) today, then 1 tablet (250 mg) for next 4 days. (Patient not taking: Reported on 06/24/2022) 6 tablet 0   hydrOXYzine (ATARAX) 10 MG tablet SMARTSIG:1-4 Tablet(s) By Mouth Every 6 Hours PRN (Patient not taking: Reported on 06/24/2022)     No current facility-administered medications for this visit.   Facility-Administered Medications Ordered in Other Visits  Medication Dose  Route Frequency Provider Last Rate Last Admin   sodium chloride flush (NS) 0.9 % injection 10 mL  10 mL Intravenous PRN Creig Hines, MD   10 mL at 07/07/21 0841   sodium chloride flush (NS) 0.9 % injection 10 mL  10 mL Intravenous PRN Creig Hines, MD   10 mL at 09/30/22 1523   Review of Systems General:  no complaints Skin: no complaints Eyes: no complaints HEENT: no complaints Breasts: no complaints Pulmonary: no complaints Cardiac: no complaints Gastrointestinal: no complaints Genitourinary/Sexual: no complaints Ob/Gyn: no complaints Musculoskeletal: no complaints Hematology: no complaints Neurologic/Psych: no complaints   Objective:  Physical Examination:  BP 125/83   Pulse 85   Temp 97.9 F (36.6 C)   Resp 20   Wt 205 lb 8 oz (93.2 kg)   SpO2 100%   BMI 33.17 kg/m    ECOG Performance Status: 0 - Asymptomatic  GENERAL: Patient is a well appearing female in no acute distress HEENT: Atraumatic normocephalic.  LUNGS:  Nornal respiratory effort.  ABDOMEN:  Soft, nontender. Nondistended.  No masses or ascites EXTREMITIES:  No peripheral edema.   NEURO:  Nonfocal. Well oriented.  Appropriate affect.  Pelvic: Chaperoned with CMA EGBUS: no lesions Cervix: biopsy site almost completely healed. The os is not clearly seen.  The cervix is flush with the vagina.  Firm to palpation Vagina: no lesions, no discharge or bleeding Uterus: not grossly enlarged; seems to be normal size but exam limited, nontender, mobile BME: no palpable masses, parametria is smooth and there is no nodularity Rectovaginal: deferred.    Lab Review No labs on site today  Assessment:  Dawn Camacho is a 32 y.o. 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    Vaginal bleeding 2/24 and PET/CT negative.  Abnormal Pap with ASC-H and HRHPV  positive 5/24.  Colposcopic vaginal biopsy 6/24 showed just inflammation.  ECC HGSIL.   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   Port-a-cath in place  Peripheral neuropathy has resolved.   Medical co-morbidities complicating care: anxiety.  Plan:   Problem List Items Addressed This Visit       Genitourinary   Cervical cancer (HCC) - Primary   Other Visit Diagnoses     High grade squamous intraepithelial lesion (HGSIL), grade 3 CIN, on biopsy of cervix          I recommended evaluation at [facility] Tumor Board.  It would be very difficult to perform a CKC given the anatomy.  However a LEEP may be an option.  I ordered a pelvic MRI for further evaluation.  Will await for further Tumor Board recommendations however they may recommend a pathology review and repeat PET scan.  Continue LOESTRIN. Bone Health-  calcium 1200 mg and vitamin D 1000 iu daily for prevention of bone loss. Encouraged weight bearing exercise life long.   Vaginal Health- post radiation, she is at risk of vaginal atrophy and agglutination. We previously eviewed importance of keeping vagina open for surveillance of malignancy and sexual health. She will continue dilator use. May require use of vaginal lubricants.   IV access: port a cath was placed for administration of chemotherapy. Can consider removal at any time.   Cancer screenings and wellness- she will continue to see her pcp for cancer screenings and ongoing wellness.   The patient's diagnosis, an outline of the further diagnostic and laboratory studies which will be required, the recommendation, and alternatives were discussed.  All questions were answered to the patient's satisfaction.   Leta Jungling, MD

## 2022-10-01 ENCOUNTER — Ambulatory Visit: Payer: BC Managed Care – PPO | Admitting: Radiation Oncology

## 2022-10-02 ENCOUNTER — Encounter: Payer: Self-pay | Admitting: Oncology

## 2022-10-02 NOTE — Progress Notes (Signed)
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  She was seen by GYN and had a cervical exam which showed a large pink friable vascularized irregular growth in the cervix extending from 11:00 to 5 o'clock position.  Pap showed ASCUS and high risk HPV.  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  She is married and uses NuvaRing for contraception.  She does not have any children yet.  She has had 2 Gardasil vaccinations in the past but did not receive a booster patient was seen by GYN oncology Dr. Johnnette Litter and plan was to obtain a PET CT scan to decide if she would be a candidate for surgery.  PET scan showed hypermetabolic activity of the cervix compatible with primary malignancy.  Hypermetabolic subcentimeter bilateral external iliac lymph nodes concerning for metastatic disease.  Hypermetabolic left inguinal lymph node concerning for additional site of metastatic disease.  Bilateral nonobstructing renal stones.   Plan is to proceed with  definitive concurrent chemoradiation with weekly cisplatin which she stopped on 07/07/2021.  She received vaginal brachytherapy at [facility] in July 2023.  Interval history- Patient was seen by Dr. Johnnette Litter in July 2024 and as a part of pelvic exam also undergone endocervical curettage.  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  She is doing well overall.  She is using vaginal dilators to prevent agglutination.  She is on Loestrin along with calcium and vitamin D as well  ECOG PS- 0 Pain scale- 0   Review of systems- Review of Systems  Constitutional:  Negative for chills, fever, malaise/fatigue and weight loss.  HENT:  Negative for congestion, ear discharge and nosebleeds.   Eyes:  Negative for blurred vision.  Respiratory:  Negative for cough, hemoptysis, sputum production, shortness of breath and wheezing.   Cardiovascular:  Negative for chest pain, palpitations, orthopnea and claudication.  Gastrointestinal:  Negative for abdominal pain, blood in stool, constipation, diarrhea, heartburn, melena, nausea and vomiting.  Genitourinary:  Negative for dysuria, flank pain, frequency, hematuria and urgency.  Musculoskeletal:  Negative for back pain, joint pain and myalgias.  Skin:  Negative for rash.  Neurological:  Negative for dizziness, tingling, focal weakness, seizures, weakness and headaches.  Endo/Heme/Allergies:  Does not bruise/bleed easily.  Psychiatric/Behavioral:  Negative for depression and suicidal ideas. The patient does not have insomnia.       No Known Allergies   Past Medical History:  Diagnosis Date   Allergic rhinitis    Allergy    Cervical cancer (HCC)      Past Surgical History:  Procedure Laterality Date   IR IMAGING GUIDED PORT INSERTION  06/30/2021   TONSILECTOMY, ADENOIDECTOMY, BILATERAL MYRINGOTOMY AND TUBES Bilateral  TONSILLECTOMY     WISDOM TOOTH EXTRACTION      Social History   Socioeconomic History   Marital status: Married    Spouse name: Not  on file   Number of children: Not on file   Years of education: Not on file   Highest education level: Not on file  Occupational History   Not on file  Tobacco Use   Smoking status: Never   Smokeless tobacco: Never  Vaping Use   Vaping status: Never Used  Substance and Sexual Activity   Alcohol use: Yes    Alcohol/week: 1.0 standard drink of alcohol    Types: 1 Glasses of wine per week    Comment: occasional--every couple of weeks.   Drug use: No   Sexual activity: Yes  Other Topics Concern   Not on file  Social History Narrative   Not on file   Social Determinants of Health   Financial Resource Strain: Not on file  Food Insecurity: Not on file  Transportation Needs: Not on file  Physical Activity: Not on file  Stress: Not on file  Social Connections: Not on file  Intimate Partner Violence: Not on file    Family History  Problem Relation Age of Onset   Heart disease Father    Breast cancer Maternal Grandmother    Lung cancer Paternal Grandmother    Heart attack Paternal Grandfather      Current Outpatient Medications:    aspirin-acetaminophen-caffeine (EXCEDRIN MIGRAINE) 250-250-65 MG tablet, Take by mouth every 6 (six) hours as needed for headache., Disp: , Rfl:    azithromycin (ZITHROMAX Z-PAK) 250 MG tablet, Take 2 tablets (500 mg) today, then 1 tablet (250 mg) for next 4 days. (Patient not taking: Reported on 06/24/2022), Disp: 6 tablet, Rfl: 0   hydrOXYzine (ATARAX) 10 MG tablet, SMARTSIG:1-4 Tablet(s) By Mouth Every 6 Hours PRN (Patient not taking: Reported on 06/24/2022), Disp: , Rfl:    lidocaine (XYLOCAINE) 5 % ointment, Apply 1 Application topically as needed (Bring to appointment for biopsy.)., Disp: 35.44 g, Rfl: 0   lidocaine-prilocaine (EMLA) cream, APPLY TOPICALLY TO THE AFFECTED AREA 1 TIME, Disp: 30 g, Rfl: 3   Norethindrone-Ethinyl Estradiol-Fe Biphas (LO LOESTRIN FE) 1 MG-10 MCG / 10 MCG tablet, Take 1 tablet by mouth daily. Take pills continuously for  28 day cycle for hormonal replacement, Disp: 28 tablet, Rfl: 11   nortriptyline (PAMELOR) 10 MG capsule, Take 10 mg by mouth at bedtime., Disp: , Rfl:    rizatriptan (MAXALT) 10 MG tablet, Take 10 mg by mouth as needed for migraine. May repeat in 2 hours if needed, Disp: , Rfl:  No current facility-administered medications for this visit.  Facility-Administered Medications Ordered in Other Visits:    sodium chloride flush (NS) 0.9 % injection 10 mL, 10 mL, Intravenous, PRN, Creig Hines, MD, 10 mL at 07/07/21 0841  Physical exam:  Vitals:   09/30/22 1556  BP: 125/83  Pulse: 85  Resp: 20  Temp: 97.9 F (36.6 C)  TempSrc: Tympanic  SpO2: 100%  Weight: 205 lb 12.8 oz (93.4 kg)  Height: 5\' 6"  (1.676 m)   Physical Exam Cardiovascular:     Rate and Rhythm: Normal rate and regular rhythm.     Heart sounds: Normal heart sounds.  Pulmonary:     Effort: Pulmonary effort is normal.     Breath sounds: Normal breath sounds.  Abdominal:     General: Bowel sounds are normal.     Palpations: Abdomen is  soft.  Skin:    General: Skin is warm and dry.  Neurological:     Mental Status: She is alert and oriented to person, place, and time.         Latest Ref Rng & Units 09/30/2022    3:26 PM  CMP  Glucose 70 - 99 mg/dL 151   BUN 6 - 20 mg/dL 12   Creatinine 7.61 - 1.00 mg/dL 6.07   Sodium 371 - 062 mmol/L 136   Potassium 3.5 - 5.1 mmol/L 3.7   Chloride 98 - 111 mmol/L 108   CO2 22 - 32 mmol/L 21   Calcium 8.9 - 10.3 mg/dL 8.9   Total Protein 6.5 - 8.1 g/dL 7.1   Total Bilirubin 0.3 - 1.2 mg/dL 0.5   Alkaline Phos 38 - 126 U/L 61   AST 15 - 41 U/L 21   ALT 0 - 44 U/L 18       Latest Ref Rng & Units 09/30/2022    3:26 PM  CBC  WBC 4.0 - 10.5 K/uL 4.8   Hemoglobin 12.0 - 15.0 g/dL 69.4   Hematocrit 85.4 - 46.0 % 36.2   Platelets 150 - 400 K/uL 233     Assessment and plan- Patient is a 32 y.o. female With history of stage III HPV positive cervical cancer s/p concurrent  chemoradiation currently in remission here for routine follow-up   patient recently had an endocervical curettage which was consistent with high-grade SIL.  She is seeing GYN oncology following.  Case will be discussed at tumor board and consideration is being given for hysterectomy versus LEEP procedure.  She is also being MRI pelvis done at this time.  For the management of high-grade SIL as per her GYN oncology.  With regards to cervical cancer clinically she is doing well with no concerning signs and symptoms of exam.  It has been 1 year since completion of chemotherapy and her port can be taken.  This can be arranged anytime if she would have a hysterectomy at the same time.  Labs and see me in 6 months   Visit Diagnosis 1. Encounter for follow-up surveillance of cervical cancer      Dr. Owens Shark, MD, MPH [facility] [phone] 10/02/2022 8:18 AM

## 2022-10-03 NOTE — Progress Notes (Deleted)
New patient visit  Patient: Dawn Camacho   DOB: [birth year]   32 y.o. Female  MRN: [id number] Visit Date: 10/06/2022  Today's healthcare provider: Debera Lat, PA-C   No chief complaint on file.  Subjective    Dawn Camacho is a 32 y.o. female who presents today as a new patient to establish care.  HPI  *** Discussed the use of AI scribe software for clinical note transcription with the patient, who gave verbal consent to proceed.  History of Present Illness            Past Medical History:  Diagnosis Date  . Allergic rhinitis   . Allergy   . Cervical cancer [facility])    Past Surgical History:  Procedure Laterality Date  . IR IMAGING GUIDED PORT INSERTION  06/30/2021  . TONSILECTOMY, ADENOIDECTOMY, BILATERAL MYRINGOTOMY AND TUBES Bilateral   . TONSILLECTOMY    . WISDOM TOOTH EXTRACTION     Family Status  Relation Name Status  . Mother  Alive  . Father  Alive  . MGM  (Not Specified)  . PGM  (Not Specified)  . PGF  (Not Specified)  No partnership data on file   Family History  Problem Relation Age of Onset  . Heart disease Father   . Breast cancer Maternal Grandmother   . Lung cancer Paternal Grandmother   . Heart attack Paternal Grandfather    Social History   Socioeconomic History  . Marital status: Married    Spouse name: Not on file  . Number of children: Not on file  . Years of education: Not on file  . Highest education level: Not on file  Occupational History  . Not on file  Tobacco Use  . Smoking status: Never  . Smokeless tobacco: Never  Vaping Use  . Vaping status: Never Used  Substance and Sexual Activity  . Alcohol use: Yes    Alcohol/week: 1.0 standard drink of alcohol    Types: 1 Glasses of wine per week    Comment: occasional--every couple of weeks.  . Drug use: No  . Sexual activity: Yes  Other Topics Concern  . Not on file  Social History Narrative  . Not on file   Social Determinants of Health   Financial Resource  Strain: Not on file  Food Insecurity: Not on file  Transportation Needs: Not on file  Physical Activity: Not on file  Stress: Not on file  Social Connections: Not on file   Outpatient Medications Prior to Visit  Medication Sig  . aspirin-acetaminophen-caffeine (EXCEDRIN MIGRAINE) 250-250-65 MG tablet Take by mouth every 6 (six) hours as needed for headache.  Marland Kitchen azithromycin (ZITHROMAX Z-PAK) 250 MG tablet Take 2 tablets (500 mg) today, then 1 tablet (250 mg) for next 4 days. (Patient not taking: Reported on 06/24/2022)  . hydrOXYzine (ATARAX) 10 MG tablet SMARTSIG:1-4 Tablet(s) By Mouth Every 6 Hours PRN (Patient not taking: Reported on 06/24/2022)  . lidocaine (XYLOCAINE) 5 % ointment Apply 1 Application topically as needed (Bring to appointment for biopsy.).  Marland Kitchen lidocaine-prilocaine (EMLA) cream APPLY TOPICALLY TO THE AFFECTED AREA 1 TIME  . Norethindrone-Ethinyl Estradiol-Fe Biphas (LO LOESTRIN FE) 1 MG-10 MCG / 10 MCG tablet Take 1 tablet by mouth daily. Take pills continuously for 28 day cycle for hormonal replacement  . nortriptyline (PAMELOR) 10 MG capsule Take 10 mg by mouth at bedtime.  . rizatriptan (MAXALT) 10 MG tablet Take 10 mg by mouth as needed for migraine. May repeat in 2 hours  if needed   Facility-Administered Medications Prior to Visit  Medication Dose Route Frequency Provider  . sodium chloride flush (NS) 0.9 % injection 10 mL  10 mL Intravenous PRN Creig Hines, MD   No Known Allergies   There is no immunization history on file for this patient.  Health Maintenance  Topic Date Due  . COVID-19 Vaccine (1) Never done  . Hepatitis C Screening  Never done  . DTaP/Tdap/Td (1 - Tdap) Never done  . INFLUENZA VACCINE  09/24/2022  . PAP SMEAR-Modifier  07/21/2025  . HIV Screening  Completed  . HPV VACCINES  Aged Out    Patient Care Team: Pcp, No as PCP - General Patient, No Pcp Per (General Practice) Benita Gutter, RN as Oncology Nurse Navigator Creig Hines,  MD as Consulting Physician (Oncology) Leida Lauth, MD as Referring Physician (Obstetrics) Marinell Blight, Marcelyn Ditty, MD as Referring Physician (Radiation Oncology) Carmina Miller, MD as Consulting Physician (Radiation Oncology)  Review of Systems  All other systems reviewed and are negative. Except see HPI   {Insert previous labs (optional):23779} {See past labs  Heme  Chem  Endocrine  Serology  Results Review (optional):1}   Objective    There were no vitals taken for this visit. {Insert last BP/Wt (optional):23777}{See vitals history (optional):1}   Physical Exam Vitals reviewed.  Constitutional:      General: She is not in acute distress.    Appearance: Normal appearance. She is well-developed. She is not diaphoretic.  HENT:     Head: Normocephalic and atraumatic.  Eyes:     General: No scleral icterus.    Conjunctiva/sclera: Conjunctivae normal.  Neck:     Thyroid: No thyromegaly.  Cardiovascular:     Rate and Rhythm: Normal rate and regular rhythm.     Pulses: Normal pulses.     Heart sounds: Normal heart sounds. No murmur heard. Pulmonary:     Effort: Pulmonary effort is normal. No respiratory distress.     Breath sounds: Normal breath sounds. No wheezing, rhonchi or rales.  Musculoskeletal:     Cervical back: Neck supple.     Right lower leg: No edema.     Left lower leg: No edema.  Lymphadenopathy:     Cervical: No cervical adenopathy.  Skin:    General: Skin is warm and dry.     Findings: No rash.  Neurological:     Mental Status: She is alert and oriented to person, place, and time. Mental status is at baseline.  Psychiatric:        Mood and Affect: Mood normal.        Behavior: Behavior normal.   Depression Screen     No data to display         No results found for any visits on 10/06/22.  Assessment & Plan        Encounter to establish care Welcomed to our clinic Reviewed past medical hx, social hx, family hx and surgical hx Pt  advised to send all vaccination records or screening   No follow-ups on file.    The patient was advised to call back or seek an in-person evaluation if the symptoms worsen or if the condition fails to improve as anticipated.  I discussed the assessment and treatment plan with the patient. The patient was provided an opportunity to ask questions and all were answered. The patient agreed with the plan and demonstrated an understanding of the instructions.  I, Debera Lat, PA-C have reviewed  all documentation for this visit. The documentation on  10/06/22 for the exam, diagnosis, procedures, and orders are all accurate and complete.  Debera Lat, [facility], MMS [facility] [phone] (phone) [phone] (fax)  [facility]

## 2022-10-05 ENCOUNTER — Other Ambulatory Visit: Payer: Self-pay

## 2022-10-05 DIAGNOSIS — C539 Malignant neoplasm of cervix uteri, unspecified: Secondary | ICD-10-CM

## 2022-10-05 DIAGNOSIS — D069 Carcinoma in situ of cervix, unspecified: Secondary | ICD-10-CM

## 2022-10-06 ENCOUNTER — Ambulatory Visit: Payer: Self-pay | Admitting: Physician Assistant

## 2022-10-06 DIAGNOSIS — E669 Obesity, unspecified: Secondary | ICD-10-CM

## 2022-10-06 DIAGNOSIS — Z7689 Persons encountering health services in other specified circumstances: Secondary | ICD-10-CM

## 2022-10-06 DIAGNOSIS — E8940 Asymptomatic postprocedural ovarian failure: Secondary | ICD-10-CM

## 2022-10-06 DIAGNOSIS — C539 Malignant neoplasm of cervix uteri, unspecified: Secondary | ICD-10-CM

## 2022-10-07 ENCOUNTER — Ambulatory Visit
Admission: RE | Admit: 2022-10-07 | Discharge: 2022-10-07 | Disposition: A | Discharge status: Home / Self Care | Payer: BC Managed Care – PPO | Source: Ambulatory Visit | Attending: Obstetrics and Gynecology | Admitting: Obstetrics and Gynecology

## 2022-10-07 DIAGNOSIS — C531 Malignant neoplasm of exocervix: Secondary | ICD-10-CM | POA: Insufficient documentation

## 2022-10-07 DIAGNOSIS — D069 Carcinoma in situ of cervix, unspecified: Secondary | ICD-10-CM | POA: Insufficient documentation

## 2022-10-07 DIAGNOSIS — Z8541 Personal history of malignant neoplasm of cervix uteri: Secondary | ICD-10-CM | POA: Diagnosis not present

## 2022-10-07 DIAGNOSIS — R188 Other ascites: Secondary | ICD-10-CM | POA: Diagnosis not present

## 2022-10-07 MED ORDER — HEPARIN SOD (PORK) LOCK FLUSH 100 UNIT/ML IV SOLN
INTRAVENOUS | Status: AC
  Filled 2022-10-07: qty 5

## 2022-10-07 MED ORDER — HEPARIN SOD (PORK) LOCK FLUSH 100 UNIT/ML IV SOLN
500.0000 [IU] | Freq: Once | INTRAVENOUS | Status: AC
  Administered 2022-10-07: 500 [IU] via INTRAVENOUS
  Filled 2022-10-07: qty 5

## 2022-10-07 MED ORDER — GADOBUTROL 1 MMOL/ML IV SOLN
9.0000 mL | Freq: Once | INTRAVENOUS | Status: AC | PRN
  Administered 2022-10-07: 9 mL via INTRAVENOUS

## 2022-10-13 ENCOUNTER — Other Ambulatory Visit: Payer: BC Managed Care – PPO

## 2022-10-14 ENCOUNTER — Ambulatory Visit
Admission: RE | Admit: 2022-10-14 | Discharge: 2022-10-14 | Disposition: A | Discharge status: Home / Self Care | Payer: BC Managed Care – PPO | Source: Ambulatory Visit | Attending: Oncology | Admitting: Oncology

## 2022-10-14 DIAGNOSIS — C539 Malignant neoplasm of cervix uteri, unspecified: Secondary | ICD-10-CM | POA: Insufficient documentation

## 2022-10-14 DIAGNOSIS — D069 Carcinoma in situ of cervix, unspecified: Secondary | ICD-10-CM

## 2022-10-14 LAB — GLUCOSE, CAPILLARY: Glucose-Capillary: 70 mg/dL (ref 70–99)

## 2022-10-14 MED ORDER — FLUDEOXYGLUCOSE F - 18 (FDG) INJECTION
10.6000 | Freq: Once | INTRAVENOUS | Status: AC
  Administered 2022-10-14: 11.32 via INTRAVENOUS

## 2022-10-19 ENCOUNTER — Telehealth: Payer: Self-pay | Admitting: Nurse Practitioner

## 2022-10-19 DIAGNOSIS — C539 Malignant neoplasm of cervix uteri, unspecified: Secondary | ICD-10-CM

## 2022-10-19 NOTE — Telephone Encounter (Signed)
Called patient to review PET results. No answer. Left vm.

## 2022-10-20 ENCOUNTER — Ambulatory Visit: Payer: BC Managed Care – PPO | Admitting: Physician Assistant

## 2022-10-20 ENCOUNTER — Encounter: Payer: Self-pay | Admitting: Physician Assistant

## 2022-10-20 VITALS — BP 129/87 | HR 65

## 2022-10-20 DIAGNOSIS — J329 Chronic sinusitis, unspecified: Secondary | ICD-10-CM | POA: Diagnosis not present

## 2022-10-20 DIAGNOSIS — Z7689 Persons encountering health services in other specified circumstances: Secondary | ICD-10-CM

## 2022-10-20 DIAGNOSIS — Z2821 Immunization not carried out because of patient refusal: Secondary | ICD-10-CM

## 2022-10-20 DIAGNOSIS — Z6832 Body mass index (BMI) 32.0-32.9, adult: Secondary | ICD-10-CM

## 2022-10-20 DIAGNOSIS — E669 Obesity, unspecified: Secondary | ICD-10-CM | POA: Diagnosis not present

## 2022-10-20 DIAGNOSIS — K5909 Other constipation: Secondary | ICD-10-CM | POA: Diagnosis not present

## 2022-10-20 DIAGNOSIS — Z8541 Personal history of malignant neoplasm of cervix uteri: Secondary | ICD-10-CM | POA: Insufficient documentation

## 2022-10-20 MED ORDER — SEMAGLUTIDE-WEIGHT MANAGEMENT 0.25 MG/0.5ML ~~LOC~~ SOAJ
0.2500 mg | SUBCUTANEOUS | 1 refills | Status: DC
Start: 2022-10-20 — End: 2022-12-28

## 2022-10-20 NOTE — Progress Notes (Signed)
New patient visit  Patient: Dawn Camacho   DOB: 06-26-1990   32 y.o. Female  MRN: [id number] Visit Date: 10/20/2022  Today's healthcare provider: Debera Lat, PA-C   Chief Complaint  Patient presents with   New Patient (Initial Visit)   Subjective    Dawn Camacho is a 32 y.o. female who presents today as a new patient to establish care.   Discussed the use of AI scribe software for clinical note transcription with the patient, who gave verbal consent to proceed.  History of Present Illness   The patient, with a history of cervical cancer, presents for primary care consultation. She reports frequent sinusitis infections, experiencing two to four episodes annually. These episodes often necessitate urgent care visits for antibiotic treatment due to rapid symptom progression.  The patient also expresses concern about recent weight gain, which she attributes to her previous chemotherapy and radiation treatments. Despite maintaining a well-balanced diet and exercising three to four times a week for over an hour each session, she has not observed any progress in weight loss.  The patient denies any history of diabetes, cholesterol issues, or blood pressure problems. She also reports no significant fatigue or tiredness, attributing any tiredness to her early morning work schedule. The patient has a history of hormone replacement therapy, which she reports causes mild constipation.        Past Medical History:  Diagnosis Date   Allergic rhinitis    Allergy    Anxiety 2016   Cervical cancer (HCC)    Past Surgical History:  Procedure Laterality Date   IR IMAGING GUIDED PORT INSERTION  06/30/2021   TONSILECTOMY, ADENOIDECTOMY, BILATERAL MYRINGOTOMY AND TUBES Bilateral    TONSILLECTOMY     WISDOM TOOTH EXTRACTION     Family Status  Relation Name Status   Mother Ginger Alive   Father Thommy Woelfel Alive   MGM  (Not Specified)   PGM Pearletha Alfred (Not Specified)   PGF  (Not  Specified)   Brother Runner, broadcasting/film/video (Not Specified)  No partnership data on file   Family History  Problem Relation Age of Onset   Depression Mother    Hearing loss Mother    Kidney disease Mother    Miscarriages / [place] Mother    Obesity Mother    Heart disease Father    ADD / ADHD Father    Breast cancer Maternal Grandmother    Lung cancer Paternal Grandmother    Cancer Paternal Grandmother    COPD Paternal Grandmother    Heart attack Paternal Grandfather    ADD / ADHD Brother    Social History   Socioeconomic History   Marital status: Married    Spouse name: Not on file   Number of children: Not on file   Years of education: Not on file   Highest education level: Associate degree: academic program  Occupational History   Not on file  Tobacco Use   Smoking status: Never   Smokeless tobacco: Never  Vaping Use   Vaping status: Never Used  Substance and Sexual Activity   Alcohol use: Yes    Alcohol/week: 1.0 standard drink of alcohol    Types: 1 Glasses of wine per week    Comment: occasional--every couple of weeks.   Drug use: No   Sexual activity: Yes    Birth control/protection: None  Other Topics Concern   Not on file  Social History Narrative   Not on file   Social Determinants of Health  Financial Resource Strain: Low Risk  (10/19/2022)   Overall Financial Resource Strain (CARDIA)    Difficulty of Paying Living Expenses: Not hard at all  Food Insecurity: No Food Insecurity (10/19/2022)   Hunger Vital Sign    Worried About Running Out of Food in the Last Year: Never true    Ran Out of Food in the Last Year: Never true  Transportation Needs: No Transportation Needs (10/19/2022)   PRAPARE - Administrator, Civil Service (Medical): No    Lack of Transportation (Non-Medical): No  Physical Activity: Insufficiently Active (10/19/2022)   Exercise Vital Sign    Days of Exercise per Week: 2 days    Minutes of Exercise per Session: 30 min   Stress: No Stress Concern Present (10/19/2022)   [employer] of Occupational Health - Occupational Stress Questionnaire    Feeling of Stress : Only a little  Social Connections: Moderately Isolated (10/19/2022)   Social Connection and Isolation Panel [NHANES]    Frequency of Communication with Friends and Family: More than three times a week    Frequency of Social Gatherings with Friends and Family: Three times a week    Attends Religious Services: Never    Active Member of Clubs or Organizations: No    Attends Engineer, structural: Not on file    Marital Status: Married   Outpatient Medications Prior to Visit  Medication Sig   aspirin-acetaminophen-caffeine (EXCEDRIN MIGRAINE) [phone] MG tablet Take by mouth every 6 (six) hours as needed for headache.   lidocaine (XYLOCAINE) 5 % ointment Apply 1 Application topically as needed (Bring to appointment for biopsy.).   lidocaine-prilocaine (EMLA) cream APPLY TOPICALLY TO THE AFFECTED AREA 1 TIME   Norethindrone-Ethinyl Estradiol-Fe Biphas (LO LOESTRIN FE) 1 MG-10 MCG / 10 MCG tablet Take 1 tablet by mouth daily. Take pills continuously for 28 day cycle for hormonal replacement   nortriptyline (PAMELOR) 10 MG capsule Take 10 mg by mouth at bedtime.   rizatriptan (MAXALT) 10 MG tablet Take 10 mg by mouth as needed for migraine. May repeat in 2 hours if needed   azithromycin (ZITHROMAX Z-PAK) 250 MG tablet Take 2 tablets (500 mg) today, then 1 tablet (250 mg) for next 4 days. (Patient not taking: Reported on 06/24/2022)   hydrOXYzine (ATARAX) 10 MG tablet SMARTSIG:1-4 Tablet(s) By Mouth Every 6 Hours PRN (Patient not taking: Reported on 06/24/2022)   Facility-Administered Medications Prior to Visit  Medication Dose Route Frequency Provider   sodium chloride flush (NS) 0.9 % injection 10 mL  10 mL Intravenous PRN Creig Hines, MD   No Known Allergies   There is no immunization history on file for this patient.  Health  Maintenance  Topic Date Due   COVID-19 Vaccine (1) Never done   Hepatitis C Screening  Never done   DTaP/Tdap/Td (1 - Tdap) Never done   INFLUENZA VACCINE  05/24/2023 (Originally 09/24/2022)   PAP SMEAR-Modifier  07/21/2025   HIV Screening  Completed   HPV VACCINES  Aged Out    Patient Care Team: Debera Lat, Cordelia Poche as PCP - General (Physician Assistant) Patient, No Pcp Per (General Practice) Benita Gutter, RN as Oncology Nurse Navigator Creig Hines, MD as Consulting Physician (Oncology) Leida Lauth, MD as Referring Physician (Obstetrics) Marinell Blight Marcelyn Ditty, MD as Referring Physician (Radiation Oncology) Carmina Miller, MD as Consulting Physician (Radiation Oncology)  Review of Systems  All other systems reviewed and are negative.  Except see HPI  Objective    BP 129/87   Pulse 65   SpO2 100%   There is no height or weight on file to calculate BMI.   Physical Exam Vitals reviewed.  Constitutional:      General: She is not in acute distress.    Appearance: Normal appearance. She is well-developed. She is not diaphoretic.  HENT:     Head: Normocephalic and atraumatic.  Eyes:     General: No scleral icterus.    Conjunctiva/sclera: Conjunctivae normal.  Neck:     Thyroid: No thyromegaly.  Cardiovascular:     Rate and Rhythm: Normal rate and regular rhythm.     Pulses: Normal pulses.     Heart sounds: Normal heart sounds. No murmur heard. Pulmonary:     Effort: Pulmonary effort is normal. No respiratory distress.     Breath sounds: Normal breath sounds. No wheezing, rhonchi or rales.  Musculoskeletal:     Cervical back: Neck supple.     Right lower leg: No edema.     Left lower leg: No edema.  Lymphadenopathy:     Cervical: No cervical adenopathy.  Skin:    General: Skin is warm and dry.     Findings: No rash.  Neurological:     Mental Status: She is alert and oriented to person, place, and time. Mental status is at baseline.  Psychiatric:         Behavior: Behavior normal.        Thought Content: Thought content normal.        Judgment: Judgment normal.     Depression Screen    10/20/2022    3:58 PM  PHQ 2/9 Scores  PHQ - 2 Score 0   No results found for any visits on 10/20/22.  Assessment & Plan         History of Cervical Cancer In remission following chemotherapy and radiation. No current issues reported. -Continue follow-up with oncologist Dr. Smith Robert every six months and radiational oncology every three-four months   Recurrent Sinusitis Reports 2-4 episodes per year requiring antibiotics. -Consider referral to ENT if infections persist or increase in frequency.  Obesity Chronic and worseing BMI 32.8 despite regular exercise and balanced diet. No history of diabetes, cholesterol issues, or hypertension. -Order labs including lipids, A1c, and TSH. -Refer to weight loss program. -Check insurance coverage for weight loss medication (wegovy or Saxenda). -Plan for follow-up in one month to discuss lab results and potential medication initiation.  Constipation Chronic Likely secondary to hormone replacement therapy. Advised regular exercise -Advise increased dietary fiber and probiotics. -Consider prune juice or apricots as needed, being mindful of added sugars. Pt was advised: -Eliminate medications that cause constipation. -Increase fluid intake. -Increase soluble fiber (25-30g/day) in diet. -Encourage regular defecation attempts after eating. -Regular exercise -Enemas if other methods fail- Bulking agents (accompanied by adequate fluids)  Psyllium  (Konsyl, Metamucil, Perdiem Fiber): 1 tbsp (approx 3.5 grams fiber) in 8-oz liquid PO daily up to TID Pt instructions/high-fiber eating plan were provided   General Health Maintenance -Declined flu and tetanus vaccines at this time.      Encounter to establish care Welcomed to our clinic Reviewed past medical hx, social hx, family hx and surgical hx Pt  advised to send all vaccination records or screening   Return in about 4 weeks (around 11/17/2022) for CPE.    The patient was advised to call back or seek an in-person evaluation if the symptoms worsen or if the condition fails to  improve as anticipated.  I discussed the assessment and treatment plan with the patient. The patient was provided an opportunity to ask questions and all were answered. The patient agreed with the plan and demonstrated an understanding of the instructions.  I, Debera Lat, PA-C have reviewed all documentation for this visit. The documentation on  10/20/22 for the exam, diagnosis, procedures, and orders are all accurate and complete.  Debera Lat, [facility], MMS [facility] [phone] (phone) [phone] (fax)  [facility]

## 2022-10-21 ENCOUNTER — Telehealth: Payer: Self-pay | Admitting: Physician Assistant

## 2022-10-21 LAB — LIPID PANEL
Chol/HDL Ratio: 4 ratio (ref 0.0–4.4)
Cholesterol, Total: 179 mg/dL (ref 100–199)
HDL: 45 mg/dL (ref >39)
LDL Chol Calc (NIH): 112 mg/dL — ABNORMAL HIGH (ref 0–99)
Triglycerides: 122 mg/dL (ref 0–149)
VLDL Cholesterol Cal: 22 mg/dL (ref 5–40)

## 2022-10-21 LAB — HEMOGLOBIN A1C
Est. average glucose Bld gHb Est-mCnc: 97 mg/dL
Hgb A1c MFr Bld: 5 % (ref 4.8–5.6)

## 2022-10-21 LAB — TSH: TSH: 1.35 u[IU]/mL (ref 0.450–4.500)

## 2022-10-21 NOTE — Telephone Encounter (Signed)
[facility] pharmacy is requesting prior authorization Key: [id number] Name: Dawn Camacho 0.25mg /0.5ML auto injectors

## 2022-10-24 ENCOUNTER — Encounter: Payer: Self-pay | Admitting: Physician Assistant

## 2022-10-28 NOTE — Telephone Encounter (Signed)
Apologies, but our team doesn't currently handle PA's for this office. Rerouting back to pool, thank you.

## 2022-10-30 NOTE — Telephone Encounter (Signed)
Your prior authorization for Dawn Camacho has been approved! More Info Personalized support and financial assistance may be available through the [employer] program. For more information, and to see program requirements, click on the More Info button to the right.  Message from plan: The case has been Approved [id number]. Authorization Expiration Date: October 30, 2023.

## 2022-10-30 NOTE — Telephone Encounter (Signed)
Prior Authorization send to plan. Waiting on the outcome.

## 2022-11-04 DIAGNOSIS — H53149 Visual discomfort, unspecified: Secondary | ICD-10-CM | POA: Diagnosis not present

## 2022-11-04 DIAGNOSIS — F40298 Other specified phobia: Secondary | ICD-10-CM | POA: Diagnosis not present

## 2022-11-04 DIAGNOSIS — Z8541 Personal history of malignant neoplasm of cervix uteri: Secondary | ICD-10-CM | POA: Diagnosis not present

## 2022-11-04 DIAGNOSIS — G43009 Migraine without aura, not intractable, without status migrainosus: Secondary | ICD-10-CM | POA: Diagnosis not present

## 2022-11-11 ENCOUNTER — Inpatient Hospital Stay: Payer: BC Managed Care – PPO | Attending: Oncology | Admitting: Obstetrics and Gynecology

## 2022-11-11 ENCOUNTER — Ambulatory Visit: Payer: BC Managed Care – PPO

## 2022-11-11 VITALS — BP 144/91 | HR 64 | Temp 97.6°F | Resp 20 | Wt 202.4 lb

## 2022-11-11 DIAGNOSIS — Z923 Personal history of irradiation: Secondary | ICD-10-CM | POA: Insufficient documentation

## 2022-11-11 DIAGNOSIS — Z8541 Personal history of malignant neoplasm of cervix uteri: Secondary | ICD-10-CM | POA: Diagnosis not present

## 2022-11-11 DIAGNOSIS — Z452 Encounter for adjustment and management of vascular access device: Secondary | ICD-10-CM | POA: Diagnosis not present

## 2022-11-11 DIAGNOSIS — C539 Malignant neoplasm of cervix uteri, unspecified: Secondary | ICD-10-CM

## 2022-11-11 DIAGNOSIS — E28319 Asymptomatic premature menopause: Secondary | ICD-10-CM | POA: Diagnosis not present

## 2022-11-11 DIAGNOSIS — Z9221 Personal history of antineoplastic chemotherapy: Secondary | ICD-10-CM | POA: Insufficient documentation

## 2022-11-11 DIAGNOSIS — Z79890 Hormone replacement therapy (postmenopausal): Secondary | ICD-10-CM | POA: Diagnosis not present

## 2022-11-11 DIAGNOSIS — Z08 Encounter for follow-up examination after completed treatment for malignant neoplasm: Secondary | ICD-10-CM | POA: Insufficient documentation

## 2022-11-11 NOTE — Progress Notes (Signed)
Gynecologic Oncology Interval Visit   Referring Provider: Dr Dalbert Garnet  Chief Concern: cervical cancer visit for biopsy  Subjective:  KAYLENE SHADDOCK is a 32 y.o. G0 female who is seen in consultation from Dr Dalbert Garnet for cervical cancer.  Almost no bleeding.  No other complaints.    09/23/2022  Endocervix, curettage - HIGH-GRADE SQUAMOUS INTRAEPITHELIAL LESION (HSIL).  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 The specimen was processed as a cell block due to the limited amount of material on gross examination. Sections demonstrate an extremely scant specimen with rare atypical cells that are p16 positive. Deeper sections were examined. 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 Features of superimposed radiation therapy effect are noted. Stain controls worked appropriately.  She presents today to review these results and discussed next steps. She continues the LOESTRIN and feels she is doing much better on that OCP. She did not start or receive a prescription for estrogen.   Gynecologic Oncology History JHADE KIRKENDOLL is a pleasant y.o. G0 female who is seen in consultation from Dr. Dr Dalbert Garnet for cervical cancer.  05/08/21 seen by Midwife for post coital spotting x 1.5 months. Cervical exam: Large, pink, friable and highly vascularized, non-tender, not smooth, irregular cervical growth extending from 11-5.   PAP ASCUS-H/HPV+. Patient's last menstrual period was 04/29/2021.    Colposcopy confirmed irregular cervical growth extending from 11-5:00. 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   06/12/2021 MRI Pelvis IMPRESSION: 1. 4.2 x 3.7 x 2.9 cm cervical mass involving the anterior aspect of the cervix and also likely involving the anterior aspect of the lower uterine segment. This extends into the vagina mainly on the left  side. No MR findings to suggest involvement of the bladder, urethra rectum. 2. Borderline enlarged bilateral pelvic sidewall lymph nodes worrisome for metastatic disease.  06/19/2021 PET scan IMPRESSION: 1. Hypermetabolic activity of the cervix, compatible with primary malignancy. 2. Hypermetabolic subcentimeter bilateral external iliac lymph nodes, concerning for metastatic disease. 3. Mildly hypermetabolic left inguinal lymph node, concerning for additional site of metastatic disease. 4. Bilateral nonobstructing renal stones.  06/30/2021 Port placement  11/19/2021 PET scan IMPRESSION: 1. Complete metabolic response to therapy of cervical primary and pelvic nodal metastasis. 2. Bilateral nephrolithiasis.  60Gy to cervix and nodes at [facility] under the care of Dr. Rushie Chestnut in 07/10/2021-08/29/2021. T&O at [facility] with Dr. Marinell Blight.  7/10 - 09/15/21 Cervix, T&O/R Total Dose 2400 cGy (each fraction 600 cGy)  Post treatment PET 04/09/22 was negative.   12/17/2021 Dilator teaching to avoid vaginal stenosis, performed at [facility].   Married and used Paediatric nurse for contraception.  Has not had a strong desire for children, but not sure for certain.   Had 2 Gardesil vaccinations, but then last in the series was not done due to recall or some other problem.  06/24/22- HIV negative      02/24/2022 Brain MRI performed by Neurology for history of headaches IMPRESSION: Borderline Chiari 1 with slight crowding of the craniocervical junction. Otherwise, normal brain MRI.  04/09/2022 PET  IMPRESSION: No evidence of hypermetabolic recurrent or metastatic disease.  06/24/22 for surveillance. Pap was obtained but was insufficient for testing. Additionally, she had post coital spotting. Granulation tissue on cervix was observed and thought to be etiology. She was treated with silver nitrate. HPV was positive. It was recollected on 07/22/22 and reported as EPCA, ASCUS-H, HR HPV  Positive.   08/05/22 colposcopy and biopsy with Dr  Sonia Side.  Colposcopy of the upper vagina and cervix was performed after application os acetic acid. The findings revealed cervical erythema and most prominent at 5-6 o'clock. A slightly raised friable area on the left vagina around 5 o'clock by the cervix. WE lateral left wall. The transformation zone was/was not seen.  .  CERVIX, 6:00; BIOPSY:  - INFLAMED GRANULATION TISSUE AND SEPARATE FRAGMENTS OF  NECROINFLAMMATORY MATERIAL.  - AN EPITHELIAL SURFACE IS NOT PRESENT.  - STROMAL CHANGES CONSISTENT WITH PRIOR RADIATION THERAPY.  - NEGATIVE FOR DYSPLASIA AND MALIGNANCY.  Recommended RTC for ECC and possible additional biopsies.    MRI 10/06/22 FINDINGS: Urinary Tract:  Bladder is within normal limits.   Bowel:  Visualized bowel is grossly unremarkable.   Vascular/Lymphatic: No evidence of aneurysm.   No suspicious pelvic lymphadenopathy.   Reproductive: No convincing residual cervical mass. Mild hypoperfusion along the right lateral aspect of the lower uterine segment/cervix on delayed imaging (series 26/image 40), but without associated restricted diffusion or other findings suggestive tumor.   Bilateral ovaries are within normal limits.   Other:  Small volume pelvic ascites.   Musculoskeletal: No focal osseous lesions.   IMPRESSION: No MR findings to suggest cervical cancer recurrence.   No evidence of metastatic disease in the pelvis.  PET scan 10/14/22 FINDINGS: Mediastinal blood pool activity: SUV max 2.8   Liver activity: SUV max NA   NECK: No hypermetabolic cervical lymphadenopathy.   Incidental CT findings: None.   CHEST: No hypermetabolic thoracic lymphadenopathy.   No suspicious pulmonary nodules.   Right chest port terminates at the cavoatrial junction.   Incidental CT findings: None.   ABDOMEN/PELVIS: No abnormal hypermetabolism in the liver, spleen, pancreas, or adrenal glands.   No hypermetabolic abdominopelvic lymphadenopathy. Small  bilateral inguinal nodes, likely reactive.   No focal cervical hypermetabolism to suggest recurrence on PET.   Incidental CT findings: Five nonobstructing right renal calculi measuring up to 5 mm. No hydronephrosis. Trace pelvic fluid/ascites.   SKELETON: No focal hypermetabolic activity to suggest skeletal metastasis.   Incidental CT findings: None.   IMPRESSION: No findings suspicious for recurrent or metastatic disease.   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 07/22/2022   Cervical cancer, FIGO stage III (HCC) 06/24/2021   Cervical cancer (HCC) 06/11/2021   Dyshydrosis 11/21/2015    Past Medical History: Past Medical History:  Diagnosis Date   Allergic rhinitis    Allergy    Anxiety 2016   Cervical cancer (HCC)     Past Surgical History: Past Surgical History:  Procedure Laterality Date   IR IMAGING GUIDED PORT INSERTION  06/30/2021   TONSILECTOMY, ADENOIDECTOMY, BILATERAL MYRINGOTOMY AND TUBES Bilateral    TONSILLECTOMY     WISDOM TOOTH EXTRACTION       Family History: Family History  Problem Relation Age of Onset   Depression Mother    Hearing loss Mother    Kidney disease Mother    Miscarriages / [place] Mother    Obesity Mother    Heart disease Father    ADD / ADHD Father    Breast cancer  Maternal Grandmother    Lung cancer Paternal Grandmother    Cancer Paternal Grandmother    COPD Paternal Grandmother    Heart attack Paternal Grandfather    ADD / ADHD Brother     Social History: Social History   Socioeconomic History   Marital status: Married    Spouse name: Not  on file   Number of children: Not on file   Years of education: Not on file   Highest education level: Associate degree: academic program  Occupational History   Not on file  Tobacco Use   Smoking status: Never   Smokeless tobacco: Never  Vaping Use   Vaping status: Never Used  Substance and Sexual Activity   Alcohol use: Yes    Alcohol/week: 1.0 standard drink of alcohol    Types: 1 Glasses of wine per week    Comment: occasional--every couple of weeks.   Drug use: No   Sexual activity: Yes    Birth control/protection: None  Other Topics Concern   Not on file  Social History Narrative   Not on file   Social Determinants of Health   Financial Resource Strain: Low Risk  (10/19/2022)   Overall Financial Resource Strain (CARDIA)    Difficulty of Paying Living Expenses: Not hard at all  Food Insecurity: No Food Insecurity (10/19/2022)   Hunger Vital Sign    Worried About Running Out of Food in the Last Year: Never true    Ran Out of Food in the Last Year: Never true  Transportation Needs: No Transportation Needs (10/19/2022)   PRAPARE - Administrator, Civil Service (Medical): No    Lack of Transportation (Non-Medical): No  Physical Activity: Insufficiently Active (10/19/2022)   Exercise Vital Sign    Days of Exercise per Week: 2 days    Minutes of Exercise per Session: 30 min  Stress: No Stress Concern Present (10/19/2022)   [employer] of Occupational Health - Occupational Stress Questionnaire    Feeling of Stress : Only a little  Social Connections: Moderately Isolated (10/19/2022)   Social Connection and Isolation Panel [NHANES]    Frequency of Communication with Friends and Family: More than three times a week    Frequency of Social Gatherings with Friends and Family: Three times a week    Attends Religious Services: Never    Active Member of Clubs or Organizations: No    Attends Engineer, structural: Not on file    Marital Status: Married   Catering manager Violence: Not on file    Allergies: No Known Allergies  Current Medications: Current Outpatient Medications  Medication Sig Dispense Refill   aspirin-acetaminophen-caffeine (EXCEDRIN MIGRAINE) 250-250-65 MG tablet Take by mouth every 6 (six) hours as needed for headache.     azithromycin (ZITHROMAX Z-PAK) 250 MG tablet Take 2 tablets (500 mg) today, then 1 tablet (250 mg) for next 4 days. (Patient not taking: Reported on 06/24/2022) 6 tablet 0   hydrOXYzine (ATARAX) 10 MG tablet SMARTSIG:1-4 Tablet(s) By Mouth Every 6 Hours PRN (Patient not taking: Reported on 06/24/2022)     lidocaine (XYLOCAINE) 5 % ointment Apply 1 Application topically as needed (Bring to appointment for biopsy.). 35.44 g 0   lidocaine-prilocaine (EMLA) cream APPLY TOPICALLY TO THE AFFECTED AREA 1 TIME 30 g 3   Norethindrone-Ethinyl Estradiol-Fe Biphas (LO LOESTRIN FE) 1 MG-10 MCG / 10 MCG tablet Take 1 tablet by mouth daily. Take pills continuously for 28 day cycle for hormonal replacement 28 tablet 11  nortriptyline (PAMELOR) 10 MG capsule Take 10 mg by mouth at bedtime.     rizatriptan (MAXALT) 10 MG tablet Take 10 mg by mouth as needed for migraine. May repeat in 2 hours if needed     Semaglutide-Weight Management 0.25 MG/0.5ML SOAJ Inject 0.25 mg into the skin once a week. 2 mL 1   No current facility-administered medications for this visit.   Facility-Administered Medications Ordered in Other Visits  Medication Dose Route Frequency Provider Last Rate Last Admin   sodium chloride flush (NS) 0.9 % injection 10 mL  10 mL Intravenous PRN Creig Hines, MD   10 mL at 07/07/21 0841   Review of Systems General:  no complaints Skin: no complaints Eyes: no complaints HEENT: no complaints Breasts: no complaints Pulmonary: no complaints Cardiac: no complaints Gastrointestinal: no complaints Genitourinary/Sexual: no complaints Ob/Gyn: no complaints Musculoskeletal: no complaints Hematology: no  complaints Neurologic/Psych: no complaints   Objective:  Physical Examination:  BP (!) 144/91   Pulse 64   Temp 97.6 F (36.4 C)   Resp 20   Wt 202 lb 6.4 oz (91.8 kg)   SpO2 100%   BMI 32.67 kg/m    ECOG Performance Status: 0 - Asymptomatic  GENERAL: Patient is a well appearing female in no acute distress HEENT: Atraumatic normocephalic.  LUNGS:  Nornal respiratory effort.  ABDOMEN:  Soft, nontender. Nondistended.  No masses or ascites EXTREMITIES:  No peripheral edema.   NEURO:  Nonfocal. Well oriented.  Appropriate affect.  Pelvic: Chaperoned with CMA EGBUS: no lesions Cervix: biopsy site almost completely healed. The os is not clearly seen.  The cervix is flush with the vagina.  Firm to palpation Vagina: no lesions, no discharge or bleeding Uterus: not grossly enlarged; seems to be normal size but exam limited, nontender, mobile BME: no palpable masses, parametria is smooth and there is no nodularity Rectovaginal: deferred.    Lab Review No labs on site today  Assessment:  Chelbi Duncil Fugate is a 32 y.o. 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    Vaginal bleeding 2/24 and PET/CT negative.  Abnormal Pap with ASC-H and HRHPV positive 5/24.  Colposcopic vaginal biopsy 6/24 showed just inflammation.  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  Pelvic MRI and PET scan 8/24 negative for recurrence.  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   Port-a-cath in place  Peripheral neuropathy has resolved.   Medical co-morbidities complicating care: anxiety.  Plan:   Problem List Items Addressed This Visit       Genitourinary   Cervical cancer (HCC) - Primary    Discussed that with PET and MRI negative for cancer in the cervix would recommend close surveillance with exams q2 months.  LEEP/[facility] would be technically  very challenging and the cellular changes on cytology and biopsy may be reactive due to radiation.  Hysterectomy would be an option if there is more concern for recurrence in the cervix, but risk of complications including fistula are significant post full radiation for cervical cancer. Will see her back in 2 months of sooner if concerning symptoms.    Continue LOESTRIN. Bone Health-  calcium 1200 mg and vitamin D 1000 iu daily for prevention of bone loss. Encouraged weight bearing exercise life long.   Vaginal Health- post radiation, she is at risk of vaginal atrophy and agglutination. We previously reviewed importance  of keeping vagina open for surveillance of malignancy and sexual health. She will continue dilator use. May require use of vaginal lubricants.   IV access: port a cath was placed for administration of chemotherapy.  Can consider removal at any time.   Cancer screenings and wellness- she will continue to see her pcp for cancer screenings and ongoing wellness.   The patient's diagnosis, an outline of the further diagnostic and laboratory studies which will be required, the recommendation, and alternatives were discussed.  All questions were answered to the patient's satisfaction.  Leida Lauth, MD

## 2022-11-19 ENCOUNTER — Ambulatory Visit: Payer: BC Managed Care – PPO | Admitting: Physician Assistant

## 2022-11-20 ENCOUNTER — Inpatient Hospital Stay: Payer: BC Managed Care – PPO

## 2022-11-20 DIAGNOSIS — Z8541 Personal history of malignant neoplasm of cervix uteri: Secondary | ICD-10-CM | POA: Diagnosis not present

## 2022-11-20 DIAGNOSIS — Z95828 Presence of other vascular implants and grafts: Secondary | ICD-10-CM

## 2022-11-20 DIAGNOSIS — Z08 Encounter for follow-up examination after completed treatment for malignant neoplasm: Secondary | ICD-10-CM | POA: Diagnosis not present

## 2022-11-20 DIAGNOSIS — Z79890 Hormone replacement therapy (postmenopausal): Secondary | ICD-10-CM | POA: Diagnosis not present

## 2022-11-20 DIAGNOSIS — E28319 Asymptomatic premature menopause: Secondary | ICD-10-CM | POA: Diagnosis not present

## 2022-11-20 DIAGNOSIS — Z923 Personal history of irradiation: Secondary | ICD-10-CM | POA: Diagnosis not present

## 2022-11-20 DIAGNOSIS — Z452 Encounter for adjustment and management of vascular access device: Secondary | ICD-10-CM | POA: Diagnosis not present

## 2022-11-20 DIAGNOSIS — Z9221 Personal history of antineoplastic chemotherapy: Secondary | ICD-10-CM | POA: Diagnosis not present

## 2022-11-20 MED ORDER — SODIUM CHLORIDE 0.9% FLUSH
10.0000 mL | Freq: Once | INTRAVENOUS | Status: AC
  Administered 2022-11-20: 10 mL via INTRAVENOUS
  Filled 2022-11-20: qty 10

## 2022-11-20 MED ORDER — HEPARIN SOD (PORK) LOCK FLUSH 100 UNIT/ML IV SOLN
500.0000 [IU] | Freq: Once | INTRAVENOUS | Status: AC
  Administered 2022-11-20: 500 [IU] via INTRAVENOUS
  Filled 2022-11-20: qty 5

## 2022-11-22 NOTE — Progress Notes (Unsigned)
Complete physical exam  Patient: Dawn Camacho   DOB: 10-07-1990   32 y.o. Female  MRN: [id number] Visit Date: 11/27/2022  Today's healthcare provider: Debera Lat, PA-C   Chief Complaint  Patient presents with   Medical Management of Chronic Issues    Follow up from wegovy, patient started her 2nd week this past wednesday   Subjective    Dawn Camacho is a 32 y.o. female who presents today for a complete physical exam.  She reports consuming a {diet types:17450} diet. {Exercise:19826} She generally feels {well/fairly well/poorly:18703}. She reports sleeping {well/fairly well/poorly:18703}. She {does/does not:200015} have additional problems to discuss today.  HPI HPI     Medical Management of Chronic Issues    Additional comments: Follow up from wegovy, patient started her 2nd week this past wednesday      Last edited by Rolly Salter, CMA on 11/27/2022  1:10 PM.      *** Discussed the use of AI scribe software for clinical note transcription with the patient, who gave verbal consent to proceed.  History of Present Illness   The patient, with a history of cervical cancer and currently on hormone replacement therapy, presents with concerns about when to start mammograms due to a family history of breast cancer. She is also on [facility] for weight loss and reports a loss of about 4-5 pounds within the first week of treatment. She is actively trying to increase her exercise, going to the gym three times a week and walking around her neighborhood. She has been experiencing some constipation, which she attributes to her hormone replacement therapy. She also reports some seasonal allergies.        Last depression screening scores    11/27/2022    1:16 PM 10/20/2022    3:58 PM  PHQ 2/9 Scores  PHQ - 2 Score 0 0  PHQ- 9 Score 3    Last fall risk screening    10/20/2022    3:58 PM  Fall Risk   Falls in the past year? 0  Injury with Fall? 0  Risk for fall due  to : No Fall Risks  Follow up Falls evaluation completed   Last Audit-C alcohol use screening    10/19/2022    5:09 PM  Alcohol Use Disorder Test (AUDIT)  1. How often do you have a drink containing alcohol? 2  2. How many drinks containing alcohol do you have on a typical day when you are drinking? 1  3. How often do you have six or more drinks on one occasion? 1  AUDIT-C Score 4  4. How often during the last year have you found that you were not able to stop drinking once you had started? 0  5. How often during the last year have you failed to do what was normally expected from you because of drinking? 0  6. How often during the last year have you needed a first drink in the morning to get yourself going after a heavy drinking session? 0  7. How often during the last year have you had a feeling of guilt of remorse after drinking? 0  8. How often during the last year have you been unable to remember what happened the night before because you had been drinking? 0  9. Have you or someone else been injured as a result of your drinking? 0  10. Has a relative or friend or a doctor or another health worker been concerned about  your drinking or suggested you cut down? 0  Alcohol Use Disorder Identification Test Final Score (AUDIT) 4   A score of 3 or more in women, and 4 or more in men indicates increased risk for alcohol abuse, EXCEPT if all of the points are from question 1   Past Medical History:  Diagnosis Date   Allergic rhinitis    Allergy    Anxiety 2016   Cervical cancer (HCC)    Past Surgical History:  Procedure Laterality Date   IR IMAGING GUIDED PORT INSERTION  06/30/2021   TONSILECTOMY, ADENOIDECTOMY, BILATERAL MYRINGOTOMY AND TUBES Bilateral    TONSILLECTOMY     WISDOM TOOTH EXTRACTION     Social History   Socioeconomic History   Marital status: Married    Spouse name: Not on file   Number of children: Not on file   Years of education: Not on file   Highest education  level: Associate degree: academic program  Occupational History   Not on file  Tobacco Use   Smoking status: Never   Smokeless tobacco: Never  Vaping Use   Vaping status: Never Used  Substance and Sexual Activity   Alcohol use: Yes    Alcohol/week: 1.0 standard drink of alcohol    Types: 1 Glasses of wine per week    Comment: occasional--every couple of weeks.   Drug use: No   Sexual activity: Yes    Birth control/protection: None  Other Topics Concern   Not on file  Social History Narrative   Not on file   Social Determinants of Health   Financial Resource Strain: Low Risk  (10/19/2022)   Overall Financial Resource Strain (CARDIA)    Difficulty of Paying Living Expenses: Not hard at all  Food Insecurity: No Food Insecurity (10/19/2022)   Hunger Vital Sign    Worried About Running Out of Food in the Last Year: Never true    Ran Out of Food in the Last Year: Never true  Transportation Needs: No Transportation Needs (10/19/2022)   PRAPARE - Administrator, Civil Service (Medical): No    Lack of Transportation (Non-Medical): No  Physical Activity: Insufficiently Active (10/19/2022)   Exercise Vital Sign    Days of Exercise per Week: 2 days    Minutes of Exercise per Session: 30 min  Stress: No Stress Concern Present (10/19/2022)   [employer] of Occupational Health - Occupational Stress Questionnaire    Feeling of Stress : Only a little  Social Connections: Moderately Isolated (10/19/2022)   Social Connection and Isolation Panel [NHANES]    Frequency of Communication with Friends and Family: More than three times a week    Frequency of Social Gatherings with Friends and Family: Three times a week    Attends Religious Services: Never    Active Member of Clubs or Organizations: No    Attends Engineer, structural: Not on file    Marital Status: Married  Catering manager Violence: Not on file   Family Status  Relation Name Status   Mother Ginger Alive    Father Thommy Reichardt Alive   MGM  (Not Specified)   PGM Pearletha Alfred (Not Specified)   PGF  (Not Specified)   Brother Runner, broadcasting/film/video (Not Specified)  No partnership data on file   Family History  Problem Relation Age of Onset   Depression Mother    Hearing loss Mother    Kidney disease Mother    Miscarriages / [place] Mother    Obesity  Mother    Heart disease Father    ADD / ADHD Father    Breast cancer Maternal Grandmother    Lung cancer Paternal Grandmother    Cancer Paternal Grandmother    COPD Paternal Grandmother    Heart attack Paternal Grandfather    ADD / ADHD Brother    No Known Allergies  Patient Care Team: Debera Lat, PA-C as PCP - General (Physician Assistant) Patient, No Pcp Per (General Practice) Benita Gutter, RN as Oncology Nurse Navigator Creig Hines, MD as Consulting Physician (Oncology) Leida Lauth, MD as Referring Physician (Obstetrics) Marinell Blight, Marcelyn Ditty, MD as Referring Physician (Radiation Oncology) Carmina Miller, MD as Consulting Physician (Radiation Oncology)   Medications: Outpatient Medications Prior to Visit  Medication Sig   aspirin-acetaminophen-caffeine (EXCEDRIN MIGRAINE) [phone] MG tablet Take by mouth every 6 (six) hours as needed for headache.   lidocaine (XYLOCAINE) 5 % ointment Apply 1 Application topically as needed (Bring to appointment for biopsy.).   lidocaine-prilocaine (EMLA) cream APPLY TOPICALLY TO THE AFFECTED AREA 1 TIME   Norethindrone-Ethinyl Estradiol-Fe Biphas (LO LOESTRIN FE) 1 MG-10 MCG / 10 MCG tablet Take 1 tablet by mouth daily. Take pills continuously for 28 day cycle for hormonal replacement   rizatriptan (MAXALT) 10 MG tablet Take 10 mg by mouth as needed for migraine. May repeat in 2 hours if needed   Semaglutide-Weight Management 0.25 MG/0.5ML SOAJ Inject 0.25 mg into the skin once a week.   venlafaxine (EFFEXOR) 37.5 MG tablet Start taking Effexor 37.5 mg daily for one week, then increase to  37.5 mg twice per day for headaches.   [DISCONTINUED] azithromycin (ZITHROMAX Z-PAK) 250 MG tablet Take 2 tablets (500 mg) today, then 1 tablet (250 mg) for next 4 days. (Patient not taking: Reported on 06/24/2022)   [DISCONTINUED] hydrOXYzine (ATARAX) 10 MG tablet SMARTSIG:1-4 Tablet(s) By Mouth Every 6 Hours PRN (Patient not taking: Reported on 06/24/2022)   [DISCONTINUED] nortriptyline (PAMELOR) 10 MG capsule Take 10 mg by mouth at bedtime. (Patient not taking: Reported on 11/27/2022)   Facility-Administered Medications Prior to Visit  Medication Dose Route Frequency Provider   sodium chloride flush (NS) 0.9 % injection 10 mL  10 mL Intravenous PRN Creig Hines, MD    Review of Systems  All other systems reviewed and are negative.  Except see HPI  {Insert previous labs (optional):23779} {See past labs  Heme  Chem  Endocrine  Serology  Results Review (optional):1}  Objective    BP 115/84 (BP Location: Left Arm, Patient Position: Sitting, Cuff Size: Large)   Pulse 75   Ht 5\' 6"  (1.676 m)   Wt 203 lb (92.1 kg)   SpO2 100%   BMI 32.77 kg/m  {Insert last BP/Wt (optional):23777}{See vitals history (optional):1}    Physical Exam Vitals reviewed.  Constitutional:      General: She is not in acute distress.    Appearance: Normal appearance. She is well-developed. She is not ill-appearing, toxic-appearing or diaphoretic.  HENT:     Head: Normocephalic and atraumatic.     Right Ear: Tympanic membrane, ear canal and external ear normal.     Left Ear: Tympanic membrane, ear canal and external ear normal.     Nose: Nose normal. No congestion or rhinorrhea.     Mouth/Throat:     Mouth: Mucous membranes are moist.     Pharynx: Oropharynx is clear. No oropharyngeal exudate.  Eyes:     General: No scleral icterus.  Right eye: No discharge.        Left eye: No discharge.     Conjunctiva/sclera: Conjunctivae normal.     Pupils: Pupils are equal, round, and reactive to light.   Neck:     Thyroid: No thyromegaly.     Vascular: No carotid bruit.  Cardiovascular:     Rate and Rhythm: Normal rate and regular rhythm.     Pulses: Normal pulses.     Heart sounds: Normal heart sounds. No murmur heard.    No friction rub. No gallop.  Pulmonary:     Effort: Pulmonary effort is normal. No respiratory distress.     Breath sounds: Normal breath sounds. No wheezing or rales.  Abdominal:     General: Abdomen is flat. Bowel sounds are normal. There is no distension.     Palpations: Abdomen is soft. There is no mass.     Tenderness: There is no abdominal tenderness. There is no right CVA tenderness, left CVA tenderness, guarding or rebound.     Hernia: No hernia is present.  Musculoskeletal:        General: No swelling, tenderness, deformity or signs of injury. Normal range of motion.     Cervical back: Normal range of motion and neck supple. No rigidity or tenderness.     Right lower leg: No edema.     Left lower leg: No edema.  Lymphadenopathy:     Cervical: No cervical adenopathy.  Skin:    General: Skin is warm and dry.     Coloration: Skin is not jaundiced or pale.     Findings: No bruising, erythema, lesion or rash.  Neurological:     Mental Status: She is alert and oriented to person, place, and time. Mental status is at baseline.     Gait: Gait normal.  Psychiatric:        Mood and Affect: Mood normal.        Behavior: Behavior normal.        Thought Content: Thought content normal.        Judgment: Judgment normal.      No results found for any visits on 11/27/22.  Assessment & Plan    Routine Health Maintenance and Physical Exam  Exercise Activities and Dietary recommendations  Goals   None      There is no immunization history on file for this patient.  Health Maintenance  Topic Date Due   COVID-19 Vaccine (1) Never done   Hepatitis C Screening  Never done   DTaP/Tdap/Td vaccine (1 - Tdap) Never done   Flu Shot  05/24/2023*   HIV  Screening  Completed   HPV Vaccine  Aged Out  *Topic was postponed. The date shown is not the original due date.    Discussed health benefits of physical activity, and encouraged her to engage in regular exercise appropriate for her age and condition.  Assessment and Plan    Family History of Breast Cancer Patient's maternal grandmother had breast cancer. Patient has a history of cervical cancer. No known genetic predisposition. -Recommended to discuss mammogram screening with her oncology GYN at her upcoming appointment.  Weight Management Patient has lost 4-5 pounds in the first week of using Wegovy 0.5mg . No significant GI side effects reported. Patient is also incorporating exercise and a low-carb diet. -Continue Wegovy 0.5mg  weekly. -Encouraged to maintain diet and exercise regimen. -Check A1C in two months.  Seasonal Allergies Patient reports seasonal allergies. Some fluid noted in ears. -Continue use  of Zyrtec or Claritin as needed. -Consider use of nasal saline rinse and spray.  Hyperlipidemia Cholesterol slightly elevated. -Continue low-carb diet and exercise. -Expect cholesterol to decrease with continued use of Wegovy and weight loss.  General Health Maintenance -Continue self-breast exams. -Consider tetanus shot at next visit. -Schedule follow-up appointment in two months for chronic conditions and in one year for physical. -Contact office in three weeks for [facility] refill.         ***  Return in about 2 months (around 01/27/2023) for chronic disease f/u and in year for CPE.    The patient was advised to call back or seek an in-person evaluation if the symptoms worsen or if the condition fails to improve as anticipated.  I discussed the assessment and treatment plan with the patient. The patient was provided an opportunity to ask questions and all were answered. The patient agreed with the plan and demonstrated an understanding of the instructions.  IDebera Lat, PA-C have 11/27/22  for the exam, diagnosis, procedures, and orders are all accurate and complete.  Debera Lat, [facility], MMS [facility] [phone] (phone) [phone] (fax)  [facility]

## 2022-11-27 ENCOUNTER — Encounter: Payer: Self-pay | Admitting: Physician Assistant

## 2022-11-27 ENCOUNTER — Ambulatory Visit: Payer: BC Managed Care – PPO | Admitting: Physician Assistant

## 2022-11-27 VITALS — BP 115/84 | HR 75 | Ht 66.0 in | Wt 203.0 lb

## 2022-11-27 DIAGNOSIS — Z0000 Encounter for general adult medical examination without abnormal findings: Secondary | ICD-10-CM

## 2022-11-27 DIAGNOSIS — E66811 Obesity, class 1: Secondary | ICD-10-CM | POA: Diagnosis not present

## 2022-11-27 DIAGNOSIS — Z0001 Encounter for general adult medical examination with abnormal findings: Secondary | ICD-10-CM

## 2022-12-15 ENCOUNTER — Emergency Department
Admission: EM | Admit: 2022-12-15 | Discharge: 2022-12-16 | Disposition: A | Discharge status: Home / Self Care | Payer: BC Managed Care – PPO | Attending: Emergency Medicine | Admitting: Emergency Medicine

## 2022-12-15 ENCOUNTER — Encounter: Payer: Self-pay | Admitting: *Deleted

## 2022-12-15 ENCOUNTER — Emergency Department: Payer: BC Managed Care – PPO

## 2022-12-15 ENCOUNTER — Other Ambulatory Visit: Payer: Self-pay

## 2022-12-15 DIAGNOSIS — Z8541 Personal history of malignant neoplasm of cervix uteri: Secondary | ICD-10-CM | POA: Diagnosis not present

## 2022-12-15 DIAGNOSIS — N132 Hydronephrosis with renal and ureteral calculous obstruction: Secondary | ICD-10-CM | POA: Diagnosis not present

## 2022-12-15 DIAGNOSIS — N200 Calculus of kidney: Secondary | ICD-10-CM | POA: Insufficient documentation

## 2022-12-15 DIAGNOSIS — R109 Unspecified abdominal pain: Secondary | ICD-10-CM | POA: Diagnosis not present

## 2022-12-15 LAB — URINALYSIS, ROUTINE W REFLEX MICROSCOPIC
Bacteria, UA: NONE SEEN
Bilirubin Urine: NEGATIVE
Glucose, UA: NEGATIVE mg/dL
Ketones, ur: NEGATIVE mg/dL
Leukocytes,Ua: NEGATIVE
Nitrite: NEGATIVE
Protein, ur: NEGATIVE mg/dL
RBC / HPF: >50 RBC/hpf (ref 0–5)
Specific Gravity, Urine: 1.013 (ref 1.005–1.030)
pH: 5 (ref 5.0–8.0)

## 2022-12-15 LAB — BASIC METABOLIC PANEL
Anion gap: 10 (ref 5–15)
BUN: 12 mg/dL (ref 6–20)
CO2: 23 mmol/L (ref 22–32)
Calcium: 9.7 mg/dL (ref 8.9–10.3)
Chloride: 104 mmol/L (ref 98–111)
Creatinine, Ser: 0.86 mg/dL (ref 0.44–1.00)
GFR, Estimated: >60 mL/min (ref >60)
Glucose, Bld: 92 mg/dL (ref 70–99)
Potassium: 3.7 mmol/L (ref 3.5–5.1)
Sodium: 137 mmol/L (ref 135–145)

## 2022-12-15 LAB — CBC
HCT: 40 % (ref 36.0–46.0)
Hemoglobin: 14.5 g/dL (ref 12.0–15.0)
MCH: 33.3 pg (ref 26.0–34.0)
MCHC: 36.3 g/dL — ABNORMAL HIGH (ref 30.0–36.0)
MCV: 92 fL (ref 80.0–100.0)
Platelets: 236 10*3/uL (ref 150–400)
RBC: 4.35 MIL/uL (ref 3.87–5.11)
RDW: 11.8 % (ref 11.5–15.5)
WBC: 5.3 10*3/uL (ref 4.0–10.5)
nRBC: 0 % (ref 0.0–0.2)

## 2022-12-15 LAB — POC URINE PREG, ED: Preg Test, Ur: NEGATIVE

## 2022-12-15 MED ORDER — MORPHINE SULFATE (PF) 4 MG/ML IV SOLN
4.0000 mg | Freq: Once | INTRAVENOUS | Status: AC
  Administered 2022-12-15: 4 mg via INTRAVENOUS
  Filled 2022-12-15: qty 1

## 2022-12-15 MED ORDER — KETOROLAC TROMETHAMINE 30 MG/ML IJ SOLN
30.0000 mg | Freq: Once | INTRAMUSCULAR | Status: AC
  Administered 2022-12-15: 30 mg via INTRAVENOUS
  Filled 2022-12-15: qty 1

## 2022-12-15 MED ORDER — SODIUM CHLORIDE 0.9 % IV BOLUS (SEPSIS)
1000.0000 mL | Freq: Once | INTRAVENOUS | Status: AC
  Administered 2022-12-15: 1000 mL via INTRAVENOUS

## 2022-12-15 MED ORDER — ONDANSETRON HCL 4 MG/2ML IJ SOLN
4.0000 mg | Freq: Once | INTRAMUSCULAR | Status: AC
  Administered 2022-12-15: 4 mg via INTRAVENOUS
  Filled 2022-12-15: qty 2

## 2022-12-15 NOTE — ED Provider Notes (Signed)
[facility] Provider Note    Event Date/Time   First MD Initiated Contact with Patient 12/15/22 2300     (approximate)   History   Flank Pain   HPI  Dawn Camacho is a 32 y.o. female with history of kidney stones who presents to the emergency department with sudden onset right flank and abdominal pain with nausea and vomiting tonight.  No dysuria, hematuria, fever.   History provided by patient, significant other.    Past Medical History:  Diagnosis Date   Allergic rhinitis    Allergy    Anxiety 2016   Cervical cancer [facility])     Past Surgical History:  Procedure Laterality Date   IR IMAGING GUIDED PORT INSERTION  06/30/2021   TONSILECTOMY, ADENOIDECTOMY, BILATERAL MYRINGOTOMY AND TUBES Bilateral    TONSILLECTOMY     WISDOM TOOTH EXTRACTION      MEDICATIONS:  Prior to Admission medications   Medication Sig Start Date End Date Taking? Authorizing Provider  aspirin-acetaminophen-caffeine (EXCEDRIN MIGRAINE) [phone] MG tablet Take by mouth every 6 (six) hours as needed for headache.    [provider]  lidocaine (XYLOCAINE) 5 % ointment Apply 1 Application topically as needed (Bring to appointment for biopsy.). 09/22/22   Alinda Dooms, NP  lidocaine-prilocaine (EMLA) cream APPLY TOPICALLY TO THE AFFECTED AREA 1 TIME 08/24/22   Borders, Daryl Eastern, NP  Norethindrone-Ethinyl Estradiol-Fe Biphas (LO LOESTRIN FE) 1 MG-10 MCG / 10 MCG tablet Take 1 tablet by mouth daily. Take pills continuously for 28 day cycle for hormonal replacement 07/13/22   Alinda Dooms, NP  rizatriptan (MAXALT) 10 MG tablet Take 10 mg by mouth as needed for migraine. May repeat in 2 hours if needed    [provider]  Semaglutide-Weight Management 0.25 MG/0.5ML SOAJ Inject 0.25 mg into the skin once a week. 10/20/22   Debera Lat, PA-C  venlafaxine (EFFEXOR) 37.5 MG tablet Start taking Effexor 37.5 mg daily for one week, then increase to 37.5 mg twice per  day for headaches. 11/04/22   [provider]    Physical Exam   Triage Vital Signs: ED Triage Vitals  Encounter Vitals Group     BP 12/15/22 2222 (!) 148/105     Systolic BP Percentile --      Diastolic BP Percentile --      Pulse Rate 12/15/22 2222 70     Resp 12/15/22 2222 18     Temp 12/15/22 2222 97.9 F (36.6 C)     Temp Source 12/15/22 2222 Oral     SpO2 12/15/22 2222 100 %     Weight 12/15/22 2222 200 lb (90.7 kg)     Height 12/15/22 2222 5\' 6"  (1.676 m)     Head Circumference --      Peak Flow --      Pain Score 12/15/22 2225 10     Pain Loc --      Pain Education --      Exclude from Growth Chart --     Most recent vital signs: Vitals:   12/15/22 2222  BP: (!) 148/105  Pulse: 70  Resp: 18  Temp: 97.9 F (36.6 C)  SpO2: 100%    CONSTITUTIONAL: Alert, responds appropriately to questions.  Appears uncomfortable HEAD: Normocephalic, atraumatic EYES: Conjunctivae clear, pupils appear equal, sclera nonicteric ENT: normal nose; moist mucous membranes NECK: Supple, normal ROM CARD: RRR; S1 and S2 appreciated RESP: Normal chest excursion without splinting or tachypnea; breath sounds  clear and equal bilaterally; no wheezes, no rhonchi, no rales, no hypoxia or respiratory distress, speaking full sentences ABD/GI: Non-distended; soft, tender in the right abdomen without guarding or rebound BACK: The back appears normal, no CVA tenderness EXT: Normal ROM in all joints; no deformity noted, no edema SKIN: Normal color for age and race; warm; no rash on exposed skin NEURO: Moves all extremities equally, normal speech PSYCH: The patient's mood and manner are appropriate.   ED Results / Procedures / Treatments   LABS: (all labs ordered are listed, but only abnormal results are displayed) Labs Reviewed  URINALYSIS, ROUTINE W REFLEX MICROSCOPIC - Abnormal; Notable for the following components:      Result Value   Color, Urine YELLOW (*)    APPearance CLEAR  (*)    Hgb urine dipstick LARGE (*)    All other components within normal limits  CBC - Abnormal; Notable for the following components:   MCHC 36.3 (*)    All other components within normal limits  BASIC METABOLIC PANEL  POC URINE PREG, ED     EKG:  EKG Interpretation Date/Time:    Ventricular Rate:    PR Interval:    QRS Duration:    QT Interval:    QTC Calculation:   R Axis:      Text Interpretation:           RADIOLOGY: My personal review and interpretation of imaging: CT scan shows right proximal ureteral stone.  I have personally reviewed all radiology reports.   CT Renal Stone Study  Result Date: 12/16/2022 CLINICAL DATA:  Right-sided flank pain for 1 hour, initial encounter EXAM: CT ABDOMEN AND PELVIS WITHOUT CONTRAST TECHNIQUE: Multidetector CT imaging of the abdomen and pelvis was performed following the standard protocol without IV contrast. RADIATION DOSE REDUCTION: This exam was performed according to the departmental dose-optimization program which includes automated exposure control, adjustment of the mA and/or kV according to patient size and/or use of iterative reconstruction technique. COMPARISON:  11/17/2020 FINDINGS: Lower chest: No acute abnormality. Hepatobiliary: No focal liver abnormality is seen. No gallstones, gallbladder wall thickening, or biliary dilatation. Pancreas: Unremarkable. No pancreatic ductal dilatation or surrounding inflammatory changes. Spleen: Normal in size without focal abnormality. Adrenals/Urinary Tract: Adrenal glands are within normal limits. Left kidney shows punctate lower pole renal stone without obstructive change. Right kidney demonstrates moderate hydronephrosis secondary to a 4 mm stone in the proximal right ureter. Multiple nonobstructing right renal stones are noted as well measuring up to 4 mm. More distal right ureter is unremarkable. The bladder is decompressed. Stomach/Bowel: Stomach is within normal limits. Appendix  appears normal. No evidence of bowel wall thickening, distention, or inflammatory changes. Vascular/Lymphatic: No significant vascular findings are present. No enlarged abdominal or pelvic lymph nodes. Reproductive: Uterus and bilateral adnexa are unremarkable. Other: No abdominal wall hernia or abnormality. No abdominopelvic ascites. Musculoskeletal: No acute or significant osseous findings. IMPRESSION: 4 mm proximal right ureteral stone with obstructive change. Bilateral nonobstructing renal calculi as described. Electronically Signed   By: Alcide Clever M.D.   On: 12/16/2022 00:40     PROCEDURES:  Critical Care performed: No     Procedures    IMPRESSION / MDM / ASSESSMENT AND PLAN / ED COURSE  I reviewed the triage vital signs and the nursing notes.    Pt here with sudden onset right flank pain.  The patient is on the cardiac monitor to evaluate for evidence of arrhythmia and/or significant heart rate changes.  DIFFERENTIAL DIAGNOSIS (includes but not limited to):   Kidney stone, pyelonephritis, ascending UTI, appendicitis, cholecystitis, cholelithiasis, ovarian torsion   Patient's presentation is most consistent with acute presentation with potential threat to life or bodily function.   PLAN: Will obtain labs, urine, CT of the abdomen pelvis.  Will give IV pain and nausea medicine, IV fluids.   MEDICATIONS GIVEN IN ED: Medications  ketorolac (TORADOL) 30 MG/ML injection 30 mg (30 mg Intravenous Given 12/15/22 2318)  morphine (PF) 4 MG/ML injection 4 mg (4 mg Intravenous Given 12/15/22 2317)  ondansetron (ZOFRAN) injection 4 mg (4 mg Intravenous Given 12/15/22 2315)  sodium chloride 0.9 % bolus 1,000 mL (0 mLs Intravenous Stopped 12/16/22 0103)  HYDROmorphone (DILAUDID) injection 1 mg (1 mg Intravenous Given 12/16/22 0015)     ED COURSE: Urine shows blood but no sign of infection.  No leukocytosis.  Normal creatinine.  CT scan reviewed and interpreted by myself and the  radiologist and shows 4 mm proximal right ureteral stone without obstructive changes.  Pain has been well-controlled here.  Will discharge home with pain and nausea medicine, Flomax and outpatient urologic follow-up.  At this time, I do not feel there is any life-threatening condition present. I reviewed all nursing notes, vitals, pertinent previous records.  All lab and urine results, EKGs, imaging ordered have been independently reviewed and interpreted by myself.  I reviewed all available radiology reports from any imaging ordered this visit.  Based on my assessment, I feel the patient is safe to be discharged home without further emergent workup and can continue workup as an outpatient as needed. Discussed all findings, treatment plan as well as usual and customary return precautions.  They verbalize understanding and are comfortable with this plan.  Outpatient follow-up has been provided as needed.  All questions have been answered.    CONSULTS:  none   OUTSIDE RECORDS REVIEWED: Reviewed previous neurology note in September 2024 for migraines.       FINAL CLINICAL IMPRESSION(S) / ED DIAGNOSES   Final diagnoses:  Kidney stone     Rx / DC Orders   ED Discharge Orders          Ordered    oxyCODONE-acetaminophen (PERCOCET) 5-325 MG tablet  Every 8 hours PRN        12/16/22 0104    ibuprofen (ADVIL) 800 MG tablet  Every 8 hours PRN        12/16/22 0104    ondansetron (ZOFRAN-ODT) 4 MG disintegrating tablet  Every 6 hours PRN        12/16/22 0104    tamsulosin (FLOMAX) 0.4 MG CAPS capsule  Daily        12/16/22 0104             Note:  This document was prepared using Dragon voice recognition software and may include unintentional dictation errors.   Antanisha Mohs, Layla Maw, DO 12/16/22 [phone]

## 2022-12-15 NOTE — ED Notes (Signed)
Poct pregnancy Negative 

## 2022-12-15 NOTE — ED Triage Notes (Signed)
Pt has right side flank pain for 1 hour.  Pt reports nausea.  Hx kidney stones.  Pt alert.

## 2022-12-16 MED ORDER — TAMSULOSIN HCL 0.4 MG PO CAPS: 0.4000 mg | ORAL_CAPSULE | Freq: Every day | ORAL | 0 refills | Status: DC

## 2022-12-16 MED ORDER — IBUPROFEN 800 MG PO TABS: 800.0000 mg | ORAL_TABLET | Freq: Three times a day (TID) | ORAL | 0 refills | Status: DC | PRN

## 2022-12-16 MED ORDER — ONDANSETRON 4 MG PO TBDP: 4.0000 mg | ORAL_TABLET | Freq: Four times a day (QID) | ORAL | 0 refills | Status: DC | PRN

## 2022-12-16 MED ORDER — OXYCODONE-ACETAMINOPHEN 5-325 MG PO TABS
2.0000 | ORAL_TABLET | Freq: Once | ORAL | Status: AC
  Administered 2022-12-16: 2 via ORAL
  Filled 2022-12-16: qty 2

## 2022-12-16 MED ORDER — HYDROMORPHONE HCL 1 MG/ML IJ SOLN
1.0000 mg | Freq: Once | INTRAMUSCULAR | Status: AC
  Administered 2022-12-16: 1 mg via INTRAVENOUS
  Filled 2022-12-16: qty 1

## 2022-12-16 MED ORDER — OXYCODONE-ACETAMINOPHEN 5-325 MG PO TABS: 2.0000 | ORAL_TABLET | Freq: Three times a day (TID) | ORAL | 0 refills | Status: DC | PRN

## 2022-12-16 NOTE — Discharge Instructions (Addendum)

## 2022-12-24 ENCOUNTER — Encounter: Payer: Self-pay | Admitting: Physician Assistant

## 2022-12-24 DIAGNOSIS — E66811 Obesity, class 1: Secondary | ICD-10-CM

## 2022-12-28 ENCOUNTER — Telehealth: Payer: Self-pay

## 2022-12-28 ENCOUNTER — Other Ambulatory Visit: Payer: Self-pay | Admitting: Physician Assistant

## 2022-12-28 DIAGNOSIS — G43009 Migraine without aura, not intractable, without status migrainosus: Secondary | ICD-10-CM | POA: Diagnosis not present

## 2022-12-28 DIAGNOSIS — E66811 Obesity, class 1: Secondary | ICD-10-CM

## 2022-12-28 DIAGNOSIS — H53149 Visual discomfort, unspecified: Secondary | ICD-10-CM | POA: Diagnosis not present

## 2022-12-28 DIAGNOSIS — G44209 Tension-type headache, unspecified, not intractable: Secondary | ICD-10-CM | POA: Diagnosis not present

## 2022-12-28 DIAGNOSIS — F40298 Other specified phobia: Secondary | ICD-10-CM | POA: Diagnosis not present

## 2022-12-28 MED ORDER — SEMAGLUTIDE-WEIGHT MANAGEMENT 0.25 MG/0.5ML ~~LOC~~ SOAJ
SUBCUTANEOUS | 1 refills | Status: DC
Start: 2022-12-28 — End: 2022-12-28

## 2022-12-28 MED ORDER — SEMAGLUTIDE-WEIGHT MANAGEMENT 0.5 MG/0.5ML ~~LOC~~ SOAJ
0.5000 mg | SUBCUTANEOUS | 0 refills | Status: AC
Start: 2023-01-26 — End: 2023-02-23

## 2022-12-28 MED ORDER — SEMAGLUTIDE-WEIGHT MANAGEMENT 1 MG/0.5ML ~~LOC~~ SOAJ
1.0000 mg | SUBCUTANEOUS | 0 refills | Status: DC
Start: 2023-02-24 — End: 2023-01-27

## 2022-12-28 NOTE — Telephone Encounter (Signed)
Copied from CRM [id number]. Topic: General - Other >> Dec 28, 2022 12:34 PM Franchot Heidelberg wrote: Reason for CRM: Caitlyn from [facility] called for prescription clarification, please advise

## 2022-12-30 ENCOUNTER — Inpatient Hospital Stay: Payer: BC Managed Care – PPO | Attending: Oncology

## 2022-12-30 ENCOUNTER — Ambulatory Visit: Payer: BC Managed Care – PPO

## 2023-01-13 ENCOUNTER — Encounter: Payer: Self-pay | Admitting: Obstetrics and Gynecology

## 2023-01-13 ENCOUNTER — Inpatient Hospital Stay: Payer: BC Managed Care – PPO | Attending: Oncology | Admitting: Obstetrics and Gynecology

## 2023-01-13 VITALS — BP 136/97 | HR 86 | Temp 98.6°F | Resp 20 | Wt 197.8 lb

## 2023-01-13 DIAGNOSIS — Z8541 Personal history of malignant neoplasm of cervix uteri: Secondary | ICD-10-CM | POA: Insufficient documentation

## 2023-01-13 DIAGNOSIS — Z923 Personal history of irradiation: Secondary | ICD-10-CM | POA: Insufficient documentation

## 2023-01-13 DIAGNOSIS — E28319 Asymptomatic premature menopause: Secondary | ICD-10-CM | POA: Insufficient documentation

## 2023-01-13 DIAGNOSIS — Z9221 Personal history of antineoplastic chemotherapy: Secondary | ICD-10-CM | POA: Insufficient documentation

## 2023-01-13 DIAGNOSIS — C539 Malignant neoplasm of cervix uteri, unspecified: Secondary | ICD-10-CM

## 2023-01-13 DIAGNOSIS — Z79890 Hormone replacement therapy (postmenopausal): Secondary | ICD-10-CM | POA: Diagnosis not present

## 2023-01-13 MED ORDER — ESTRADIOL 0.1 MG/GM VA CREA: 1.0000 | TOPICAL_CREAM | Freq: Every day | VAGINAL | 12 refills | Status: DC

## 2023-01-13 NOTE — Progress Notes (Signed)
Gynecologic Oncology Interval Visit   Referring Provider: Dr Dalbert Garnet  Chief Concern: cervical cancer visit for biopsy  Subjective:  Dawn Camacho is a 32 y.o. G0 female who is seen in consultation from Dr Dalbert Garnet for cervical cancer.  No bleeding or other complaints.    In ED 12/15/22 for right flank pain thought to be due to chronic kidney stones.  Stone has not passed, but feeling better, no pain.  CT scan renal protocol FINDINGS: Lower chest: No acute abnormality.   Hepatobiliary: No focal liver abnormality is seen. No gallstones, gallbladder wall thickening, or biliary dilatation.   Pancreas: Unremarkable. No pancreatic ductal dilatation or surrounding inflammatory changes.   Spleen: Normal in size without focal abnormality.   Adrenals/Urinary Tract: Adrenal glands are within normal limits. Left kidney shows punctate lower pole renal stone without obstructive change. Right kidney demonstrates moderate hydronephrosis secondary to a 4 mm stone in the proximal right ureter. Multiple nonobstructing right renal stones are noted as well measuring up to 4 mm. More distal right ureter is unremarkable. The bladder is decompressed.   Stomach/Bowel: Stomach is within normal limits. Appendix appears normal. No evidence of bowel wall thickening, distention, or inflammatory changes.   Vascular/Lymphatic: No significant vascular findings are present. No enlarged abdominal or pelvic lymph nodes.   Reproductive: Uterus and bilateral adnexa are unremarkable.   Other: No abdominal wall hernia or abnormality. No abdominopelvic ascites.   Musculoskeletal: No acute or significant osseous findings.   IMPRESSION: 4 mm proximal right ureteral stone with obstructive change.   Bilateral nonobstructing renal calculi as described.  Gynecologic Oncology History Dawn Camacho is a pleasant y.o. G0 female who is seen in consultation from Dr. Dr Dalbert Garnet for cervical cancer.  05/08/21  seen by Midwife for post coital spotting x 1.5 months. Cervical exam: Large, pink, friable and highly vascularized, non-tender, not smooth, irregular cervical growth extending from 11-5.   PAP ASCUS-H/HPV+. Patient's last menstrual period was 04/29/2021.    Colposcopy confirmed irregular cervical growth extending from 11-5:00. 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   06/12/2021 MRI Pelvis IMPRESSION: 1. 4.2 x 3.7 x 2.9 cm cervical mass involving the anterior aspect of the cervix and also likely involving the anterior aspect of the lower uterine segment. This extends into the vagina mainly on the left side. No MR findings to suggest involvement of the bladder, urethra rectum. 2. Borderline enlarged bilateral pelvic sidewall lymph nodes worrisome for metastatic disease.  06/19/2021 PET scan IMPRESSION: 1. Hypermetabolic activity of the cervix, compatible with primary malignancy. 2. Hypermetabolic subcentimeter bilateral external iliac lymph nodes, concerning for metastatic disease. 3. Mildly hypermetabolic left inguinal lymph node, concerning for additional site of metastatic disease. 4. Bilateral nonobstructing renal stones.  06/30/2021 Port placement   60Gy to cervix and nodes at [facility] under the care of Dr. Rushie Chestnut in 07/10/2021-08/29/2021. T&O at [facility] with Dr. Marinell Blight.  7/10 - 09/15/21 Cervix, T&O/R Total Dose 2400 cGy (each fraction 600 cGy)  Post treatment PET 04/09/22 was negative.   12/17/2021 Dilator teaching to avoid vaginal stenosis, performed at [facility].   Married and used Paediatric nurse for contraception.  Has not had a strong desire for children, but not sure for certain.   Had 2 Gardesil vaccinations, but then last in the series was not done due to recall or some other problem.  06/24/22- HIV negative      11/19/2021 PET scan IMPRESSION: 1. Complete metabolic response to therapy of cervical primary and pelvic  nodal metastasis. 2. Bilateral  nephrolithiasis.  02/24/2022 Brain MRI performed by Neurology for history of headaches IMPRESSION: Borderline Chiari 1 with slight crowding of the craniocervical junction. Otherwise, normal brain MRI.  04/09/2022 PET  IMPRESSION: No evidence of hypermetabolic recurrent or metastatic disease.  06/24/22 for surveillance. Pap was obtained but was insufficient for testing. Additionally, she had post coital spotting. Granulation tissue on cervix was observed and thought to be etiology. She was treated with silver nitrate. HPV was positive. It was recollected on 07/22/22 and reported as EPCA, ASCUS-H, HR HPV Positive.   08/05/22 colposcopy and biopsy with Dr Sonia Side.  Colposcopy of the upper vagina and cervix was performed after application os acetic acid. The findings revealed cervical erythema and most prominent at 5-6 o'clock. A slightly raised friable area on the left vagina around 5 o'clock by the cervix. WE lateral left wall. The transformation zone was/was not seen.  .  CERVIX, 6:00; BIOPSY:  - INFLAMED GRANULATION TISSUE AND SEPARATE FRAGMENTS OF  NECROINFLAMMATORY MATERIAL.  - AN EPITHELIAL SURFACE IS NOT PRESENT.  - STROMAL CHANGES CONSISTENT WITH PRIOR RADIATION THERAPY.  - NEGATIVE FOR DYSPLASIA AND MALIGNANCY.  Recommended RTC for ECC and possible additional biopsies.   09/23/2022  Endocervix, curettage - HIGH-GRADE SQUAMOUS INTRAEPITHELIAL LESION (HSIL).  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 The specimen was processed as a cell block due to the limited amount of material on gross examination. Sections demonstrate an extremely scant specimen with rare atypical cells that are p16 positive. Deeper sections were examined. 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 Features of superimposed radiation therapy effect are noted. Stain controls worked appropriately.  MRI  10/06/22 FINDINGS: Urinary Tract:  Bladder is within normal limits.   Bowel:  Visualized bowel is grossly unremarkable.   Vascular/Lymphatic: No evidence of aneurysm.   No suspicious pelvic lymphadenopathy.   Reproductive: No convincing residual cervical mass. Mild hypoperfusion along the right lateral aspect of the lower uterine segment/cervix on delayed imaging (series 26/image 40), but without associated restricted diffusion or other findings suggestive tumor.   Bilateral ovaries are within normal limits.   Other:  Small volume pelvic ascites.   Musculoskeletal: No focal osseous lesions.   IMPRESSION: No MR findings to suggest cervical cancer recurrence.   No evidence of metastatic disease in the pelvis.  PET scan 10/14/22 FINDINGS: Mediastinal blood pool activity: SUV max 2.8   Liver activity: SUV max NA   NECK: No hypermetabolic cervical lymphadenopathy.   Incidental CT findings: None.   CHEST: No hypermetabolic thoracic lymphadenopathy.   No suspicious pulmonary nodules.   Right chest port terminates at the cavoatrial junction.   Incidental CT findings: None.   ABDOMEN/PELVIS: No abnormal hypermetabolism in the liver, spleen, pancreas, or adrenal glands.   No hypermetabolic abdominopelvic lymphadenopathy. Small bilateral inguinal nodes, likely reactive.   No focal cervical hypermetabolism to suggest recurrence on PET.   Incidental CT findings: Five nonobstructing right renal calculi measuring up to 5 mm. No hydronephrosis. Trace pelvic fluid/ascites.   SKELETON: No focal hypermetabolic activity to suggest skeletal metastasis.   Incidental CT findings: None.   IMPRESSION: No findings suspicious for recurrent or metastatic disease.   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 07/22/2022   Cervical cancer, FIGO stage III (HCC) 06/24/2021   Cervical cancer (HCC) 06/11/2021   Dyshydrosis 11/21/2015    Past Medical History: Past Medical History:  Diagnosis Date   Allergic rhinitis    Allergy    Anxiety 2016   Cervical cancer (HCC)     Past Surgical History: Past Surgical History:  Procedure Laterality Date   IR IMAGING GUIDED PORT INSERTION  06/30/2021   TONSILECTOMY, ADENOIDECTOMY, BILATERAL MYRINGOTOMY AND TUBES Bilateral    TONSILLECTOMY     WISDOM TOOTH EXTRACTION       Family History: Family History  Problem Relation Age of Onset   Depression Mother    Hearing loss Mother    Kidney disease Mother    Miscarriages / [place] Mother    Obesity Mother    Heart disease Father    ADD / ADHD Father    Breast cancer Maternal Grandmother    Lung cancer Paternal Grandmother    Cancer Paternal Grandmother    COPD Paternal Grandmother    Heart attack Paternal Grandfather    ADD / ADHD Brother     Social History: Social History   Socioeconomic History   Marital status: Married    Spouse name: Not on file   Number of children: Not on file   Years of education: Not on file   Highest education level: Associate degree: academic program  Occupational History   Not on file  Tobacco Use   Smoking status: Never   Smokeless tobacco: Never  Vaping Use   Vaping status: Never Used  Substance and Sexual Activity   Alcohol use: Yes    Alcohol/week: 1.0 standard drink of alcohol    Types: 1 Glasses of wine per week    Comment: occasional--every couple of weeks.   Drug use: No   Sexual activity: Yes    Birth control/protection: None  Other Topics Concern   Not on file  Social History Narrative   Not on file   Social Determinants of Health    Financial Resource Strain: Low Risk  (10/19/2022)   Overall Financial Resource Strain (CARDIA)    Difficulty of Paying Living Expenses: Not hard at all  Food Insecurity: No Food Insecurity (10/19/2022)   Hunger Vital Sign    Worried About Running Out of Food in the Last Year: Never true    Ran Out of Food in the Last Year: Never true  Transportation Needs: No Transportation Needs (10/19/2022)   PRAPARE - Administrator, Civil Service (Medical): No    Lack of Transportation (Non-Medical): No  Physical Activity: Insufficiently Active (10/19/2022)   Exercise Vital Sign    Days of Exercise per Week: 2 days    Minutes of Exercise per Session: 30 min  Stress: No Stress Concern Present (10/19/2022)   [employer] of Occupational Health - Occupational Stress Questionnaire    Feeling of Stress : Only a little  Social Connections: Moderately Isolated (10/19/2022)   Social Connection and Isolation Panel [NHANES]    Frequency of Communication with Friends and Family: More than three times a week    Frequency of Social Gatherings with Friends and Family: Three times a week    Attends Religious Services: Never    Active Member of Clubs or Organizations: No    Attends Club  or Organization Meetings: Not on file    Marital Status: Married  Catering manager Violence: Not on file    Allergies: No Known Allergies  Current Medications: Current Outpatient Medications  Medication Sig Dispense Refill   aspirin-acetaminophen-caffeine (EXCEDRIN MIGRAINE) 250-250-65 MG tablet Take by mouth every 6 (six) hours as needed for headache.     ibuprofen (ADVIL) 800 MG tablet Take 1 tablet (800 mg total) by mouth every 8 (eight) hours as needed. 30 tablet 0   lidocaine (XYLOCAINE) 5 % ointment Apply 1 Application topically as needed (Bring to appointment for biopsy.). 35.44 g 0   lidocaine-prilocaine (EMLA) cream APPLY TOPICALLY TO THE AFFECTED AREA 1 TIME 30 g 3   Norethindrone-Ethinyl  Estradiol-Fe Biphas (LO LOESTRIN FE) 1 MG-10 MCG / 10 MCG tablet Take 1 tablet by mouth daily. Take pills continuously for 28 day cycle for hormonal replacement 28 tablet 11   ondansetron (ZOFRAN-ODT) 4 MG disintegrating tablet Take 1 tablet (4 mg total) by mouth every 6 (six) hours as needed for nausea or vomiting. 20 tablet 0   oxyCODONE-acetaminophen (PERCOCET) 5-325 MG tablet Take 2 tablets by mouth every 8 (eight) hours as needed for severe pain (pain score 7-10). 16 tablet 0   rizatriptan (MAXALT) 10 MG tablet Take 10 mg by mouth as needed for migraine. May repeat in 2 hours if needed     [START ON 01/26/2023] Semaglutide-Weight Management 0.5 MG/0.5ML SOAJ Inject 0.5 mg into the skin once a week for 28 days. 2 mL 0   [START ON 02/24/2023] Semaglutide-Weight Management 1 MG/0.5ML SOAJ Inject 1 mg into the skin once a week for 28 days. 2 mL 0   tamsulosin (FLOMAX) 0.4 MG CAPS capsule Take 1 capsule (0.4 mg total) by mouth daily. 30 capsule 0   venlafaxine (EFFEXOR) 37.5 MG tablet Start taking Effexor 37.5 mg daily for one week, then increase to 37.5 mg twice per day for headaches.     No current facility-administered medications for this visit.   Facility-Administered Medications Ordered in Other Visits  Medication Dose Route Frequency Provider Last Rate Last Admin   sodium chloride flush (NS) 0.9 % injection 10 mL  10 mL Intravenous PRN Creig Hines, MD   10 mL at 07/07/21 0841   Review of Systems General:  no complaints Skin: no complaints Eyes: no complaints HEENT: no complaints Breasts: no complaints Pulmonary: no complaints Cardiac: no complaints Gastrointestinal: no complaints Genitourinary/Sexual: no complaints Ob/Gyn: no complaints Musculoskeletal: no complaints Hematology: no complaints Neurologic/Psych: no complaints   Objective:  Physical Examination:  There were no vitals taken for this visit.   ECOG Performance Status: 0 - Asymptomatic  GENERAL: Patient is a well  appearing female in no acute distress HEENT: Atraumatic normocephalic.  LUNGS:  Nornal respiratory effort.  ABDOMEN:  Soft, nontender. Nondistended.  No masses or ascites EXTREMITIES:  No peripheral edema.   NEURO:  Nonfocal. Well oriented.  Appropriate affect.  Pelvic: Chaperoned with CMA EGBUS: no lesions Cervix: biopsy site almost completely healed. The os is not clearly seen.  The cervix is flush with the vagina.  Firm to palpation Vagina: no lesions, but upper vagina agglutinated. Broke up filmy adhesions and upper vagina and cervix otherwise not looking concerning.  Uterus: not grossly enlarged; seems to be normal size but exam limited, nontender, mobile BME: no palpable masses, parametria is smooth and there is no nodularity Rectovaginal: deferred.   Lab Review No labs on site today  Assessment:  Dawn Camacho is  a 32 y.o. G0 female diagnosed with IIIC1 invasive squamous cell cervical cancer 3/23 with 4.2 cm cervical tumor and positive external iliac nodes on PET scan s/p chemoradiation with negative posttreatment PET.    Vaginal bleeding 2/24 and PET/CT negative.  Abnormal Pap with ASC-H and HRHPV positive 5/24.  Colposcopic vaginal biopsy 6/24 showed just inflammation.  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 Pelvic MRI and PET scan 8/24 negative for recurrence. Today upper vagina agglutinated. Broke up filmy adhesions and upper vagina and cervix otherwise not looking concerning.  She has a dilator.    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   Port-a-cath in place  Peripheral neuropathy has resolved.   Medical co-morbidities complicating care: anxiety.  Plan:   Problem List Items Addressed This Visit       Genitourinary   Cervical cancer (HCC) - Primary    Discussed that with PET and MRI negative for cancer in the cervix a few months ago would recommend close surveillance with exams q2 months.  LEEP/[facility] would be technically very challenging and the cellular  changes on cytology and biopsy may be reactive due to radiation.  Hysterectomy would be an option if there is more concern for recurrence in the cervix, but risk of complications including fistula are significant post full radiation for cervical cancer. Will see her back in 2 months of sooner if concerning symptoms.    Continue LOESTRIN. Bone Health-  calcium 1200 mg and vitamin D 1000 iu daily for prevention of bone loss. Encouraged weight bearing exercise life long.  Will add vaginal estrogen cream and she will use with dilator now that vagina opened up again on exam today.  IV access: port a cath was placed for administration of chemotherapy.  Can consider removal at any time.   Cancer screenings and wellness - she will continue to see her pcp for cancer screenings and ongoing wellness.   The patient's diagnosis, an outline of the further diagnostic and laboratory studies which will be required, the recommendation, and alternatives were discussed.  All questions were answered to the patient's satisfaction.  Dawn Lauth, MD

## 2023-01-27 ENCOUNTER — Ambulatory Visit: Payer: BC Managed Care – PPO | Admitting: Physician Assistant

## 2023-01-27 ENCOUNTER — Telehealth (INDEPENDENT_AMBULATORY_CARE_PROVIDER_SITE_OTHER): Payer: BC Managed Care – PPO | Admitting: Physician Assistant

## 2023-01-27 DIAGNOSIS — E7800 Pure hypercholesterolemia, unspecified: Secondary | ICD-10-CM

## 2023-01-27 DIAGNOSIS — E669 Obesity, unspecified: Secondary | ICD-10-CM

## 2023-01-27 DIAGNOSIS — K5909 Other constipation: Secondary | ICD-10-CM | POA: Diagnosis not present

## 2023-01-27 DIAGNOSIS — E66811 Obesity, class 1: Secondary | ICD-10-CM

## 2023-01-27 NOTE — Progress Notes (Signed)
MyChart Video Visit  Virtual Visit via Video Note   This format is felt to be most appropriate for this patient at this time. Physical exam was limited by quality of the video and audio technology used for the visit.   Patient location: office Patient Location: Home  I discussed the limitations of evaluation and management by telemedicine and the availability of in person appointments. The patient expressed understanding and agreed to proceed.  Patient: Dawn Camacho   DOB: 1990-07-17   32 y.o. Female  MRN: [id number] Visit Date: 01/27/2023  Today's healthcare provider: Debera Lat, PA-C   No chief complaint on file.  Subjective     Discussed the use of AI scribe software for clinical note transcription with the patient, who gave verbal consent to proceed.  History of Present Illness   Dawn Camacho, a patient with a history of obesity, presents for a virtual follow-up visit. They report a positive response to the increased dosage of Wegovy, noting a weight loss of five pounds since the dosage adjustment. They have two more doses remaining before a refill is needed. They have also made dietary changes, eating healthier and less, which they believe is contributing to their weight loss. Exercise has been more challenging due to a busy work schedule, but they are committed to improving in this area.  They report minimal side effects from the [facility] with only mild nausea occurring within 24 hours of taking the medication. They also report constipation, but it is unclear if this is a side effect of the medication or related to another factor. They have not experienced any other gastrointestinal issues such as diarrhea or vomiting.         Medications: Outpatient Medications Prior to Visit  Medication Sig   aspirin-acetaminophen-caffeine (EXCEDRIN MIGRAINE) 250-250-65 MG tablet Take by mouth every 6 (six) hours as needed for headache.   estradiol (ESTRACE VAGINAL) 0.1 MG/GM  vaginal cream Place 1 Applicatorful vaginally at bedtime. For 2 weeks then 3 nights a week.   ibuprofen (ADVIL) 800 MG tablet Take 1 tablet (800 mg total) by mouth every 8 (eight) hours as needed.   lidocaine (XYLOCAINE) 5 % ointment Apply 1 Application topically as needed (Bring to appointment for biopsy.).   lidocaine-prilocaine (EMLA) cream APPLY TOPICALLY TO THE AFFECTED AREA 1 TIME   Norethindrone-Ethinyl Estradiol-Fe Biphas (LO LOESTRIN FE) 1 MG-10 MCG / 10 MCG tablet Take 1 tablet by mouth daily. Take pills continuously for 28 day cycle for hormonal replacement   ondansetron (ZOFRAN-ODT) 4 MG disintegrating tablet Take 1 tablet (4 mg total) by mouth every 6 (six) hours as needed for nausea or vomiting.   oxyCODONE-acetaminophen (PERCOCET) 5-325 MG tablet Take 2 tablets by mouth every 8 (eight) hours as needed for severe pain (pain score 7-10).   rizatriptan (MAXALT) 10 MG tablet Take 10 mg by mouth as needed for migraine. May repeat in 2 hours if needed   Semaglutide-Weight Management 0.5 MG/0.5ML SOAJ Inject 0.5 mg into the skin once a week for 28 days.   [START ON 02/24/2023] Semaglutide-Weight Management 1 MG/0.5ML SOAJ Inject 1 mg into the skin once a week for 28 days.   tamsulosin (FLOMAX) 0.4 MG CAPS capsule Take 1 capsule (0.4 mg total) by mouth daily.   venlafaxine (EFFEXOR) 37.5 MG tablet Start taking Effexor 37.5 mg daily for one week, then increase to 37.5 mg twice per day for headaches.   Facility-Administered Medications Prior to Visit  Medication Dose Route Frequency Provider  sodium chloride flush (NS) 0.9 % injection 10 mL  10 mL Intravenous PRN Creig Hines, MD    Review of Systems  All other systems reviewed and are negative.  Except see HPI   {Insert previous labs (optional):23779} {See past labs  Heme  Chem  Endocrine  Serology  Results Review (optional):1}   Objective    There were no vitals taken for this visit.  {Insert last BP/Wt  (optional):23777}{See vitals history (optional):1}    Physical Exam Constitutional:      General: She is not in acute distress.    Appearance: Normal appearance.  HENT:     Head: Normocephalic.  Pulmonary:     Effort: Pulmonary effort is normal. No respiratory distress.  Neurological:     Mental Status: She is alert and oriented to person, place, and time. Mental status is at baseline.        Assessment & Plan    1. Obesity (BMI 30.0-34.9     Obesity Significant weight loss (20-25 lbs) since starting Wegovy. Minimal side effects, mainly mild nausea within 24 hours of administration. Constipation reported, possibly related to medication or dietary changes. -Continue Wegovy, reassess dosage at next appointment. -Encouraged to increase water intake and dietary fiber for constipation management. -Consider daily probiotic yogurt or natural laxatives such as dry prunes or apricots. -Encouraged to maintain and possibly increase current exercise regimen (3-4 times a week, 30 minutes cardio and 30 minutes strength training).  Follow-up in 6 weeks to assess progress and potential dosage adjustment. Schedule lab work prior to next appointment.     2. Other constipation ***  3. Elevated cholesterol ***  Return in about 6 weeks (around 03/10/2023) for chronic disease f/u/obesity.     I discussed the assessment and treatment plan with the patient. The patient was provided an opportunity to ask questions and all were answered. The patient agreed with the plan and demonstrated an understanding of the instructions.   The patient was advised to call back or seek an in-person evaluation if the symptoms worsen or if the condition fails to improve as anticipated.  I provided 9 minutes of non-face-to-face time during this encounter.  I, Debera Lat, PA-C have reviewed all documentation for this visit. The documentation on  01/27/23  for the exam, diagnosis, procedures, and orders are all  accurate and complete.  Debera Lat, [facility], MMS [facility] [phone] (phone) [phone] (fax)  [facility]

## 2023-01-28 ENCOUNTER — Encounter: Payer: Self-pay | Admitting: Physician Assistant

## 2023-01-28 MED ORDER — SEMAGLUTIDE-WEIGHT MANAGEMENT 1 MG/0.5ML ~~LOC~~ SOAJ
1.0000 mg | SUBCUTANEOUS | 0 refills | Status: DC
Start: 2023-02-24 — End: 2023-02-05

## 2023-02-01 ENCOUNTER — Encounter: Payer: Self-pay | Admitting: Physician Assistant

## 2023-02-01 DIAGNOSIS — E66811 Obesity, class 1: Secondary | ICD-10-CM

## 2023-02-02 ENCOUNTER — Inpatient Hospital Stay: Payer: BC Managed Care – PPO | Attending: Oncology

## 2023-02-02 DIAGNOSIS — Z95828 Presence of other vascular implants and grafts: Secondary | ICD-10-CM

## 2023-02-02 DIAGNOSIS — Z8541 Personal history of malignant neoplasm of cervix uteri: Secondary | ICD-10-CM | POA: Insufficient documentation

## 2023-02-02 MED ORDER — HEPARIN SOD (PORK) LOCK FLUSH 100 UNIT/ML IV SOLN
500.0000 [IU] | Freq: Once | INTRAVENOUS | Status: AC
  Administered 2023-02-02: 500 [IU] via INTRAVENOUS
  Filled 2023-02-02: qty 5

## 2023-02-02 MED ORDER — SODIUM CHLORIDE 0.9% FLUSH
10.0000 mL | Freq: Once | INTRAVENOUS | Status: AC
  Administered 2023-02-02: 10 mL via INTRAVENOUS
  Filled 2023-02-02: qty 10

## 2023-02-05 MED ORDER — SEMAGLUTIDE-WEIGHT MANAGEMENT 1 MG/0.5ML ~~LOC~~ SOAJ
1.0000 mg | SUBCUTANEOUS | 0 refills | Status: DC
Start: 2023-02-24 — End: 2023-03-09

## 2023-03-01 ENCOUNTER — Telehealth: Payer: Self-pay | Admitting: Physician Assistant

## 2023-03-01 NOTE — Telephone Encounter (Signed)
 Prescription sent on 02/24/23. Too soon to refill.

## 2023-03-01 NOTE — Telephone Encounter (Signed)
[facility] pharmacy is requesting prescription refill Semaglutide-Weight Management 1 MG/0.5ML SOAJ  Please advise

## 2023-03-08 ENCOUNTER — Other Ambulatory Visit: Payer: Self-pay | Admitting: Physician Assistant

## 2023-03-08 ENCOUNTER — Encounter: Payer: Self-pay | Admitting: Physician Assistant

## 2023-03-08 DIAGNOSIS — E66811 Obesity, class 1: Secondary | ICD-10-CM

## 2023-03-09 ENCOUNTER — Telehealth: Payer: Self-pay | Admitting: Physician Assistant

## 2023-03-09 MED ORDER — SEMAGLUTIDE-WEIGHT MANAGEMENT 1 MG/0.5ML ~~LOC~~ SOAJ
1.0000 mg | SUBCUTANEOUS | 0 refills | Status: DC
Start: 2023-03-09 — End: 2023-04-01

## 2023-03-09 NOTE — Telephone Encounter (Signed)
 Rx was sent

## 2023-03-09 NOTE — Telephone Encounter (Signed)
 [facility] pharmacy is requesting prescription refill Semaglutide-Weight Management 1 MG/0.5ML SOAJ   Please advise

## 2023-03-09 NOTE — Telephone Encounter (Signed)
 This med request already  been sent to the pharmacy

## 2023-03-15 DIAGNOSIS — Z8541 Personal history of malignant neoplasm of cervix uteri: Secondary | ICD-10-CM | POA: Diagnosis not present

## 2023-03-15 DIAGNOSIS — G43009 Migraine without aura, not intractable, without status migrainosus: Secondary | ICD-10-CM | POA: Diagnosis not present

## 2023-03-15 DIAGNOSIS — H53149 Visual discomfort, unspecified: Secondary | ICD-10-CM | POA: Diagnosis not present

## 2023-03-15 DIAGNOSIS — F40298 Other specified phobia: Secondary | ICD-10-CM | POA: Diagnosis not present

## 2023-03-19 ENCOUNTER — Telehealth: Payer: Self-pay | Admitting: Physician Assistant

## 2023-03-19 NOTE — Telephone Encounter (Signed)
Received fax from [facility] stating Dawn Camacho needs prior auth.     It says to fax request to [phone] Patient ID:   [id number]

## 2023-03-23 NOTE — Telephone Encounter (Signed)
Pt called saying insurance told her that the dosage needs to go up in the medication before they will approve.

## 2023-03-24 ENCOUNTER — Inpatient Hospital Stay: Payer: BC Managed Care – PPO | Attending: Oncology | Admitting: Obstetrics and Gynecology

## 2023-03-24 ENCOUNTER — Other Ambulatory Visit: Payer: Self-pay | Admitting: Nurse Practitioner

## 2023-03-24 ENCOUNTER — Encounter: Payer: Self-pay | Admitting: Obstetrics and Gynecology

## 2023-03-24 VITALS — BP 124/75 | HR 70 | Temp 98.6°F | Resp 20 | Wt 183.4 lb

## 2023-03-24 DIAGNOSIS — Z87442 Personal history of urinary calculi: Secondary | ICD-10-CM | POA: Diagnosis not present

## 2023-03-24 DIAGNOSIS — R87820 Cervical low risk human papillomavirus (HPV) DNA test positive: Secondary | ICD-10-CM | POA: Diagnosis not present

## 2023-03-24 DIAGNOSIS — Z8249 Family history of ischemic heart disease and other diseases of the circulatory system: Secondary | ICD-10-CM | POA: Insufficient documentation

## 2023-03-24 DIAGNOSIS — N202 Calculus of kidney with calculus of ureter: Secondary | ICD-10-CM | POA: Diagnosis not present

## 2023-03-24 DIAGNOSIS — Z8349 Family history of other endocrine, nutritional and metabolic diseases: Secondary | ICD-10-CM | POA: Insufficient documentation

## 2023-03-24 DIAGNOSIS — C775 Secondary and unspecified malignant neoplasm of intrapelvic lymph nodes: Secondary | ICD-10-CM | POA: Diagnosis not present

## 2023-03-24 DIAGNOSIS — Z801 Family history of malignant neoplasm of trachea, bronchus and lung: Secondary | ICD-10-CM | POA: Diagnosis not present

## 2023-03-24 DIAGNOSIS — R87613 High grade squamous intraepithelial lesion on cytologic smear of cervix (HGSIL): Secondary | ICD-10-CM | POA: Insufficient documentation

## 2023-03-24 DIAGNOSIS — Z9089 Acquired absence of other organs: Secondary | ICD-10-CM | POA: Diagnosis not present

## 2023-03-24 DIAGNOSIS — Z9221 Personal history of antineoplastic chemotherapy: Secondary | ICD-10-CM | POA: Insufficient documentation

## 2023-03-24 DIAGNOSIS — R109 Unspecified abdominal pain: Secondary | ICD-10-CM | POA: Insufficient documentation

## 2023-03-24 DIAGNOSIS — G935 Compression of brain: Secondary | ICD-10-CM | POA: Insufficient documentation

## 2023-03-24 DIAGNOSIS — Z923 Personal history of irradiation: Secondary | ICD-10-CM | POA: Diagnosis not present

## 2023-03-24 DIAGNOSIS — Z95828 Presence of other vascular implants and grafts: Secondary | ICD-10-CM

## 2023-03-24 DIAGNOSIS — C539 Malignant neoplasm of cervix uteri, unspecified: Secondary | ICD-10-CM | POA: Insufficient documentation

## 2023-03-24 DIAGNOSIS — Z79899 Other long term (current) drug therapy: Secondary | ICD-10-CM | POA: Diagnosis not present

## 2023-03-24 DIAGNOSIS — Z822 Family history of deafness and hearing loss: Secondary | ICD-10-CM | POA: Diagnosis not present

## 2023-03-24 DIAGNOSIS — Z841 Family history of disorders of kidney and ureter: Secondary | ICD-10-CM | POA: Insufficient documentation

## 2023-03-24 DIAGNOSIS — Z803 Family history of malignant neoplasm of breast: Secondary | ICD-10-CM | POA: Insufficient documentation

## 2023-03-24 NOTE — Telephone Encounter (Signed)
Pt calling in to follow up on prior authorization request. Pt would like a call back, [phone]

## 2023-03-24 NOTE — Progress Notes (Signed)
Gynecologic Oncology Interval Visit   Referring Provider: Dr Dalbert Garnet  Chief Concern: cervical cancer, surveillance visit Subjective:  Dawn Camacho is a 33 y.o. G0 female who is seen in consultation from Dr Dalbert Garnet for cervical cancer.  No bleeding or other complaints since last visit 2 months ago.  Working and going to school. She is sexually active and using vaginal dilator.    Gynecologic Oncology History Dawn Camacho was seen in consultation from Dr. Dr Dalbert Garnet for cervical cancer.  05/08/21 seen by Midwife for post coital spotting x 1.5 months. Cervical exam: Large, pink, friable and highly vascularized, non-tender, not smooth, irregular cervical growth extending from 11-5.   PAP ASCUS-HR/HPV+. Patient's last menstrual period was 04/29/2021.    Colposcopy confirmed irregular cervical growth extending from 11-5:00. 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   06/12/2021 MRI Pelvis IMPRESSION: 1. 4.2 x 3.7 x 2.9 cm cervical mass involving the anterior aspect of the cervix and also likely involving the anterior aspect of the lower uterine segment. This extends into the vagina mainly on the left side. No MR findings to suggest involvement of the bladder, urethra or rectum. 2. Borderline enlarged bilateral pelvic sidewall lymph nodes worrisome for metastatic disease.  06/19/2021 PET scan IMPRESSION: 1. Hypermetabolic activity of the cervix, compatible with primary malignancy. 2. Hypermetabolic subcentimeter bilateral external iliac lymph nodes, concerning for metastatic disease. 3. Mildly hypermetabolic left inguinal lymph node, concerning for additional site of metastatic disease. 4. Bilateral nonobstructing renal stones.  06/30/2021 Port placement  60Gy to cervix and nodes at [facility] under the care of Dr. Rushie Chestnut in 07/10/2021-08/29/2021. T&O at [facility] with Dr. Marinell Blight.  7/10 - 09/15/21 Cervix, T&O/R Total Dose 2400 cGy (each fraction 600  cGy)  Post treatment PET 04/09/22 was negative.   12/17/2021 Dilator teaching to avoid vaginal stenosis, performed at [facility].   Married and used Paediatric nurse for contraception.  Has not had a strong desire for children, but not sure for certain.   Had 2 Gardesil vaccinations, but then last in the series was not done due to recall or some other problem.  06/24/22- HIV negative      11/19/2021 PET scan IMPRESSION: 1. Complete metabolic response to therapy of cervical primary and pelvic nodal metastasis. 2. Bilateral nephrolithiasis.  02/24/2022 Brain MRI performed by Neurology for history of headaches IMPRESSION: Borderline Chiari 1 with slight crowding of the craniocervical junction. Otherwise, normal brain MRI.  04/09/2022 PET  IMPRESSION: No evidence of hypermetabolic recurrent or metastatic disease.  06/24/22 for surveillance. Pap was obtained but was insufficient for testing. Additionally, she had post coital spotting. Granulation tissue on cervix was observed and thought to be etiology. She was treated with silver nitrate. HPV was positive. It was recollected on 07/22/22 and reported as EPCA, ASCUS-H, HR HPV Positive.   08/05/22 colposcopy and biopsy with Dr Sonia Side.  Colposcopy of the upper vagina and cervix was performed after application os acetic acid. The findings revealed cervical erythema and most prominent at 5-6 o'clock. A slightly raised friable area on the left vagina around 5 o'clock by the cervix. WE lateral left wall. The transformation zone was/was not seen.  .  CERVIX, 6:00; BIOPSY:  - INFLAMED GRANULATION TISSUE AND SEPARATE FRAGMENTS OF NECROINFLAMMATORY MATERIAL.  - AN EPITHELIAL SURFACE IS NOT PRESENT.  - STROMAL CHANGES CONSISTENT WITH PRIOR RADIATION THERAPY.  - NEGATIVE FOR DYSPLASIA AND MALIGNANCY.  Recommended RTC for ECC and possible additional biopsies.   09/23/2022  Endocervix, curettage -  HIGH-GRADE SQUAMOUS INTRAEPITHELIAL LESION (HSIL).  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 The specimen was processed as a cell block due to the limited amount of material on gross examination. Sections demonstrate an extremely scant specimen with rare atypical cells that are p16 positive. Deeper sections were examined. 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 Features of superimposed radiation therapy effect are noted.  MRI 10/06/22 FINDINGS: Urinary Tract:  Bladder is within normal limits.   Bowel:  Visualized bowel is grossly unremarkable.   Vascular/Lymphatic: No evidence of aneurysm.   No suspicious pelvic lymphadenopathy.   Reproductive: No convincing residual cervical mass. Mild hypoperfusion along the right lateral aspect of the lower uterine segment/cervix on delayed imaging (series 26/image 40), but without associated restricted diffusion or other findings suggestive tumor.   Bilateral ovaries are within normal limits.   Other:  Small volume pelvic ascites.   Musculoskeletal: No focal osseous lesions.   IMPRESSION: No MR findings to suggest cervical cancer recurrence. No evidence of metastatic disease in the pelvis.  PET scan 10/14/22 FINDINGS: Mediastinal blood pool activity: SUV max 2.8   Liver activity: SUV max NA   NECK: No hypermetabolic cervical lymphadenopathy.   Incidental CT findings: None.   CHEST: No hypermetabolic thoracic lymphadenopathy.   No suspicious pulmonary nodules.   Right chest port terminates at the cavoatrial junction.   Incidental CT findings: None.   ABDOMEN/PELVIS: No abnormal hypermetabolism in the liver, spleen, pancreas, or adrenal glands.   No hypermetabolic abdominopelvic lymphadenopathy. Small bilateral inguinal nodes, likely reactive.   No focal cervical hypermetabolism to suggest recurrence on PET.   Incidental CT findings: Five nonobstructing right renal  calculi measuring up to 5 mm. No hydronephrosis. Trace pelvic fluid/ascites.   SKELETON: No focal hypermetabolic activity to suggest skeletal metastasis.   Incidental CT findings: None.   IMPRESSION: No findings suspicious for recurrent or metastatic disease.    In ED 12/15/22 for right flank pain thought to be due to chronic kidney stones.  Stone has not passed, but feeling better, no pain.  CT scan renal protocol FINDINGS: Lower chest: No acute abnormality.   Hepatobiliary: No focal liver abnormality is seen. No gallstones, gallbladder wall thickening, or biliary dilatation.   Pancreas: Unremarkable. No pancreatic ductal dilatation or surrounding inflammatory changes.   Spleen: Normal in size without focal abnormality.   Adrenals/Urinary Tract: Adrenal glands are within normal limits. Left kidney shows punctate lower pole renal stone without obstructive change. Right kidney demonstrates moderate hydronephrosis secondary to a 4 mm stone in the proximal right ureter. Multiple nonobstructing right renal stones are noted as well measuring up to 4 mm. More distal right ureter is unremarkable. The bladder is decompressed.   Stomach/Bowel: Stomach is within normal limits. Appendix appears normal. No evidence of bowel wall thickening, distention, or inflammatory changes.   Vascular/Lymphatic: No significant vascular findings are present. No enlarged abdominal or pelvic lymph nodes.   Reproductive: Uterus and bilateral adnexa are unremarkable.   Other: No abdominal wall hernia or abnormality. No abdominopelvic ascites.   Musculoskeletal: No acute or significant osseous findings.   IMPRESSION: 4 mm proximal right ureteral stone with obstructive change.   Bilateral nonobstructing renal calculi as described.   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 07/22/2022   Cervical cancer, FIGO stage III (HCC) 06/24/2021   Cervical cancer (HCC) 06/11/2021   Dyshydrosis 11/21/2015    Past Medical History: Past Medical History:  Diagnosis Date   Allergic rhinitis    Allergy    Anxiety 2016   Cervical cancer (HCC)     Past Surgical History: Past Surgical History:  Procedure Laterality Date   IR IMAGING GUIDED PORT INSERTION  06/30/2021   TONSILECTOMY, ADENOIDECTOMY, BILATERAL MYRINGOTOMY AND TUBES Bilateral    TONSILLECTOMY     WISDOM TOOTH EXTRACTION       Family History: Family History  Problem Relation Age of Onset   Depression Mother    Hearing loss Mother    Kidney disease Mother    Miscarriages / [place] Mother    Obesity Mother    Heart disease Father    ADD / ADHD Father    Breast cancer Maternal Grandmother    Lung cancer Paternal Grandmother    Cancer Paternal Grandmother    COPD Paternal Grandmother    Heart attack Paternal Grandfather    ADD / ADHD Brother     Social History: Social History   Socioeconomic History   Marital status: Married    Spouse name: Not on file   Number of children: Not on file   Years of education: Not on file   Highest education level: Associate degree: academic program  Occupational History   Not on file  Tobacco Use   Smoking status: Never   Smokeless tobacco: Never  Vaping Use   Vaping status: Never Used  Substance and Sexual Activity   Alcohol use: Yes    Alcohol/week: 1.0 standard drink of alcohol    Types: 1 Glasses of wine per week    Comment: occasional--every couple of weeks.   Drug use: No   Sexual activity: Yes    Birth control/protection: None  Other Topics Concern   Not on file  Social History  Narrative   Not on file   Social Drivers of Health   Financial Resource Strain: Low Risk  (10/19/2022)   Overall Financial Resource Strain (CARDIA)    Difficulty of Paying Living Expenses: Not hard at all  Food Insecurity: No Food Insecurity (10/19/2022)   Hunger Vital Sign    Worried About Running Out of Food in the Last Year: Never true    Ran Out of Food in the Last Year: Never true  Transportation Needs: No Transportation Needs (10/19/2022)   PRAPARE - Administrator, Civil Service (Medical): No    Lack of Transportation (Non-Medical): No  Physical Activity: Insufficiently Active (10/19/2022)   Exercise Vital Sign    Days of Exercise per Week: 2 days    Minutes of Exercise per Session: 30 min  Stress: No Stress Concern Present (10/19/2022)   [employer] of Occupational Health - Occupational Stress Questionnaire    Feeling of Stress : Only a little  Social Connections: Moderately Isolated (10/19/2022)   Social Connection and Isolation Panel [NHANES]    Frequency of Communication with Friends and Family: More than three times a week    Frequency of Social Gatherings with Friends and Family: Three times a week    Attends Religious Services: Never    Active Member of Clubs or Organizations:  No    Attends Banker Meetings: Not on file    Marital Status: Married  Catering manager Violence: Not on file    Allergies: No Known Allergies  Current Medications: Current Outpatient Medications  Medication Sig Dispense Refill   aspirin-acetaminophen-caffeine (EXCEDRIN MIGRAINE) 250-250-65 MG tablet Take by mouth every 6 (six) hours as needed for headache.     estradiol (ESTRACE VAGINAL) 0.1 MG/GM vaginal cream Place 1 Applicatorful vaginally at bedtime. For 2 weeks then 3 nights a week. 42.5 g 12   ibuprofen (ADVIL) 800 MG tablet Take 1 tablet (800 mg total) by mouth every 8 (eight) hours as needed. 30 tablet 0   lidocaine (XYLOCAINE) 5 % ointment Apply 1  Application topically as needed (Bring to appointment for biopsy.). 35.44 g 0   lidocaine-prilocaine (EMLA) cream APPLY TOPICALLY TO THE AFFECTED AREA 1 TIME 30 g 3   Norethindrone-Ethinyl Estradiol-Fe Biphas (LO LOESTRIN FE) 1 MG-10 MCG / 10 MCG tablet Take 1 tablet by mouth daily. Take pills continuously for 28 day cycle for hormonal replacement 28 tablet 11   oxyCODONE-acetaminophen (PERCOCET) 5-325 MG tablet Take 2 tablets by mouth every 8 (eight) hours as needed for severe pain (pain score 7-10). 16 tablet 0   rizatriptan (MAXALT) 10 MG tablet Take 10 mg by mouth as needed for migraine. May repeat in 2 hours if needed     Semaglutide-Weight Management 1 MG/0.5ML SOAJ Inject 1 mg into the skin once a week for 28 days. 2 mL 0   venlafaxine (EFFEXOR) 37.5 MG tablet Start taking Effexor 37.5 mg daily for one week, then increase to 37.5 mg twice per day for headaches.     ondansetron (ZOFRAN-ODT) 4 MG disintegrating tablet Take 1 tablet (4 mg total) by mouth every 6 (six) hours as needed for nausea or vomiting. (Patient not taking: Reported on 03/24/2023) 20 tablet 0   tamsulosin (FLOMAX) 0.4 MG CAPS capsule Take 1 capsule (0.4 mg total) by mouth daily. (Patient not taking: Reported on 03/24/2023) 30 capsule 0   No current facility-administered medications for this visit.   Facility-Administered Medications Ordered in Other Visits  Medication Dose Route Frequency Provider Last Rate Last Admin   sodium chloride flush (NS) 0.9 % injection 10 mL  10 mL Intravenous PRN Creig Hines, MD   10 mL at 07/07/21 0841   Review of Systems General:  no complaints Skin: no complaints Eyes: no complaints HEENT: no complaints Breasts: no complaints Pulmonary: no complaints Cardiac: no complaints Gastrointestinal: no complaints Genitourinary/Sexual: no complaints Ob/Gyn: no complaints Musculoskeletal: no complaints Hematology: no complaints Neurologic/Psych: no complaints   Objective:  Physical  Examination:  BP 124/75   Pulse 70   Temp 98.6 F (37 C)   Resp 20   Wt 183 lb 6.4 oz (83.2 kg)   SpO2 100%   BMI 29.60 kg/m    ECOG Performance Status: 0 - Asymptomatic  GENERAL: Patient is a well appearing female in no acute distress HEENT: Atraumatic normocephalic.  LUNGS:  Nornal respiratory effort.  ABDOMEN:  Soft, nontender. Nondistended.  No masses or ascites EXTREMITIES:  No peripheral edema.   NEURO:  Nonfocal. Well oriented.  Appropriate affect.  Pelvic: Chaperoned with CMA EGBUS: no lesions Cervix:  The cervix is flush with the vagina, but canal open. Smooth to palpation Vagina: no lesions, but upper vagina again agglutinated. Broke up filmy adhesions and upper vagina and cervix otherwise looks atrophic.  Able to advance PAP cytobrush into canal.  Uterus: not grossly enlarged; seems to be normal size but exam limited, nontender, mobile BME: no palpable masses, parametria is smooth and there is no nodularity Rectovaginal: deferred.   Lab Review No labs on site today  Assessment:  Dawn Camacho is a 33 y.o. G0 female diagnosed with IIIC1 invasive squamous cell cervical cancer 3/23 with 4.2 cm cervical tumor and positive external iliac nodes on PET scan s/p chemoradiation with negative posttreatment PET.    Vaginal bleeding 2/24 and PET/CT negative.  Abnormal Pap with ASC-H and HR HPV positive 5/24.  Colposcopic vaginal biopsy 6/24 showed just inflammation.  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 Pelvic MRI and PET scan 8/24 negative for recurrence. Plan for close surveillance with PAPs and imaging.    Kidney stone in 10/24 and still has not passed, but no symptoms.   No bleeding, pain or discharge recently.  Today upper vagina agglutinated. Broke up filmy adhesions in upper vagina again and cervix does not looking concerning.  She has a dilator and is sexually active.       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   Port-a-cath in  place  Peripheral neuropathy has resolved.   Medical co-morbidities complicating care: anxiety.  Plan:   Problem List Items Addressed This Visit       Genitourinary   Cervical cancer [facility]) - Primary    Discussed that with PET and MRI negative for cancer in the cervix 8/24 recommend close surveillance with exams q2 months.  LEEP/[facility] would be technically challenging and the cellular changes on cytology and biopsy may be reactive due to radiation.  Hysterectomy would be an option if there is more concern for recurrence in the cervix, but risk of complications including fistula are significant post full radiation for cervical cancer. PAP repeated today and I was able to get the brush up into the canal.    Will see her back in 2 months or sooner if PAP concerning or new symptoms and can repeat imaging.    Continue LOESTRIN. Bone Health-  calcium 1200 mg and vitamin D 1000 iu daily for prevention of bone loss. Encouraged weight bearing exercise life long.   Will add vaginal estrogen cream and she will use with dilator now that vagina opened up again on exam today.  IV access: port a cath was placed for administration of chemotherapy.  Can consider removal at any time, and we will follow up with her about this.   Cancer screenings and wellness - she will continue to see her pcp for cancer screenings and ongoing wellness.   The patient's diagnosis, an outline of the further diagnostic and laboratory studies which will be required, the recommendation, and alternatives were discussed.  All questions were answered to the patient's satisfaction.  Leida Lauth, MD

## 2023-03-24 NOTE — Progress Notes (Signed)
Spoke to patient. She's in agreement for port removal. Order placed.

## 2023-03-25 ENCOUNTER — Telehealth: Payer: Self-pay

## 2023-03-25 NOTE — Telephone Encounter (Signed)
-----   Message from Alinda Dooms sent at 03/24/2023  4:26 PM EST ----- Regarding: FW: I placed order. Would you please follow up on this and make sure she gets it scheduled please? Thank you! ----- Message ----- From: Creig Hines, MD Sent: 03/24/2023   4:04 PM EST To: Alinda Dooms, NP Subject: RE:                                            Yes ----- Message ----- From: Alinda Dooms, NP Sent: 03/24/2023   4:04 PM EST To: Creig Hines, MD  Would you be ok with port removal for this patient?

## 2023-03-29 ENCOUNTER — Telehealth: Payer: Self-pay | Admitting: Physician Assistant

## 2023-03-29 DIAGNOSIS — E66811 Obesity, class 1: Secondary | ICD-10-CM

## 2023-03-29 LAB — IGP, APTIMA HPV: HPV Aptima: POSITIVE — AB

## 2023-03-29 NOTE — Telephone Encounter (Signed)
Patient states that she was reached out to by [facility] regarding her [facility] medication. Pt states that they told her she would have to go through their program in order for them to prescribe [facility] for her and it might not be covered under her insurance. Per pt her insurance is willing to let her keep getting the [facility] but she has to keep going up on the next dosages in order for them to cover the medication. It would need to keep going up to the max dosage or until her weight is stabilized. Pt is confused as to why she was referred to [facility] since her PCP is the one who prescribed the medication for her. Pt is wanting to speak with her PCP.

## 2023-03-31 ENCOUNTER — Encounter (INDEPENDENT_AMBULATORY_CARE_PROVIDER_SITE_OTHER): Payer: Self-pay | Admitting: Physician Assistant

## 2023-03-31 NOTE — Telephone Encounter (Signed)
 Port removal scheduled for 04/06/23 at 2 pm patient to arrive at 1:30 pm. Patient is in agreement with date and time.

## 2023-04-01 MED ORDER — SEMAGLUTIDE-WEIGHT MANAGEMENT 1.7 MG/0.75ML ~~LOC~~ SOAJ
1.7000 mg | SUBCUTANEOUS | 0 refills | Status: DC
Start: 2023-04-01 — End: 2023-05-20

## 2023-04-02 ENCOUNTER — Inpatient Hospital Stay (HOSPITAL_BASED_OUTPATIENT_CLINIC_OR_DEPARTMENT_OTHER): Payer: BC Managed Care – PPO | Admitting: Oncology

## 2023-04-02 ENCOUNTER — Inpatient Hospital Stay: Payer: BC Managed Care – PPO | Attending: Oncology

## 2023-04-02 ENCOUNTER — Encounter: Payer: Self-pay | Admitting: Oncology

## 2023-04-02 VITALS — BP 128/89 | HR 68 | Temp 97.3°F | Resp 17 | Wt 185.0 lb

## 2023-04-02 DIAGNOSIS — Z08 Encounter for follow-up examination after completed treatment for malignant neoplasm: Secondary | ICD-10-CM | POA: Diagnosis not present

## 2023-04-02 DIAGNOSIS — Z8541 Personal history of malignant neoplasm of cervix uteri: Secondary | ICD-10-CM | POA: Diagnosis not present

## 2023-04-02 DIAGNOSIS — Z8249 Family history of ischemic heart disease and other diseases of the circulatory system: Secondary | ICD-10-CM | POA: Insufficient documentation

## 2023-04-02 DIAGNOSIS — Z9089 Acquired absence of other organs: Secondary | ICD-10-CM | POA: Insufficient documentation

## 2023-04-02 DIAGNOSIS — Z801 Family history of malignant neoplasm of trachea, bronchus and lung: Secondary | ICD-10-CM | POA: Insufficient documentation

## 2023-04-02 DIAGNOSIS — N200 Calculus of kidney: Secondary | ICD-10-CM | POA: Diagnosis not present

## 2023-04-02 DIAGNOSIS — Z822 Family history of deafness and hearing loss: Secondary | ICD-10-CM | POA: Diagnosis not present

## 2023-04-02 DIAGNOSIS — C539 Malignant neoplasm of cervix uteri, unspecified: Secondary | ICD-10-CM

## 2023-04-02 DIAGNOSIS — Z8349 Family history of other endocrine, nutritional and metabolic diseases: Secondary | ICD-10-CM | POA: Insufficient documentation

## 2023-04-02 DIAGNOSIS — Z923 Personal history of irradiation: Secondary | ICD-10-CM | POA: Diagnosis not present

## 2023-04-02 DIAGNOSIS — Z818 Family history of other mental and behavioral disorders: Secondary | ICD-10-CM | POA: Diagnosis not present

## 2023-04-02 DIAGNOSIS — Z79899 Other long term (current) drug therapy: Secondary | ICD-10-CM | POA: Insufficient documentation

## 2023-04-02 DIAGNOSIS — Z79890 Hormone replacement therapy (postmenopausal): Secondary | ICD-10-CM | POA: Insufficient documentation

## 2023-04-02 DIAGNOSIS — Z803 Family history of malignant neoplasm of breast: Secondary | ICD-10-CM | POA: Insufficient documentation

## 2023-04-02 DIAGNOSIS — Z825 Family history of asthma and other chronic lower respiratory diseases: Secondary | ICD-10-CM | POA: Diagnosis not present

## 2023-04-02 DIAGNOSIS — Z95828 Presence of other vascular implants and grafts: Secondary | ICD-10-CM

## 2023-04-02 LAB — CBC WITH DIFFERENTIAL (CANCER CENTER ONLY)
Abs Immature Granulocytes: 0.01 10*3/uL (ref 0.00–0.07)
Basophils Absolute: 0 10*3/uL (ref 0.0–0.1)
Basophils Relative: 1 %
Eosinophils Absolute: 0.1 10*3/uL (ref 0.0–0.5)
Eosinophils Relative: 2 %
HCT: 34.9 % — ABNORMAL LOW (ref 36.0–46.0)
Hemoglobin: 12.7 g/dL (ref 12.0–15.0)
Immature Granulocytes: 0 %
Lymphocytes Relative: 40 %
Lymphs Abs: 1.5 10*3/uL (ref 0.7–4.0)
MCH: 33.7 pg (ref 26.0–34.0)
MCHC: 36.4 g/dL — ABNORMAL HIGH (ref 30.0–36.0)
MCV: 92.6 fL (ref 80.0–100.0)
Monocytes Absolute: 0.3 10*3/uL (ref 0.1–1.0)
Monocytes Relative: 8 %
Neutro Abs: 1.8 10*3/uL (ref 1.7–7.7)
Neutrophils Relative %: 49 %
Platelet Count: 225 10*3/uL (ref 150–400)
RBC: 3.77 MIL/uL — ABNORMAL LOW (ref 3.87–5.11)
RDW: 11.7 % (ref 11.5–15.5)
WBC Count: 3.8 10*3/uL — ABNORMAL LOW (ref 4.0–10.5)
nRBC: 0 % (ref 0.0–0.2)

## 2023-04-02 MED ORDER — SODIUM CHLORIDE 0.9% FLUSH
10.0000 mL | Freq: Once | INTRAVENOUS | Status: AC
  Administered 2023-04-02: 10 mL via INTRAVENOUS
  Filled 2023-04-02: qty 10

## 2023-04-02 MED ORDER — HEPARIN SOD (PORK) LOCK FLUSH 100 UNIT/ML IV SOLN
500.0000 [IU] | Freq: Once | INTRAVENOUS | Status: AC
  Administered 2023-04-02: 500 [IU] via INTRAVENOUS
  Filled 2023-04-02: qty 5

## 2023-04-02 NOTE — Progress Notes (Signed)
 Patient here for oncology follow-up appointment, expresses no complaints or concerns at this time.

## 2023-04-05 NOTE — Progress Notes (Signed)
 Hematology/Oncology Consult note [facility]  [phone] [phone]  Patient Care Team: Ostwalt, Janna, PA-C as PCP - General (Physician Assistant) Patient, No Pcp Per (General Practice) Maurie Rayfield BIRCH, RN as Oncology Nurse Navigator Melanee Annah BROCKS, MD as Consulting Physician (Oncology) Mancil Barter, MD as Referring Physician (Obstetrics) Lurline Junzo Paul, MD as Referring Physician (Radiation Oncology) Lenn Aran, MD as Consulting Physician (Radiation Oncology)   Name of the patient: Devota Mclin  [id number]  26-Oct-1990   Date of visit: 04/05/23  Diagnosis- 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  She was seen by GYN and had a cervical exam which showed a large pink friable vascularized irregular growth in the cervix extending from 11:00 to 5 o'clock position.  Pap showed ASCUS and high risk HPV.  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  She is married and uses NuvaRing for contraception.  She does not have any children yet.  She has had 2 Gardasil vaccinations in the past but did not receive a booster patient was seen by GYN oncology Dr. Mancil and plan was to obtain a PET CT scan to decide if she would be a candidate for surgery.  PET scan showed hypermetabolic activity of the cervix compatible with primary malignancy.  Hypermetabolic subcentimeter bilateral external iliac lymph nodes concerning for metastatic disease.  Hypermetabolic left inguinal lymph node concerning for additional site of metastatic disease.  Bilateral nonobstructing renal stones.    Plan is to proceed with definitive concurrent chemoradiation with weekly cisplatin  which she stopped on 07/07/2021.  She received vaginal brachytherapy at [facility] in July 2023.    Interval history- doing well overall. Denies any abdominal pain. No changes in appetite. No recurrent UTI's. She is on HRT and is sexually active  ECOG PS- 0 Pain scale- 0   Review of systems- Review of Systems  Constitutional:  Negative for chills, fever, malaise/fatigue and weight loss.  HENT:  Negative for congestion, ear discharge and nosebleeds.   Eyes:  Negative for blurred vision.  Respiratory:  Negative for cough, hemoptysis, sputum production, shortness of breath and wheezing.   Cardiovascular:  Negative for chest pain, palpitations, orthopnea and claudication.  Gastrointestinal:  Negative for abdominal pain, blood in stool, constipation, diarrhea, heartburn, melena, nausea and vomiting.  Genitourinary:  Negative for dysuria, flank pain, frequency, hematuria and urgency.  Musculoskeletal:  Negative for back pain, joint pain and myalgias.  Skin:  Negative for rash.  Neurological:  Negative for dizziness, tingling, focal weakness, seizures, weakness and headaches.  Endo/Heme/Allergies:  Does not bruise/bleed easily.  Psychiatric/Behavioral:  Negative for depression and suicidal ideas. The patient does not have insomnia.       No Known Allergies   Past Medical History:  Diagnosis Date   Allergic rhinitis    Allergy    Anxiety 2016   Cervical cancer [facility])      Past Surgical History:  Procedure Laterality Date   IR IMAGING GUIDED PORT INSERTION  06/30/2021   TONSILECTOMY, ADENOIDECTOMY, BILATERAL MYRINGOTOMY AND TUBES Bilateral    TONSILLECTOMY     WISDOM TOOTH EXTRACTION      Social History   Socioeconomic History  Marital status: Married    Spouse name: Not on file   Number of children: Not on file   Years of education: Not on file   Highest education level: Associate degree: academic  program  Occupational History   Not on file  Tobacco Use   Smoking status: Never   Smokeless tobacco: Never  Vaping Use   Vaping status: Never Used  Substance and Sexual Activity   Alcohol use: Yes    Alcohol/week: 1.0 standard drink of alcohol    Types: 1 Glasses of wine per week    Comment: occasional--every couple of weeks.   Drug use: No   Sexual activity: Yes    Birth control/protection: None  Other Topics Concern   Not on file  Social History Narrative   Not on file   Social Drivers of Health   Financial Resource Strain: Low Risk  (10/19/2022)   Overall Financial Resource Strain (CARDIA)    Difficulty of Paying Living Expenses: Not hard at all  Food Insecurity: No Food Insecurity (10/19/2022)   Hunger Vital Sign    Worried About Running Out of Food in the Last Year: Never true    Ran Out of Food in the Last Year: Never true  Transportation Needs: No Transportation Needs (10/19/2022)   PRAPARE - Administrator, Civil Service (Medical): No    Lack of Transportation (Non-Medical): No  Physical Activity: Insufficiently Active (10/19/2022)   Exercise Vital Sign    Days of Exercise per Week: 2 days    Minutes of Exercise per Session: 30 min  Stress: No Stress Concern Present (10/19/2022)   [employer] of Occupational Health - Occupational Stress Questionnaire    Feeling of Stress : Only a little  Social Connections: Moderately Isolated (10/19/2022)   Social Connection and Isolation Panel [NHANES]    Frequency of Communication with Friends and Family: More than three times a week    Frequency of Social Gatherings with Friends and Family: Three times a week    Attends Religious Services: Never    Active Member of Clubs or Organizations: No    Attends Engineer, Structural: Not on file    Marital Status: Married  Catering Manager Violence: Not on file    Family History  Problem Relation Age of Onset   Depression Mother    Hearing loss Mother     Kidney disease Mother    Miscarriages / Stillbirths Mother    Obesity Mother    Heart disease Father    ADD / ADHD Father    Breast cancer Maternal Grandmother    Lung cancer Paternal Grandmother    Cancer Paternal Grandmother    COPD Paternal Grandmother    Heart attack Paternal Grandfather    ADD / ADHD Brother      Current Outpatient Medications:    aspirin-acetaminophen -caffeine (EXCEDRIN MIGRAINE) 250-250-65 MG tablet, Take by mouth every 6 (six) hours as needed for headache., Disp: , Rfl:    estradiol  (ESTRACE  VAGINAL) 0.1 MG/GM vaginal cream, Place 1 Applicatorful vaginally at bedtime. For 2 weeks then 3 nights a week., Disp: 42.5 g, Rfl: 12   ibuprofen  (ADVIL ) 800 MG tablet, Take 1 tablet (800 mg total) by mouth every 8 (eight) hours as needed., Disp: 30 tablet, Rfl: 0   lidocaine  (XYLOCAINE ) 5 % ointment, Apply 1 Application topically as needed (Bring to appointment for biopsy.)., Disp: 35.44 g, Rfl: 0   lidocaine -prilocaine  (EMLA ) cream, APPLY TOPICALLY TO THE AFFECTED AREA 1 TIME,  Disp: 30 g, Rfl: 3   methylPREDNISolone (MEDROL DOSEPAK) 4 MG TBPK tablet, Take by mouth as directed., Disp: , Rfl:    Norethindrone -Ethinyl Estradiol -Fe Biphas (LO LOESTRIN FE) 1 MG-10 MCG / 10 MCG tablet, Take 1 tablet by mouth daily. Take pills continuously for 28 day cycle for hormonal replacement, Disp: 28 tablet, Rfl: 11   oxyCODONE -acetaminophen  (PERCOCET) 5-325 MG tablet, Take 2 tablets by mouth every 8 (eight) hours as needed for severe pain (pain score 7-10)., Disp: 16 tablet, Rfl: 0   rizatriptan (MAXALT) 10 MG tablet, Take 10 mg by mouth as needed for migraine. May repeat in 2 hours if needed, Disp: , Rfl:    Semaglutide -Weight Management 1.7 MG/0.75ML SOAJ, Inject 1.7 mg into the skin once a week., Disp: 3 mL, Rfl: 0   venlafaxine (EFFEXOR) 37.5 MG tablet, Start taking Effexor 37.5 mg daily for one week, then increase to 37.5 mg twice per day for headaches., Disp: , Rfl:    ondansetron   (ZOFRAN -ODT) 4 MG disintegrating tablet, Take 1 tablet (4 mg total) by mouth every 6 (six) hours as needed for nausea or vomiting. (Patient not taking: Reported on 04/02/2023), Disp: 20 tablet, Rfl: 0   tamsulosin  (FLOMAX ) 0.4 MG CAPS capsule, Take 1 capsule (0.4 mg total) by mouth daily. (Patient not taking: Reported on 04/02/2023), Disp: 30 capsule, Rfl: 0 No current facility-administered medications for this visit.  Facility-Administered Medications Ordered in Other Visits:    sodium chloride  flush (NS) 0.9 % injection 10 mL, 10 mL, Intravenous, PRN, Melanee Annah BROCKS, MD, 10 mL at 07/07/21 0841  Physical exam:  Vitals:   04/02/23 1047  BP: 128/89  Pulse: 68  Resp: 17  Temp: (!) 97.3 F (36.3 C)  TempSrc: Tympanic  SpO2: 100%  Weight: 185 lb (83.9 kg)   Physical Exam Cardiovascular:     Rate and Rhythm: Normal rate and regular rhythm.     Heart sounds: Normal heart sounds.  Pulmonary:     Effort: Pulmonary effort is normal.     Breath sounds: Normal breath sounds.  Abdominal:     General: Bowel sounds are normal.     Palpations: Abdomen is soft.  Skin:    General: Skin is warm and dry.  Neurological:     Mental Status: She is alert and oriented to person, place, and time.         Latest Ref Rng & Units 12/15/2022   10:27 PM  CMP  Glucose 70 - 99 mg/dL 92   BUN 6 - 20 mg/dL 12   Creatinine 9.55 - 1.00 mg/dL 9.13   Sodium 864 - 854 mmol/L 137   Potassium 3.5 - 5.1 mmol/L 3.7   Chloride 98 - 111 mmol/L 104   CO2 22 - 32 mmol/L 23   Calcium 8.9 - 10.3 mg/dL 9.7       Latest Ref Rng & Units 04/02/2023   10:15 AM  CBC  WBC 4.0 - 10.5 K/uL 3.8   Hemoglobin 12.0 - 15.0 g/dL 87.2   Hematocrit 63.9 - 46.0 % 34.9   Platelets 150 - 400 K/uL 225      Assessment and plan- Patient is a 33 y.o. female  With history of stage III HPV positive cervical cancer s/p concurrent chemoradiation. She is currently in remission and this is a routine f/u visit  Clinically patient is doing  well with no concerning signs and symptoms of recurrent disease based on today's exam.  She is following up with GYN  oncology every 2 months for pelvic exam and Pap smear.  In July 2024 she was found to have high-grade SIL and radiation effects.  PET scan was negative for recurrence.  Given that she will be following up with GYN oncology every 2 months she does not require any follow-up with me at this time.  She can be referred to me in the future if questions or concerns arise.  Her port will be taken out next week     Visit Diagnosis 1. Encounter for follow-up surveillance of cervical cancer      Dr. Annah Skene, MD, MPH [facility] [phone] 04/05/2023 6:42 PM

## 2023-04-06 ENCOUNTER — Ambulatory Visit
Admission: RE | Admit: 2023-04-06 | Discharge: 2023-04-06 | Disposition: A | Discharge status: Home / Self Care | Payer: BC Managed Care – PPO | Source: Ambulatory Visit | Attending: Nurse Practitioner | Admitting: Nurse Practitioner

## 2023-04-06 DIAGNOSIS — Z452 Encounter for adjustment and management of vascular access device: Secondary | ICD-10-CM | POA: Diagnosis not present

## 2023-04-06 DIAGNOSIS — Z95828 Presence of other vascular implants and grafts: Secondary | ICD-10-CM

## 2023-04-06 DIAGNOSIS — Z8541 Personal history of malignant neoplasm of cervix uteri: Secondary | ICD-10-CM | POA: Diagnosis not present

## 2023-04-06 HISTORY — PX: IR REMOVAL TUN ACCESS W/ PORT W/O FL MOD SED: IMG2290

## 2023-04-07 ENCOUNTER — Other Ambulatory Visit (HOSPITAL_COMMUNITY): Payer: Self-pay

## 2023-05-18 ENCOUNTER — Encounter: Payer: Self-pay | Admitting: Physician Assistant

## 2023-05-18 DIAGNOSIS — E66811 Obesity, class 1: Secondary | ICD-10-CM

## 2023-05-19 DIAGNOSIS — F40298 Other specified phobia: Secondary | ICD-10-CM | POA: Diagnosis not present

## 2023-05-19 DIAGNOSIS — H53149 Visual discomfort, unspecified: Secondary | ICD-10-CM | POA: Diagnosis not present

## 2023-05-19 DIAGNOSIS — G43009 Migraine without aura, not intractable, without status migrainosus: Secondary | ICD-10-CM | POA: Diagnosis not present

## 2023-05-19 DIAGNOSIS — Z8541 Personal history of malignant neoplasm of cervix uteri: Secondary | ICD-10-CM | POA: Diagnosis not present

## 2023-05-20 MED ORDER — SEMAGLUTIDE-WEIGHT MANAGEMENT 1.7 MG/0.75ML ~~LOC~~ SOAJ
1.7000 mg | SUBCUTANEOUS | 0 refills | Status: DC
Start: 2023-05-20 — End: 2023-07-05

## 2023-05-26 ENCOUNTER — Inpatient Hospital Stay: Payer: BC Managed Care – PPO | Attending: Oncology | Admitting: Obstetrics and Gynecology

## 2023-05-26 VITALS — BP 109/82 | HR 71 | Temp 98.7°F | Resp 20 | Wt 171.7 lb

## 2023-05-26 DIAGNOSIS — Z801 Family history of malignant neoplasm of trachea, bronchus and lung: Secondary | ICD-10-CM | POA: Insufficient documentation

## 2023-05-26 DIAGNOSIS — Z8349 Family history of other endocrine, nutritional and metabolic diseases: Secondary | ICD-10-CM | POA: Insufficient documentation

## 2023-05-26 DIAGNOSIS — Z79899 Other long term (current) drug therapy: Secondary | ICD-10-CM | POA: Insufficient documentation

## 2023-05-26 DIAGNOSIS — R87613 High grade squamous intraepithelial lesion on cytologic smear of cervix (HGSIL): Secondary | ICD-10-CM | POA: Diagnosis not present

## 2023-05-26 DIAGNOSIS — E669 Obesity, unspecified: Secondary | ICD-10-CM | POA: Diagnosis not present

## 2023-05-26 DIAGNOSIS — F419 Anxiety disorder, unspecified: Secondary | ICD-10-CM | POA: Insufficient documentation

## 2023-05-26 DIAGNOSIS — Z8249 Family history of ischemic heart disease and other diseases of the circulatory system: Secondary | ICD-10-CM | POA: Insufficient documentation

## 2023-05-26 DIAGNOSIS — Z9089 Acquired absence of other organs: Secondary | ICD-10-CM | POA: Diagnosis not present

## 2023-05-26 DIAGNOSIS — G935 Compression of brain: Secondary | ICD-10-CM | POA: Diagnosis not present

## 2023-05-26 DIAGNOSIS — R109 Unspecified abdominal pain: Secondary | ICD-10-CM | POA: Diagnosis not present

## 2023-05-26 DIAGNOSIS — R87610 Atypical squamous cells of undetermined significance on cytologic smear of cervix (ASC-US): Secondary | ICD-10-CM | POA: Diagnosis not present

## 2023-05-26 DIAGNOSIS — Z9221 Personal history of antineoplastic chemotherapy: Secondary | ICD-10-CM | POA: Insufficient documentation

## 2023-05-26 DIAGNOSIS — Z818 Family history of other mental and behavioral disorders: Secondary | ICD-10-CM | POA: Diagnosis not present

## 2023-05-26 DIAGNOSIS — Z6827 Body mass index (BMI) 27.0-27.9, adult: Secondary | ICD-10-CM | POA: Diagnosis not present

## 2023-05-26 DIAGNOSIS — Z803 Family history of malignant neoplasm of breast: Secondary | ICD-10-CM | POA: Diagnosis not present

## 2023-05-26 DIAGNOSIS — Z8541 Personal history of malignant neoplasm of cervix uteri: Secondary | ICD-10-CM | POA: Diagnosis not present

## 2023-05-26 DIAGNOSIS — N202 Calculus of kidney with calculus of ureter: Secondary | ICD-10-CM | POA: Insufficient documentation

## 2023-05-26 DIAGNOSIS — C539 Malignant neoplasm of cervix uteri, unspecified: Secondary | ICD-10-CM

## 2023-05-26 DIAGNOSIS — Z923 Personal history of irradiation: Secondary | ICD-10-CM | POA: Diagnosis not present

## 2023-05-26 DIAGNOSIS — E28319 Asymptomatic premature menopause: Secondary | ICD-10-CM | POA: Diagnosis not present

## 2023-05-26 MED ORDER — ESTRADIOL 0.1 MG/GM VA CREA: 1.0000 | TOPICAL_CREAM | Freq: Every day | VAGINAL | 12 refills | Status: DC

## 2023-05-26 NOTE — Progress Notes (Signed)
 Gynecologic Oncology Interval Visit   Referring Provider: Dr Dalbert Garnet  Chief Concern: cervical cancer, surveillance visit Subjective:  Dawn Camacho is a 33 y.o. G0 female who is seen in consultation from Dr Dalbert Garnet for cervical cancer.  No bleeding or other complaints since last visit.  She is sexually active, but not recently, and using vaginal dilator.   PAP 1/25 unsatisfactory.  HR HPV +  Gynecologic Oncology History Dawn Camacho was seen in consultation from Dr. Dr Dalbert Garnet for cervical cancer.  05/08/21 seen by Midwife for post coital spotting x 1.5 months. Cervical exam: Large, pink, friable and highly vascularized, non-tender, not smooth, irregular cervical growth extending from 11-5.   PAP ASCUS-HR/HPV+. Patient's last menstrual period was 04/29/2021.    Colposcopy confirmed irregular cervical growth extending from 11-5:00. 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   06/12/2021 MRI Pelvis IMPRESSION: 1. 4.2 x 3.7 x 2.9 cm cervical mass involving the anterior aspect of the cervix and also likely involving the anterior aspect of the lower uterine segment. This extends into the vagina mainly on the left side. No MR findings to suggest involvement of the bladder, urethra or rectum. 2. Borderline enlarged bilateral pelvic sidewall lymph nodes worrisome for metastatic disease.  06/19/2021 PET scan IMPRESSION: 1. Hypermetabolic activity of the cervix, compatible with primary malignancy. 2. Hypermetabolic subcentimeter bilateral external iliac lymph nodes, concerning for metastatic disease. 3. Mildly hypermetabolic left inguinal lymph node, concerning for additional site of metastatic disease. 4. Bilateral nonobstructing renal stones.  06/30/2021 Port placement  60Gy to cervix and nodes at [facility] under the care of Dr. Rushie Chestnut in 07/10/2021-08/29/2021. T&O at [facility] with Dr. Marinell Blight.  7/10 - 09/15/21 Cervix, T&O/R Total Dose 2400 cGy (each fraction  600 cGy)  Post treatment PET 04/09/22 was negative.   12/17/2021 Dilator teaching to avoid vaginal stenosis, performed at [facility].   Married and used Paediatric nurse for contraception.  Has not had a strong desire for children, but not sure for certain.   Had 2 Gardesil vaccinations, but then last in the series was not done due to recall or some other problem.  06/24/22- HIV negative      11/19/2021 PET scan IMPRESSION: 1. Complete metabolic response to therapy of cervical primary and pelvic nodal metastasis. 2. Bilateral nephrolithiasis.  02/24/2022 Brain MRI performed by Neurology for history of headaches IMPRESSION: Borderline Chiari 1 with slight crowding of the craniocervical junction. Otherwise, normal brain MRI.  04/09/2022 PET  IMPRESSION: No evidence of hypermetabolic recurrent or metastatic disease.  06/24/22 for surveillance. Pap was obtained but was insufficient for testing. Additionally, she had post coital spotting. Granulation tissue on cervix was observed and thought to be etiology. She was treated with silver nitrate. HPV was positive. It was recollected on 07/22/22 and reported as EPCA, ASCUS-H, HR HPV Positive.   08/05/22 colposcopy and biopsy with Dr Sonia Side.  Colposcopy of the upper vagina and cervix was performed after application os acetic acid. The findings revealed cervical erythema and most prominent at 5-6 o'clock. A slightly raised friable area on the left vagina around 5 o'clock by the cervix. WE lateral left wall. The transformation zone was/was not seen.  .  CERVIX, 6:00; BIOPSY:  - INFLAMED GRANULATION TISSUE AND SEPARATE FRAGMENTS OF NECROINFLAMMATORY MATERIAL.  - AN EPITHELIAL SURFACE IS NOT PRESENT.  - STROMAL CHANGES CONSISTENT WITH PRIOR RADIATION THERAPY.  - NEGATIVE FOR DYSPLASIA AND MALIGNANCY.  Recommended RTC for ECC and possible additional biopsies.   09/23/2022  Endocervix,  curettage - HIGH-GRADE SQUAMOUS INTRAEPITHELIAL LESION (HSIL).  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 The specimen was processed as a cell block due to the limited amount of material on gross examination. Sections demonstrate an extremely scant specimen with rare atypical cells that are p16 positive. Deeper sections were examined. 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 Features of superimposed radiation therapy effect are noted.  MRI 10/06/22 FINDINGS: Urinary Tract:  Bladder is within normal limits.   Bowel:  Visualized bowel is grossly unremarkable.   Vascular/Lymphatic: No evidence of aneurysm.   No suspicious pelvic lymphadenopathy.   Reproductive: No convincing residual cervical mass. Mild hypoperfusion along the right lateral aspect of the lower uterine segment/cervix on delayed imaging (series 26/image 40), but without associated restricted diffusion or other findings suggestive tumor.   Bilateral ovaries are within normal limits.   Other:  Small volume pelvic ascites.   Musculoskeletal: No focal osseous lesions.   IMPRESSION: No MR findings to suggest cervical cancer recurrence. No evidence of metastatic disease in the pelvis.  PET scan 10/14/22 FINDINGS: Mediastinal blood pool activity: SUV max 2.8   Liver activity: SUV max NA   NECK: No hypermetabolic cervical lymphadenopathy.   Incidental CT findings: None.   CHEST: No hypermetabolic thoracic lymphadenopathy.   No suspicious pulmonary nodules.   Right chest port terminates at the cavoatrial junction.   Incidental CT findings: None.   ABDOMEN/PELVIS: No abnormal hypermetabolism in the liver, spleen, pancreas, or adrenal glands.   No hypermetabolic abdominopelvic lymphadenopathy. Small bilateral inguinal nodes, likely reactive.   No focal cervical hypermetabolism to suggest recurrence on PET.   Incidental CT findings: Five nonobstructing right renal  calculi measuring up to 5 mm. No hydronephrosis. Trace pelvic fluid/ascites.   SKELETON: No focal hypermetabolic activity to suggest skeletal metastasis.   Incidental CT findings: None.   IMPRESSION: No findings suspicious for recurrent or metastatic disease.    In ED 12/15/22 for right flank pain thought to be due to chronic kidney stones.  Stone has not passed, but feeling better, no pain.  CT scan renal protocol FINDINGS: Lower chest: No acute abnormality.   Hepatobiliary: No focal liver abnormality is seen. No gallstones, gallbladder wall thickening, or biliary dilatation.   Pancreas: Unremarkable. No pancreatic ductal dilatation or surrounding inflammatory changes.   Spleen: Normal in size without focal abnormality.   Adrenals/Urinary Tract: Adrenal glands are within normal limits. Left kidney shows punctate lower pole renal stone without obstructive change. Right kidney demonstrates moderate hydronephrosis secondary to a 4 mm stone in the proximal right ureter. Multiple nonobstructing right renal stones are noted as well measuring up to 4 mm. More distal right ureter is unremarkable. The bladder is decompressed.   Stomach/Bowel: Stomach is within normal limits. Appendix appears normal. No evidence of bowel wall thickening, distention, or inflammatory changes.   Vascular/Lymphatic: No significant vascular findings are present. No enlarged abdominal or pelvic lymph nodes.   Reproductive: Uterus and bilateral adnexa are unremarkable.   Other: No abdominal wall hernia or abnormality. No abdominopelvic ascites.   Musculoskeletal: No acute or significant osseous findings.   IMPRESSION: 4 mm proximal right ureteral stone with obstructive change.   Bilateral nonobstructing renal calculi as described.   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 07/22/2022   Cervical cancer, FIGO stage III (HCC) 06/24/2021   Cervical cancer (HCC) 06/11/2021   Dyshydrosis 11/21/2015    Past Medical History: Past Medical History:  Diagnosis Date   Allergic rhinitis    Allergy    Anxiety 2016   Cervical cancer (HCC)     Past Surgical History: Past Surgical History:  Procedure Laterality Date   IR IMAGING GUIDED PORT INSERTION  06/30/2021   IR REMOVAL TUN ACCESS W/ PORT W/O FL MOD SED  04/06/2023   TONSILECTOMY, ADENOIDECTOMY, BILATERAL MYRINGOTOMY AND TUBES Bilateral    TONSILLECTOMY     WISDOM TOOTH EXTRACTION       Family History: Family History  Problem Relation Age of Onset   Depression Mother    Hearing loss Mother    Kidney disease Mother    Miscarriages / [place] Mother    Obesity Mother    Heart disease Father    ADD / ADHD Father    Breast cancer Maternal Grandmother    Lung cancer Paternal Grandmother    Cancer Paternal Grandmother    COPD Paternal Grandmother    Heart attack Paternal Grandfather    ADD / ADHD Brother     Social History: Social History   Socioeconomic History   Marital status: Married    Spouse name: Not on file   Number of children: Not on file   Years of education: Not on file   Highest education level: Associate degree: academic program  Occupational History   Not on file  Tobacco Use   Smoking status: Never   Smokeless tobacco: Never  Vaping Use   Vaping status: Never Used  Substance and Sexual Activity   Alcohol use: Yes    Alcohol/week: 1.0 standard drink of alcohol    Types: 1 Glasses of wine per week    Comment: occasional--every couple of weeks.   Drug use: No   Sexual activity: Yes    Birth control/protection:  None  Other Topics Concern   Not on file  Social History Narrative   Not on file   Social Drivers of Health   Financial Resource Strain: Low Risk  (10/19/2022)   Overall Financial Resource Strain (CARDIA)    Difficulty of Paying Living Expenses: Not hard at all  Food Insecurity: No Food Insecurity (10/19/2022)   Hunger Vital Sign    Worried About Running Out of Food in the Last Year: Never true    Ran Out of Food in the Last Year: Never true  Transportation Needs: No Transportation Needs (10/19/2022)   PRAPARE - Administrator, Civil Service (Medical): No    Lack of Transportation (Non-Medical): No  Physical Activity: Insufficiently Active (10/19/2022)   Exercise Vital Sign    Days of Exercise per Week: 2 days    Minutes of Exercise per Session: 30 min  Stress: No Stress Concern Present (10/19/2022)   [employer] of Occupational Health - Occupational Stress Questionnaire    Feeling of Stress : Only a little  Social Connections: Moderately Isolated (10/19/2022)   Social Connection and Isolation Panel [NHANES]    Frequency of Communication with Friends and Family: More than three times a week    Frequency of Social Gatherings with Friends and Family: Three times a week  Attends Religious Services: Never    Active Member of Clubs or Organizations: No    Attends Engineer, structural: Not on file    Marital Status: Married  Catering manager Violence: Not on file    Allergies: No Known Allergies  Current Medications: Current Outpatient Medications  Medication Sig Dispense Refill   aspirin-acetaminophen-caffeine (EXCEDRIN MIGRAINE) 250-250-65 MG tablet Take by mouth every 6 (six) hours as needed for headache.     estradiol (ESTRACE VAGINAL) 0.1 MG/GM vaginal cream Place 1 Applicatorful vaginally at bedtime. For 2 weeks then 3 nights a week. 42.5 g 12   ibuprofen (ADVIL) 800 MG tablet Take 1 tablet (800 mg total) by mouth every 8 (eight) hours as needed. 30  tablet 0   lidocaine (XYLOCAINE) 5 % ointment Apply 1 Application topically as needed (Bring to appointment for biopsy.). 35.44 g 0   lidocaine-prilocaine (EMLA) cream APPLY TOPICALLY TO THE AFFECTED AREA 1 TIME 30 g 3   methylPREDNISolone (MEDROL DOSEPAK) 4 MG TBPK tablet Take by mouth as directed.     Norethindrone-Ethinyl Estradiol-Fe Biphas (LO LOESTRIN FE) 1 MG-10 MCG / 10 MCG tablet Take 1 tablet by mouth daily. Take pills continuously for 28 day cycle for hormonal replacement 28 tablet 11   ondansetron (ZOFRAN-ODT) 4 MG disintegrating tablet Take 1 tablet (4 mg total) by mouth every 6 (six) hours as needed for nausea or vomiting. (Patient not taking: Reported on 04/02/2023) 20 tablet 0   oxyCODONE-acetaminophen (PERCOCET) 5-325 MG tablet Take 2 tablets by mouth every 8 (eight) hours as needed for severe pain (pain score 7-10). 16 tablet 0   rizatriptan (MAXALT) 10 MG tablet Take 10 mg by mouth as needed for migraine. May repeat in 2 hours if needed     Semaglutide-Weight Management 1.7 MG/0.75ML SOAJ Inject 1.7 mg into the skin once a week. 3 mL 0   tamsulosin (FLOMAX) 0.4 MG CAPS capsule Take 1 capsule (0.4 mg total) by mouth daily. (Patient not taking: Reported on 04/02/2023) 30 capsule 0   venlafaxine (EFFEXOR) 37.5 MG tablet Start taking Effexor 37.5 mg daily for one week, then increase to 37.5 mg twice per day for headaches.     No current facility-administered medications for this visit.   Facility-Administered Medications Ordered in Other Visits  Medication Dose Route Frequency Provider Last Rate Last Admin   sodium chloride flush (NS) 0.9 % injection 10 mL  10 mL Intravenous PRN Creig Hines, MD   10 mL at 07/07/21 0841   Review of Systems General:  no complaints Skin: no complaints Eyes: no complaints HEENT: no complaints Breasts: no complaints Pulmonary: no complaints Cardiac: no complaints Gastrointestinal: no complaints Genitourinary/Sexual: no complaints Ob/Gyn: no  complaints Musculoskeletal: no complaints Hematology: no complaints Neurologic/Psych: no complaints   Objective:  Physical Examination:  BP 109/82   Pulse 71   Temp 98.7 F (37.1 C)   Resp 20   Wt 171 lb 11.2 oz (77.9 kg)   SpO2 100%   BMI 27.71 kg/m    ECOG Performance Status: 0 - Asymptomatic  GENERAL: Patient is a well appearing female in no acute distress HEENT: Atraumatic normocephalic.  LUNGS:  Nornal respiratory effort.  ABDOMEN:  Soft, nontender. Nondistended.  No masses or ascites EXTREMITIES:  No peripheral edema.   NEURO:  Nonfocal. Well oriented.  Appropriate affect.  Pelvic: Chaperoned with CMA EGBUS: no lesions Cervix:  flush, no lesions Vagina: no lesions, but upper vagina again agglutinated. Broke up adhesions and upper vagina  and cervix looks atrophic.  Not able to advance PAP cytobrush into canal.   Uterus: not grossly enlarged; seems to be normal size but exam limited, nontender, mobile BME: no palpable masses, parametria is smooth and there is no nodularity Rectovaginal: deferred.   Lab Review No labs on site today  Assessment:  Dawn Camacho is a 33 y.o. G0 female diagnosed with IIIC1 invasive squamous cell cervical cancer 3/23 with 4.2 cm cervical tumor and positive external iliac nodes on PET scan s/p chemoradiation with negative posttreatment PET.    Vaginal bleeding 2/24 and PET/CT negative.  Abnormal Pap with ASC-H and HR HPV positive 5/24.  Colposcopic vaginal biopsy 6/24 showed just inflammation.  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 Pelvic MRI and PET scan 8/24 negative for recurrence. Plan for close surveillance with PAPs and imaging.    Kidney stone in 10/24 and still has not passed, but no symptoms.   No bleeding, pain or discharge recently.  Today upper vagina agglutinated. Broke up filmy adhesions in upper vagina again and cervix does not looking concerning.  She has a dilator and is sexually active.       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   Port-a-cath in place.  Peripheral neuropathy has resolved.   Medical co-morbidities complicating care: anxiety.  Plan:   Problem List Items Addressed This Visit       Genitourinary   Cervical cancer [facility]) - Primary    Discussed that with PET and MRI negative for cancer in the cervix 8/24 recommend close surveillance with exams q2 months.  LEEP/[facility] would be technically challenging and the cellular changes on cytology and biopsy may be reactive due to radiation.  Hysterectomy would be an option if there is more concern for recurrence in the cervix, but risk of complications including fistula are significant post full radiation for cervical cancer.   PAP 1/25 unsatisfactory.  HR HPV+ PAP repeated today after breaking down upper vaginal adhesions.    Will have her use estrogen cream again and dilator every day see her back in 6 weeks to reassess agglutination.  If concerning or new symptoms can repeat imaging.    Continue LOESTRIN. Bone Health-  calcium 1200 mg and vitamin D 1000 iu daily for prevention of bone loss. Encouraged weight bearing exercise life long.    IV access port a cath removed.   Cancer screenings and wellness - she will continue to see her pcp for cancer screenings and ongoing wellness.   The patient's diagnosis, an outline of the further diagnostic and laboratory studies which will be required, the recommendation, and alternatives were discussed.  All questions were answered to the patient's satisfaction.  Leida Lauth, MD

## 2023-06-08 LAB — IGP, APTIMA HPV: HPV Aptima: POSITIVE — AB

## 2023-06-14 ENCOUNTER — Telehealth: Payer: Self-pay | Admitting: Nurse Practitioner

## 2023-06-14 NOTE — Telephone Encounter (Signed)
 Called patient to review pap. No answer. Left vm. Pap did not reveal cellular material which is same result as previous collection. HPV positive. Plan to follow up as scheduled in May.

## 2023-07-05 ENCOUNTER — Ambulatory Visit: Admitting: Physician Assistant

## 2023-07-05 ENCOUNTER — Encounter: Payer: Self-pay | Admitting: Physician Assistant

## 2023-07-05 VITALS — BP 124/81 | HR 61 | Resp 16 | Ht 66.0 in | Wt 169.0 lb

## 2023-07-05 DIAGNOSIS — E7800 Pure hypercholesterolemia, unspecified: Secondary | ICD-10-CM | POA: Insufficient documentation

## 2023-07-05 DIAGNOSIS — D229 Melanocytic nevi, unspecified: Secondary | ICD-10-CM | POA: Diagnosis not present

## 2023-07-05 DIAGNOSIS — E66811 Obesity, class 1: Secondary | ICD-10-CM | POA: Diagnosis not present

## 2023-07-05 DIAGNOSIS — K5909 Other constipation: Secondary | ICD-10-CM

## 2023-07-05 MED ORDER — SEMAGLUTIDE-WEIGHT MANAGEMENT 1.7 MG/0.75ML ~~LOC~~ SOAJ: 1.7000 mg | SUBCUTANEOUS | 1 refills | Status: DC

## 2023-07-05 NOTE — Progress Notes (Signed)
 Established patient visit  Patient: Dawn Camacho   DOB: 09-11-1990   33 y.o. Female  MRN: [id number] Visit Date: 07/05/2023  Today's healthcare provider: Blane Bunting, PA-C   Chief Complaint  Patient presents with   Medication Refill    Med refill no other concerns. No to all vaccines   Subjective      Discussed the use of AI scribe software for clinical note transcription with the patient, who gave verbal consent to proceed.  History of Present Illness Dawn Camacho is a 33 year old female who presents for follow-up on weight management and constipation.  Constipation has improved with increased hydration and consumption of fibrous foods, though the improvement is not significant. She is on wegovyand has lost 45 pounds since October, aided by regular physical activity and dietary improvements. She is currently on a dose of 1.7 and is out of medication, with concerns about insurance issues for refills.       07/05/2023    9:49 AM 11/27/2022    1:16 PM 10/20/2022    3:58 PM  Depression screen PHQ 2/9  Decreased Interest 1 0 0  Down, Depressed, Hopeless 1 0 0  PHQ - 2 Score 2 0 0  Altered sleeping 0 1   Tired, decreased energy 1 1   Change in appetite 0 0   Feeling bad or failure about yourself  0 1   Trouble concentrating 0 0   Moving slowly or fidgety/restless 0 0   Suicidal thoughts 0 0   PHQ-9 Score 3 3   Difficult doing work/chores Not difficult at all        07/05/2023    9:50 AM 11/27/2022    1:15 PM 10/20/2022    3:58 PM  GAD 7 : Generalized Anxiety Score  Nervous, Anxious, on Edge 2 2 1   Control/stop worrying 1 0 1  Worry too much - different things 0 1 1  Trouble relaxing 0 0 0  Restless 0 0 0  Easily annoyed or irritable 2 1 1   Afraid - awful might happen 0 0 1  Total GAD 7 Score 5 4 5   Anxiety Difficulty Somewhat difficult  Not difficult at all    Medications: Outpatient Medications Prior to Visit  Medication Sig    aspirin-acetaminophen -caffeine (EXCEDRIN MIGRAINE) 250-250-65 MG tablet Take by mouth every 6 (six) hours as needed for headache.   estradiol  (ESTRACE  VAGINAL) 0.1 MG/GM vaginal cream Place 1 Applicatorful vaginally at bedtime. For 2 weeks then 3 nights a week.   ibuprofen  (ADVIL ) 800 MG tablet Take 1 tablet (800 mg total) by mouth every 8 (eight) hours as needed.   lidocaine  (XYLOCAINE ) 5 % ointment Apply 1 Application topically as needed (Bring to appointment for biopsy.).   lidocaine -prilocaine  (EMLA ) cream APPLY TOPICALLY TO THE AFFECTED AREA 1 TIME   methylPREDNISolone (MEDROL DOSEPAK) 4 MG TBPK tablet Take by mouth as directed.   Norethindrone -Ethinyl Estradiol -Fe Biphas (LO LOESTRIN FE) 1 MG-10 MCG / 10 MCG tablet Take 1 tablet by mouth daily. Take pills continuously for 28 day cycle for hormonal replacement   ondansetron  (ZOFRAN -ODT) 4 MG disintegrating tablet Take 1 tablet (4 mg total) by mouth every 6 (six) hours as needed for nausea or vomiting.   oxyCODONE -acetaminophen  (PERCOCET) 5-325 MG tablet Take 2 tablets by mouth every 8 (eight) hours as needed for severe pain (pain score 7-10).   propranolol (INDERAL) 20 MG tablet Start taking propranolol for migraines. Take 10mg  twice daily for one week, then  increase to 20mg  twice daily for migraines. Monitor pulse and blood pressure while taking this. Pulse should not be lower than 60. Call if any symptoms of low blood pressure including fatigue, weakness, dizziness, etc. Do not take this medication if you have any history of COPD, asthma, heart block, or bradycardia.   rizatriptan (MAXALT) 10 MG tablet Take 10 mg by mouth as needed for migraine. May repeat in 2 hours if needed   tamsulosin  (FLOMAX ) 0.4 MG CAPS capsule Take 1 capsule (0.4 mg total) by mouth daily.   venlafaxine (EFFEXOR) 37.5 MG tablet Start taking Effexor 37.5 mg daily for one week, then increase to 37.5 mg twice per day for headaches.   [DISCONTINUED] Semaglutide -Weight  Management 1.7 MG/0.75ML SOAJ Inject 1.7 mg into the skin once a week.   Facility-Administered Medications Prior to Visit  Medication Dose Route Frequency Provider   sodium chloride  flush (NS) 0.9 % injection 10 mL  10 mL Intravenous PRN Rao, Archana C, MD    Review of Systems All negative Except see HPI       Objective    BP 124/81 (BP Location: Right Arm, Patient Position: Sitting)   Pulse 61   Resp 16   Ht 5\' 6"  (1.676 m)   Wt 169 lb (76.7 kg)   SpO2 98%   BMI 27.28 kg/m     Physical Exam Vitals reviewed.  Constitutional:      General: She is not in acute distress.    Appearance: Normal appearance. She is well-developed. She is not diaphoretic.  HENT:     Head: Normocephalic and atraumatic.  Eyes:     General: No scleral icterus.    Conjunctiva/sclera: Conjunctivae normal.  Neck:     Thyroid : No thyromegaly.  Cardiovascular:     Rate and Rhythm: Normal rate and regular rhythm.     Pulses: Normal pulses.     Heart sounds: Normal heart sounds. No murmur heard. Pulmonary:     Effort: Pulmonary effort is normal. No respiratory distress.     Breath sounds: Normal breath sounds. No wheezing, rhonchi or rales.  Musculoskeletal:     Cervical back: Neck supple.     Right lower leg: No edema.     Left lower leg: No edema.  Lymphadenopathy:     Cervical: No cervical adenopathy.  Skin:    General: Skin is warm and dry.     Findings: Lesion (in media) present. No rash.  Neurological:     Mental Status: She is alert and oriented to person, place, and time. Mental status is at baseline.  Psychiatric:        Mood and Affect: Mood normal.        Behavior: Behavior normal.      No results found for any visits on 07/05/23.      Assessment & Plan Obesity Chronic Ongoing management with wegovy . She lost 45 pounds since October. Current dose 1.7, well-tolerated. Emphasized gradual weight loss to prevent complications. Advised proper diet and exercise to enhance  medication benefits and improve health. - Refill wegovy  for a couple of months. - Monitor weight loss and adjust dose if necessary. - Emphasize importance of diet and exercise. - Perform blood work to monitor thyroid  function. - Address insurance issues if it arises.  - CBC with Differential/Platelet - Comprehensive metabolic panel with GFR - Hemoglobin A1c - Lipid panel - TSH - Semaglutide -Weight Management 1.7 MG/0.75ML SOAJ; Inject 1.7 mg into the skin once a week.  Dispense: 3  mL; Refill: 1  Elevated LDL cholesterol Chronic Managed as part of obesity treatment. Weight loss and lifestyle changes expected to improve cholesterol. - Encourage adherence to diet and exercise to aid in cholesterol management.  Constipation Chronic Improved with dietary changes, increased hydration, and fibrous foods. - Continue current dietary modifications.  Skin mole - Ambulatory referral to Dermatology Referral to dermatologist planned to ensure benign nature. - Referral to a dermatologist for evaluation. Photo of lesion in media   Orders Placed This Encounter  Procedures   CBC with Differential/Platelet   Comprehensive metabolic panel with GFR    Has the patient fasted?:   Yes   Hemoglobin A1c   Lipid panel    Has the patient fasted?:   Yes   TSH   Ambulatory referral to Dermatology    Referral Priority:   Routine    Referral Type:   Consultation    Referral Reason:   Specialty Services Required    Requested Specialty:   Dermatology    Number of Visits Requested:   1    Return in about 3 months (around 10/05/2023) for chronic disease f/u.   The patient was advised to call back or seek an in-person evaluation if the symptoms worsen or if the condition fails to improve as anticipated.  I discussed the assessment and treatment plan with the patient. The patient was provided an opportunity to ask questions and all were answered. The patient agreed with the plan and demonstrated an  understanding of the instructions.  I, Rick Warnick, PA-C have reviewed all documentation for this visit. The documentation on 07/05/2023  for the exam, diagnosis, procedures, and orders are all accurate and complete.  Blane Bunting, [facility], MMS [facility] [phone] (phone) [phone] (fax)  [facility]

## 2023-07-06 LAB — CBC WITH DIFFERENTIAL/PLATELET
Basophils Absolute: 0 10*3/uL (ref 0.0–0.2)
Basos: 1 %
EOS (ABSOLUTE): 0.1 10*3/uL (ref 0.0–0.4)
Eos: 2 %
Hematocrit: 43.4 % (ref 34.0–46.6)
Hemoglobin: 14.1 g/dL (ref 11.1–15.9)
Immature Grans (Abs): 0 10*3/uL (ref 0.0–0.1)
Immature Granulocytes: 0 %
Lymphocytes Absolute: 1.5 10*3/uL (ref 0.7–3.1)
Lymphs: 36 %
MCH: 32.3 pg (ref 26.6–33.0)
MCHC: 32.5 g/dL (ref 31.5–35.7)
MCV: 100 fL — ABNORMAL HIGH (ref 79–97)
Monocytes Absolute: 0.3 10*3/uL (ref 0.1–0.9)
Monocytes: 8 %
Neutrophils Absolute: 2.3 10*3/uL (ref 1.4–7.0)
Neutrophils: 53 %
Platelets: 240 10*3/uL (ref 150–450)
RBC: 4.36 x10E6/uL (ref 3.77–5.28)
RDW: 12.7 % (ref 11.7–15.4)
WBC: 4.3 10*3/uL (ref 3.4–10.8)

## 2023-07-06 LAB — LIPID PANEL
Chol/HDL Ratio: 3.4 ratio (ref 0.0–4.4)
Cholesterol, Total: 184 mg/dL (ref 100–199)
HDL: 54 mg/dL (ref >39)
LDL Chol Calc (NIH): 112 mg/dL — ABNORMAL HIGH (ref 0–99)
Triglycerides: 102 mg/dL (ref 0–149)
VLDL Cholesterol Cal: 18 mg/dL (ref 5–40)

## 2023-07-06 LAB — COMPREHENSIVE METABOLIC PANEL WITH GFR
ALT: 17 IU/L (ref 0–32)
AST: 16 IU/L (ref 0–40)
Albumin: 4.6 g/dL (ref 3.9–4.9)
Alkaline Phosphatase: 77 IU/L (ref 44–121)
BUN/Creatinine Ratio: 13 (ref 9–23)
BUN: 11 mg/dL (ref 6–20)
Bilirubin Total: 0.4 mg/dL (ref 0.0–1.2)
CO2: 21 mmol/L (ref 20–29)
Calcium: 10.1 mg/dL (ref 8.7–10.2)
Chloride: 103 mmol/L (ref 96–106)
Creatinine, Ser: 0.88 mg/dL (ref 0.57–1.00)
Globulin, Total: 2.2 g/dL (ref 1.5–4.5)
Glucose: 78 mg/dL (ref 70–99)
Potassium: 4.3 mmol/L (ref 3.5–5.2)
Sodium: 140 mmol/L (ref 134–144)
Total Protein: 6.8 g/dL (ref 6.0–8.5)
eGFR: 89 mL/min/{1.73_m2} (ref >59)

## 2023-07-06 LAB — TSH: TSH: 1.26 u[IU]/mL (ref 0.450–4.500)

## 2023-07-06 LAB — HEMOGLOBIN A1C
Est. average glucose Bld gHb Est-mCnc: 97 mg/dL
Hgb A1c MFr Bld: 5 % (ref 4.8–5.6)

## 2023-07-07 ENCOUNTER — Encounter: Payer: Self-pay | Admitting: Obstetrics and Gynecology

## 2023-07-07 ENCOUNTER — Inpatient Hospital Stay: Attending: Oncology | Admitting: Obstetrics and Gynecology

## 2023-07-07 VITALS — BP 116/83 | HR 78 | Temp 96.3°F | Resp 20 | Wt 169.2 lb

## 2023-07-07 DIAGNOSIS — Z08 Encounter for follow-up examination after completed treatment for malignant neoplasm: Secondary | ICD-10-CM | POA: Insufficient documentation

## 2023-07-07 DIAGNOSIS — C539 Malignant neoplasm of cervix uteri, unspecified: Secondary | ICD-10-CM

## 2023-07-07 DIAGNOSIS — Z8541 Personal history of malignant neoplasm of cervix uteri: Secondary | ICD-10-CM | POA: Insufficient documentation

## 2023-07-07 DIAGNOSIS — N895 Stricture and atresia of vagina: Secondary | ICD-10-CM | POA: Insufficient documentation

## 2023-07-07 DIAGNOSIS — Z923 Personal history of irradiation: Secondary | ICD-10-CM | POA: Diagnosis not present

## 2023-07-07 NOTE — Progress Notes (Signed)
 Gynecologic Oncology Interval Visit   Referring Provider: Dr Alvia Awkward  Chief Concern: cervical cancer, surveillance visit Subjective:  Dawn Camacho is a 33 y.o. G0 female who was seen in consultation from Dr Alvia Awkward for cervical cancer.  Sexually active with mild spotting after intercourse.  Not using vaginal dilator. Had episode of cramping yesterday like a period.   PAP 1/25 unsatisfactory.  HR HPV + PAP 05/26/23 unsatisfactory.  HR HPV +  Gynecologic Oncology History Dawn Camacho was seen in consultation from Dr. Dr Alvia Awkward for cervical cancer.  05/08/21 seen by Midwife for post coital spotting x 1.5 months.  Cervical exam: Large, pink, friable and highly vascularized, non-tender, not smooth, irregular cervical growth extending from 11-5.   PAP ASCUS-HR/HPV+. Patient's last menstrual period was 04/29/2021.    Colposcopy confirmed irregular cervical growth extending from 11-5:00. 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   06/12/2021 MRI Pelvis IMPRESSION: 1. 4.2 x 3.7 x 2.9 cm cervical mass involving the anterior aspect of the cervix and also likely involving the anterior aspect of the lower uterine segment. This extends into the vagina mainly on the left side. No MR findings to suggest involvement of the bladder, urethra or rectum. 2. Borderline enlarged bilateral pelvic sidewall lymph nodes worrisome for metastatic disease.  06/19/2021 PET scan IMPRESSION: 1. Hypermetabolic activity of the cervix, compatible with primary malignancy. 2. Hypermetabolic subcentimeter bilateral external iliac lymph nodes, concerning for metastatic disease. 3. Mildly hypermetabolic left inguinal lymph node, concerning for additional site of metastatic disease. 4. Bilateral nonobstructing renal stones.  06/30/2021 Port placement  60Gy to cervix and nodes at [facility] under the care of Dr. Jacalyn Martin in 07/10/2021-08/29/2021. T&O at [facility] with Dr. Americo Baker.  7/10 - 09/15/21  Cervix, T&O/R Total Dose 2400 cGy (each fraction 600 cGy)  Post treatment PET 04/09/22 was negative.   12/17/2021 Dilator teaching to avoid vaginal stenosis, performed at [facility].   Married and used Nuva ring for contraception.  Has not had a strong desire for children, but not sure for certain.   Had 2 Gardesil vaccinations, but then last in the series was not done due to recall or some other problem.  06/24/22- HIV negative      11/19/2021 PET scan IMPRESSION: 1. Complete metabolic response to therapy of cervical primary and pelvic nodal metastasis. 2. Bilateral nephrolithiasis.  02/24/2022 Brain MRI performed by Neurology for history of headaches IMPRESSION: Borderline Chiari 1 with slight crowding of the craniocervical junction. Otherwise, normal brain MRI.  04/09/2022 PET  IMPRESSION: No evidence of hypermetabolic recurrent or metastatic disease.  06/24/22 for surveillance. Pap was obtained but was insufficient for testing. Additionally, she had post coital spotting. Granulation tissue on cervix was observed and thought to be etiology. She was treated with silver nitrate. HPV was positive. It was recollected on 07/22/22 and reported as EPCA, ASCUS-H, HR HPV Positive.   08/05/22 colposcopy and biopsy with Dr Randalyn Bushman.  Colposcopy of the upper vagina and cervix was performed after application os acetic acid. The findings revealed cervical erythema and most prominent at 5-6 o'clock. A slightly raised friable area on the left vagina around 5 o'clock by the cervix. WE lateral left wall. The transformation zone was/was not seen.  .  CERVIX, 6:00; BIOPSY:  - INFLAMED GRANULATION TISSUE AND SEPARATE FRAGMENTS OF NECROINFLAMMATORY MATERIAL.  - AN EPITHELIAL SURFACE IS NOT PRESENT.  - STROMAL CHANGES CONSISTENT WITH PRIOR RADIATION THERAPY.  - NEGATIVE FOR DYSPLASIA AND MALIGNANCY.  Recommended RTC for ECC and  possible additional biopsies.   09/23/2022  Endocervix, curettage - HIGH-GRADE SQUAMOUS  INTRAEPITHELIAL LESION (HSIL).  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 The specimen was processed as a cell block due to the limited amount of material on gross examination. Sections demonstrate an extremely scant specimen with rare atypical cells that are p16 positive. Deeper sections were examined. 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 Features of superimposed radiation therapy effect are noted.  MRI 10/06/22 FINDINGS: Urinary Tract:  Bladder is within normal limits.   Bowel:  Visualized bowel is grossly unremarkable.   Vascular/Lymphatic: No evidence of aneurysm.   No suspicious pelvic lymphadenopathy.   Reproductive: No convincing residual cervical mass. Mild hypoperfusion along the right lateral aspect of the lower uterine segment/cervix on delayed imaging (series 26/image 40), but without associated restricted diffusion or other findings suggestive tumor.   Bilateral ovaries are within normal limits.   Other:  Small volume pelvic ascites.   Musculoskeletal: No focal osseous lesions.   IMPRESSION: No MR findings to suggest cervical cancer recurrence. No evidence of metastatic disease in the pelvis.  PET scan 10/14/22 FINDINGS: Mediastinal blood pool activity: SUV max 2.8   Liver activity: SUV max NA   NECK: No hypermetabolic cervical lymphadenopathy.   Incidental CT findings: None.   CHEST: No hypermetabolic thoracic lymphadenopathy.   No suspicious pulmonary nodules.   Right chest port terminates at the cavoatrial junction.   Incidental CT findings: None.   ABDOMEN/PELVIS: No abnormal hypermetabolism in the liver, spleen, pancreas, or adrenal glands.   No hypermetabolic abdominopelvic lymphadenopathy. Small bilateral inguinal nodes, likely reactive.   No focal cervical hypermetabolism to suggest recurrence on PET.   Incidental CT  findings: Five nonobstructing right renal calculi measuring up to 5 mm. No hydronephrosis. Trace pelvic fluid/ascites.   SKELETON: No focal hypermetabolic activity to suggest skeletal metastasis.   Incidental CT findings: None.   IMPRESSION: No findings suspicious for recurrent or metastatic disease.  In ED 12/15/22 for right flank pain thought to be due to chronic kidney stones.  Stone has not passed, but feeling better, no pain.  CT scan renal protocol FINDINGS: Lower chest: No acute abnormality.   Hepatobiliary: No focal liver abnormality is seen. No gallstones, gallbladder wall thickening, or biliary dilatation.   Pancreas: Unremarkable. No pancreatic ductal dilatation or surrounding inflammatory changes.   Spleen: Normal in size without focal abnormality.   Adrenals/Urinary Tract: Adrenal glands are within normal limits. Left kidney shows punctate lower pole renal stone without obstructive change. Right kidney demonstrates moderate hydronephrosis secondary to a 4 mm stone in the proximal right ureter. Multiple nonobstructing right renal stones are noted as well measuring up to 4 mm. More distal right ureter is unremarkable. The bladder is decompressed.   Stomach/Bowel: Stomach is within normal limits. Appendix appears normal. No evidence of bowel wall thickening, distention, or inflammatory changes.   Vascular/Lymphatic: No significant vascular findings are present. No enlarged abdominal or pelvic lymph nodes.   Reproductive: Uterus and bilateral adnexa are unremarkable.   Other: No abdominal wall hernia or abnormality. No abdominopelvic ascites.   Musculoskeletal: No acute or significant osseous findings.   IMPRESSION: 4 mm proximal right ureteral stone with obstructive change.   Bilateral nonobstructing renal calculi as described.   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   Cervical cancer, FIGO stage III (HCC) 06/24/2021   Cervical cancer (HCC) 06/11/2021   Dyshydrosis 11/21/2015    Past Medical History: Past Medical History:  Diagnosis Date   Allergic rhinitis    Allergy    Anxiety 2016   Cervical cancer (HCC)     Past Surgical History: Past Surgical History:  Procedure Laterality Date   IR IMAGING GUIDED PORT INSERTION  06/30/2021   IR REMOVAL TUN ACCESS W/ PORT W/O FL MOD SED  04/06/2023   TONSILECTOMY, ADENOIDECTOMY, BILATERAL MYRINGOTOMY AND TUBES Bilateral    TONSILLECTOMY     WISDOM TOOTH EXTRACTION       Family History: Family History  Problem Relation Age of Onset   Depression Mother    Hearing loss Mother    Kidney disease Mother    Miscarriages / [place] Mother    Obesity Mother    Heart disease Father    ADD / ADHD Father    Breast cancer Maternal Grandmother    Lung cancer Paternal Grandmother    Cancer Paternal Grandmother    COPD Paternal Grandmother    Heart attack Paternal Grandfather    ADD / ADHD Brother     Social History: Social History   Socioeconomic History   Marital status: Married    Spouse name: Not on file   Number of children: Not on file   Years of education: Not on file   Highest education level: Associate degree: academic program  Occupational History   Not on file  Tobacco Use   Smoking status: Never   Smokeless tobacco: Never  Vaping Use   Vaping status: Never Used  Substance and Sexual Activity   Alcohol use: Yes    Alcohol/week: 1.0 standard drink of alcohol    Types: 1 Glasses of wine per week    Comment:  occasional--every couple of weeks.   Drug use: No   Sexual activity: Yes    Birth control/protection: None  Other Topics Concern   Not on file  Social History Narrative   Not on file   Social Drivers of Health   Financial Resource Strain: Low Risk  (10/19/2022)   Overall Financial Resource Strain (CARDIA)    Difficulty of Paying Living Expenses: Not hard at all  Food Insecurity: No Food Insecurity (10/19/2022)   Hunger Vital Sign    Worried About Running Out of Food in the Last Year: Never true    Ran Out of Food in the Last Year: Never true  Transportation Needs: No Transportation Needs (10/19/2022)   PRAPARE - Administrator, Civil Service (Medical): No    Lack of Transportation (Non-Medical): No  Physical Activity: Insufficiently Active (10/19/2022)   Exercise Vital Sign    Days of Exercise per Week: 2 days    Minutes of Exercise per Session: 30 min  Stress: No Stress Concern Present (10/19/2022)   [employer] of Occupational Health - Occupational Stress Questionnaire    Feeling of Stress : Only a little  Social Connections: Moderately Isolated (10/19/2022)   Social Connection and Isolation Panel [NHANES]    Frequency of Communication with Friends and Family: More than three times a  week    Frequency of Social Gatherings with Friends and Family: Three times a week    Attends Religious Services: Never    Active Member of Clubs or Organizations: No    Attends Engineer, structural: Not on file    Marital Status: Married  Catering manager Violence: Not on file    Allergies: No Known Allergies  Current Medications: Current Outpatient Medications  Medication Sig Dispense Refill   aspirin-acetaminophen -caffeine (EXCEDRIN MIGRAINE) 250-250-65 MG tablet Take by mouth every 6 (six) hours as needed for headache.     estradiol  (ESTRACE  VAGINAL) 0.1 MG/GM vaginal cream Place 1 Applicatorful vaginally at bedtime. For 2 weeks then 3 nights a week. 42.5 g 12    ibuprofen  (ADVIL ) 800 MG tablet Take 1 tablet (800 mg total) by mouth every 8 (eight) hours as needed. 30 tablet 0   lidocaine  (XYLOCAINE ) 5 % ointment Apply 1 Application topically as needed (Bring to appointment for biopsy.). 35.44 g 0   lidocaine -prilocaine  (EMLA ) cream APPLY TOPICALLY TO THE AFFECTED AREA 1 TIME 30 g 3   methylPREDNISolone (MEDROL DOSEPAK) 4 MG TBPK tablet Take by mouth as directed.     Norethindrone -Ethinyl Estradiol -Fe Biphas (LO LOESTRIN FE) 1 MG-10 MCG / 10 MCG tablet Take 1 tablet by mouth daily. Take pills continuously for 28 day cycle for hormonal replacement 28 tablet 11   ondansetron  (ZOFRAN -ODT) 4 MG disintegrating tablet Take 1 tablet (4 mg total) by mouth every 6 (six) hours as needed for nausea or vomiting. 20 tablet 0   oxyCODONE -acetaminophen  (PERCOCET) 5-325 MG tablet Take 2 tablets by mouth every 8 (eight) hours as needed for severe pain (pain score 7-10). 16 tablet 0   propranolol (INDERAL) 20 MG tablet Start taking propranolol for migraines. Take 10mg  twice daily for one week, then increase to 20mg  twice daily for migraines. Monitor pulse and blood pressure while taking this. Pulse should not be lower than 60. Call if any symptoms of low blood pressure including fatigue, weakness, dizziness, etc. Do not take this medication if you have any history of COPD, asthma, heart block, or bradycardia.     rizatriptan (MAXALT) 10 MG tablet Take 10 mg by mouth as needed for migraine. May repeat in 2 hours if needed     Semaglutide -Weight Management 1.7 MG/0.75ML SOAJ Inject 1.7 mg into the skin once a week. 3 mL 1   SUMAtriptan (IMITREX) 100 MG tablet Take by mouth.     tamsulosin  (FLOMAX ) 0.4 MG CAPS capsule Take 1 capsule (0.4 mg total) by mouth daily. 30 capsule 0   venlafaxine (EFFEXOR) 37.5 MG tablet Start taking Effexor 37.5 mg daily for one week, then increase to 37.5 mg twice per day for headaches.     No current facility-administered medications for this visit.    Facility-Administered Medications Ordered in Other Visits  Medication Dose Route Frequency Provider Last Rate Last Admin   sodium chloride  flush (NS) 0.9 % injection 10 mL  10 mL Intravenous PRN Rao, Archana C, MD   10 mL at 07/07/21 0841   Review of Systems General:  no complaints Skin: no complaints Eyes: no complaints HEENT: no complaints Breasts: no complaints Pulmonary: no complaints Cardiac: no complaints Gastrointestinal: no complaints Genitourinary/Sexual: no complaints Ob/Gyn: no complaints Musculoskeletal: no complaints Hematology: no complaints Neurologic/Psych: no complaints   Objective:  Physical Examination:  Wt 169 lb 3.2 oz (76.7 kg)   BMI 27.31 kg/m    Vitals:   07/07/23 1526  BP: 116/83  Pulse:  78  Resp: 20  Temp: (!) 96.3 F (35.7 C)  SpO2: 100%    ECOG Performance Status: 0 - Asymptomatic  GENERAL: Patient is a well appearing female in no acute distress HEENT: Atraumatic normocephalic.  LUNGS:  Nornal respiratory effort.  HEART: normal no mummers ABDOMEN:  Soft, nontender. Nondistended.  No masses or ascites EXTREMITIES:  No peripheral edema.   NEURO:  Nonfocal. Well oriented.  Appropriate affect.  Pelvic: Chaperoned with CMA EGBUS: no lesions Cervix:  flush, no lesions Vagina: no lesions, but upper vagina again agglutinated. Broke up adhesions and upper vagina and cervix looks atrophic.  Was possibly able to advance PAP cytobrush into canal.   Uterus: not grossly enlarged; seems to be normal size, nontender, mobile BME: no palpable masses, parametria is smooth and there is no nodularity Rectovaginal: confirms normal cervix without nodularity  Lab Review No labs on site today  Assessment:  Dawn Camacho is a 33 y.o. G0 female diagnosed with IIIC1 invasive squamous cell cervical cancer 3/23 with 4.2 cm cervical tumor and positive external iliac nodes on PET scan s/p chemoradiation with negative posttreatment PET.    Vaginal  bleeding 2/24 and PET/CT negative.  Abnormal Pap with ASC-H and HR HPV positive 5/24.  Colposcopic vaginal biopsy 6/24 showed just inflammation.  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  Pelvic MRI and PET scan 8/24 negative for recurrence of cancer. Plan for continued close surveillance with PAPs and imaging.  PAPs x 2 earlier this year insufficient. HPV+.      Today upper vagina again agglutinated. Broke up filmy adhesions in upper vagina again and cervix does not looking concerning.  Not using dilator, but needs to again.  Had used estrogen cream too in the past.  She is sexually active.     Kidney stone in 10/24.   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   Peripheral neuropathy has resolved.   Medical co-morbidities complicating care: anxiety.  Plan:   Problem List Items Addressed This Visit       Genitourinary   Cervical cancer [facility])   Other Visit Diagnoses       Encounter for follow-up surveillance of cervical cancer    -  Primary      Discussed that with PET and MRI negative for cancer in the cervix 8/24 recommend close surveillance with exams q2 months.  LEEP/[facility] would be technically challenging and the cellular changes on cytology and biopsy are likely reactive due to radiation.  Hysterectomy would be an option if there is more concern for recurrence in the cervix, but risk of complications including fistula are significant post full radiation for cervical cancer.   PAP 1/25 and 4/25 unsatisfactory.  HR HPV+ PAP repeated today after breaking down upper vaginal adhesions and may have entered canal with cytobrush.    Will repeat imaging in view of cramping with PET scan.    Continue LOESTRIN. Bone Health-calcium 1200 mg and vitamin D 1000 iu daily for prevention of bone loss. Encouraged weight bearing exercise life long.  The patient's diagnosis, an outline of the further diagnostic and laboratory studies which will  be required, the recommendation, and alternatives were discussed.  All questions were answered to the patient's satisfaction.  Nelda Balsam, NP  I personally interviewed and examined the patient. Agreed with the above/below plan of care. I have directly contributed to assessment and plan of care of this patient and educated and discussed with  patient and family.  Hermine Loots, MD

## 2023-07-12 ENCOUNTER — Ambulatory Visit: Payer: Self-pay | Admitting: Physician Assistant

## 2023-07-13 DIAGNOSIS — L578 Other skin changes due to chronic exposure to nonionizing radiation: Secondary | ICD-10-CM | POA: Diagnosis not present

## 2023-07-13 DIAGNOSIS — L249 Irritant contact dermatitis, unspecified cause: Secondary | ICD-10-CM | POA: Diagnosis not present

## 2023-07-13 DIAGNOSIS — D225 Melanocytic nevi of trunk: Secondary | ICD-10-CM | POA: Diagnosis not present

## 2023-07-13 LAB — PAP LB (LIQUID-BASED)

## 2023-07-14 ENCOUNTER — Ambulatory Visit
Admission: RE | Admit: 2023-07-14 | Discharge: 2023-07-14 | Disposition: A | Discharge status: Home / Self Care | Source: Ambulatory Visit | Attending: Nurse Practitioner

## 2023-07-14 DIAGNOSIS — Z08 Encounter for follow-up examination after completed treatment for malignant neoplasm: Secondary | ICD-10-CM | POA: Insufficient documentation

## 2023-07-14 DIAGNOSIS — N200 Calculus of kidney: Secondary | ICD-10-CM | POA: Insufficient documentation

## 2023-07-14 DIAGNOSIS — C539 Malignant neoplasm of cervix uteri, unspecified: Secondary | ICD-10-CM

## 2023-07-14 DIAGNOSIS — Z8541 Personal history of malignant neoplasm of cervix uteri: Secondary | ICD-10-CM | POA: Insufficient documentation

## 2023-07-14 LAB — GLUCOSE, CAPILLARY: Glucose-Capillary: 70 mg/dL (ref 70–99)

## 2023-07-14 MED ORDER — FLUDEOXYGLUCOSE F - 18 (FDG) INJECTION
8.8000 | Freq: Once | INTRAVENOUS | Status: AC | PRN
  Administered 2023-07-14: 9.09 via INTRAVENOUS

## 2023-07-28 ENCOUNTER — Telehealth: Payer: Self-pay | Admitting: Nurse Practitioner

## 2023-07-28 NOTE — Telephone Encounter (Signed)
 Pet reviewed by Dr Marella Shams who feels results are reassuring. No evidence of malignancy. Spoke to patient by phone and reviewed. F/u as planned or sooner if concerning symptoms.

## 2023-08-02 ENCOUNTER — Ambulatory Visit: Admitting: Physician Assistant

## 2023-08-02 DIAGNOSIS — G43009 Migraine without aura, not intractable, without status migrainosus: Secondary | ICD-10-CM | POA: Diagnosis not present

## 2023-08-02 DIAGNOSIS — F40298 Other specified phobia: Secondary | ICD-10-CM | POA: Diagnosis not present

## 2023-08-02 DIAGNOSIS — Z8541 Personal history of malignant neoplasm of cervix uteri: Secondary | ICD-10-CM | POA: Diagnosis not present

## 2023-08-02 DIAGNOSIS — R200 Anesthesia of skin: Secondary | ICD-10-CM | POA: Diagnosis not present

## 2023-08-02 DIAGNOSIS — Z1331 Encounter for screening for depression: Secondary | ICD-10-CM | POA: Diagnosis not present

## 2023-08-05 ENCOUNTER — Other Ambulatory Visit: Payer: Self-pay | Admitting: Physician Assistant

## 2023-08-05 DIAGNOSIS — E66811 Obesity, class 1: Secondary | ICD-10-CM

## 2023-08-31 ENCOUNTER — Encounter: Payer: Self-pay | Admitting: Physician Assistant

## 2023-09-08 ENCOUNTER — Other Ambulatory Visit: Payer: Self-pay | Admitting: Physician Assistant

## 2023-09-08 DIAGNOSIS — E66811 Obesity, class 1: Secondary | ICD-10-CM

## 2023-09-27 ENCOUNTER — Encounter: Payer: Self-pay | Admitting: Physician Assistant

## 2023-09-27 ENCOUNTER — Other Ambulatory Visit (HOSPITAL_COMMUNITY): Payer: Self-pay

## 2023-09-27 ENCOUNTER — Telehealth: Payer: Self-pay

## 2023-09-27 DIAGNOSIS — E66811 Obesity, class 1: Secondary | ICD-10-CM

## 2023-09-27 NOTE — Telephone Encounter (Signed)
 Unfortunately, per test claim, medication is not covered due to plan/benefit exclusion.

## 2023-09-27 NOTE — Telephone Encounter (Signed)
 Pharmacy Patient Advocate Encounter   Received notification from Patient Advice Request messages that prior authorization for Wegovy  1.7 mg/0.60ml Pen is required/requested.   Insurance verification completed.   The patient is insured through [facility] .   Per test claim: Medication is not covered due to plan/benefit exclusion

## 2023-10-03 NOTE — Progress Notes (Signed)
 Established patient visit  Patient: Dawn Camacho   DOB: 14-Apr-1990   33 y.o. Female  MRN: [id number] Visit Date: 10/05/2023  Today's healthcare provider: Jolynn Spencer, PA-C   Chief Complaint  Patient presents with   Medical Management of Chronic Issues    Patient is present for 3 month follow-up. She was last seen on 07/05/23. She reports taking medications as prescribed and tolerating well with no concerns today   Care Management   Subjective     HPI     Medical Management of Chronic Issues    Additional comments: Patient is present for 3 month follow-up. She was last seen on 07/05/23. She reports taking medications as prescribed and tolerating well with no concerns today      Last edited by Lilian Fitzpatrick, CMA on 10/05/2023  8:45 AM.       Discussed the use of AI scribe software for clinical note transcription with the patient, who gave verbal consent to proceed.  History of Present Illness Dawn Camacho is a 33 year old female who presents with concerns about insurance coverage for weight loss medication.  She previously used Wegovy , which was covered by her former insurance, but her current insurance does not cover it. Her current insurance covers Ozempic , but it is only prescribed for diabetic patients, and she is not diabetic. She has been on weight loss medication since September of last year and experienced significant weight loss. Her last dose was in early July at 1.7 mg. She is concerned about the lack of coverage and the potential need to discontinue the medication.  Her depression and anxiety are well-managed, and her blood pressure is stable. Lipid levels were slightly elevated at the last check.  She is attempting to maintain her weight through diet and exercise. Her exercise routine was disrupted due to a job change, with travel between [place], [place], and [place] affecting her consistency. She plans to return to a more consistent gym routine as  her schedule stabilizes.  Lab review showed: elevated cholesterol      07/05/2023    9:49 AM 11/27/2022    1:16 PM 10/20/2022    3:58 PM  Depression screen PHQ 2/9  Decreased Interest 1 0 0  Down, Depressed, Hopeless 1 0 0  PHQ - 2 Score 2 0 0  Altered sleeping 0 1   Tired, decreased energy 1 1   Change in appetite 0 0   Feeling bad or failure about yourself  0 1   Trouble concentrating 0 0   Moving slowly or fidgety/restless 0 0   Suicidal thoughts 0 0   PHQ-9 Score 3 3   Difficult doing work/chores Not difficult at all        07/05/2023    9:50 AM 11/27/2022    1:15 PM 10/20/2022    3:58 PM  GAD 7 : Generalized Anxiety Score  Nervous, Anxious, on Edge 2 2 1   Control/stop worrying 1 0 1  Worry too much - different things 0 1 1  Trouble relaxing 0 0 0  Restless 0 0 0  Easily annoyed or irritable 2 1 1   Afraid - awful might happen 0 0 1  Total GAD 7 Score 5 4 5   Anxiety Difficulty Somewhat difficult  Not difficult at all    Medications: Outpatient Medications Prior to Visit  Medication Sig   aspirin-acetaminophen -caffeine (EXCEDRIN MIGRAINE) 250-250-65 MG tablet Take by mouth every 6 (six) hours as needed for headache.   estradiol  (  ESTRACE  VAGINAL) 0.1 MG/GM vaginal cream Place 1 Applicatorful vaginally at bedtime. For 2 weeks then 3 nights a week.   lidocaine  (XYLOCAINE ) 5 % ointment Apply 1 Application topically as needed (Bring to appointment for biopsy.).   lidocaine -prilocaine  (EMLA ) cream APPLY TOPICALLY TO THE AFFECTED AREA 1 TIME   methylPREDNISolone (MEDROL DOSEPAK) 4 MG TBPK tablet Take by mouth as directed.   Norethindrone -Ethinyl Estradiol -Fe Biphas (LO LOESTRIN FE) 1 MG-10 MCG / 10 MCG tablet Take 1 tablet by mouth daily. Take pills continuously for 28 day cycle for hormonal replacement   propranolol ER (INDERAL LA) 60 MG 24 hr capsule Take 60 mg by mouth daily.   SUMAtriptan (IMITREX) 100 MG tablet Take by mouth.   WEGOVY  1.7 MG/0.75ML SOAJ INJECT 1.7 MG  UNDER THE SKIN ONCE WEEKLY   ibuprofen  (ADVIL ) 800 MG tablet Take 1 tablet (800 mg total) by mouth every 8 (eight) hours as needed.   ondansetron  (ZOFRAN -ODT) 4 MG disintegrating tablet Take 1 tablet (4 mg total) by mouth every 6 (six) hours as needed for nausea or vomiting.   oxyCODONE -acetaminophen  (PERCOCET) 5-325 MG tablet Take 2 tablets by mouth every 8 (eight) hours as needed for severe pain (pain score 7-10).   propranolol (INDERAL) 20 MG tablet Start taking propranolol for migraines. Take 10mg  twice daily for one week, then increase to 20mg  twice daily for migraines. Monitor pulse and blood pressure while taking this. Pulse should not be lower than 60. Call if any symptoms of low blood pressure including fatigue, weakness, dizziness, etc. Do not take this medication if you have any history of COPD, asthma, heart block, or bradycardia. (Patient not taking: Reported on 10/05/2023)   rizatriptan (MAXALT) 10 MG tablet Take 10 mg by mouth as needed for migraine. May repeat in 2 hours if needed   tamsulosin  (FLOMAX ) 0.4 MG CAPS capsule Take 1 capsule (0.4 mg total) by mouth daily.   venlafaxine (EFFEXOR) 37.5 MG tablet Start taking Effexor 37.5 mg daily for one week, then increase to 37.5 mg twice per day for headaches.   Facility-Administered Medications Prior to Visit  Medication Dose Route Frequency Provider   sodium chloride  flush (NS) 0.9 % injection 10 mL  10 mL Intravenous PRN Rao, Archana C, MD    Review of Systems  All other systems reviewed and are negative.  All negative Except see HPI       Objective    BP 105/60 (BP Location: Left Arm, Patient Position: Sitting, Cuff Size: Normal)   Pulse (!) 51   Ht 5' 6 (1.676 m)   Wt 167 lb 8 oz (76 kg)   SpO2 100%   BMI 27.04 kg/m     Physical Exam Vitals reviewed.  Constitutional:      General: She is not in acute distress.    Appearance: Normal appearance. She is well-developed. She is not diaphoretic.  HENT:     Head:  Normocephalic and atraumatic.  Eyes:     General: No scleral icterus.    Conjunctiva/sclera: Conjunctivae normal.  Neck:     Thyroid : No thyromegaly.  Cardiovascular:     Rate and Rhythm: Normal rate and regular rhythm.     Pulses: Normal pulses.     Heart sounds: Normal heart sounds. No murmur heard. Pulmonary:     Effort: Pulmonary effort is normal. No respiratory distress.     Breath sounds: Normal breath sounds. No wheezing, rhonchi or rales.  Musculoskeletal:     Cervical back: Neck supple.  Right lower leg: No edema.     Left lower leg: No edema.  Lymphadenopathy:     Cervical: No cervical adenopathy.  Skin:    General: Skin is warm and dry.     Findings: No rash.  Neurological:     Mental Status: She is alert and oriented to person, place, and time. Mental status is at baseline.  Psychiatric:        Mood and Affect: Mood normal.        Behavior: Behavior normal.      No results found for any visits on 10/05/23.      Assessment & Plan Obesity/Overweight improved She is overweight, not obese. Her insurance does not cover Wegovy , which she has used for weight loss with benefits.  - Encouraged dietary changes and regular exercise. - Monitor insurance coverage for weight loss medications. - Discussed potential side effects of weight loss medications.  Elevated cholesterol Lipid levels slightly elevated, not significant for her age. Will monitor   No orders of the defined types were placed in this encounter.   No follow-ups on file.   The patient was advised to call back or seek an in-person evaluation if the symptoms worsen or if the condition fails to improve as anticipated.  I discussed the assessment and treatment plan with the patient. The patient was provided an opportunity to ask questions and all were answered. The patient agreed with the plan and demonstrated an understanding of the instructions.  I, Lenvil Swaim, PA-C have reviewed all  documentation for this visit. The documentation on 10/05/2023  for the exam, diagnosis, procedures, and orders are all accurate and complete.  Jolynn Spencer, [facility], MMS [facility] [phone] (phone) [phone] (fax)  [facility]

## 2023-10-05 ENCOUNTER — Encounter: Payer: Self-pay | Admitting: Physician Assistant

## 2023-10-05 ENCOUNTER — Ambulatory Visit: Admitting: Physician Assistant

## 2023-10-05 VITALS — BP 105/60 | HR 51 | Ht 66.0 in | Wt 167.5 lb

## 2023-10-05 DIAGNOSIS — E66811 Obesity, class 1: Secondary | ICD-10-CM

## 2023-10-05 DIAGNOSIS — E7800 Pure hypercholesterolemia, unspecified: Secondary | ICD-10-CM | POA: Diagnosis not present

## 2023-10-05 DIAGNOSIS — K5909 Other constipation: Secondary | ICD-10-CM

## 2023-10-05 DIAGNOSIS — D229 Melanocytic nevi, unspecified: Secondary | ICD-10-CM

## 2023-10-07 ENCOUNTER — Telehealth: Payer: Self-pay

## 2023-10-07 NOTE — Progress Notes (Unsigned)
 Care Guide Pharmacy Note  10/07/2023 Name: Dawn Camacho MRN: [id number] DOB: 10/06/1990  Referred By: Ostwalt, Janna, PA-C Reason for referral: Complex Care Management and Call Attempt #1 (Unsuccessful initial outreach to schedule with PHARM D- Allyson)   Dawn Camacho is a 33 y.o. year old female who is a primary care patient of Ostwalt, Janna, PA-C.  Dawn Camacho was referred to the pharmacist for assistance related to: Medication assistance  An unsuccessful telephone outreach was attempted today to contact the patient who was referred to the pharmacy team for assistance with medication assistance. Additional attempts will be made to contact the patient.  Dawn Camacho, [facility] Guide  Direct Dial: [phone]  Fax [phone]

## 2023-10-11 NOTE — Progress Notes (Signed)
 Care Guide Pharmacy Note  10/11/2023 Name: Dawn Camacho MRN: [id number] DOB: [birth year]  Referred By: Ostwalt, Janna, PA-C Reason for referral: Complex Care Management, Call Attempt #1 (Unsuccessful initial outreach to schedule with PHARM D- Allyson), and Call Attempt #2 (Successful initial outreach scheduled with PHARM- D Allyson)   Dawn Camacho is a 33 y.o. year old female who is a primary care patient of Ostwalt, Janna, PA-C.  Dawn Camacho was referred to the pharmacist for assistance related to: DMII  Successful contact was made with the patient to discuss pharmacy services including being ready for the pharmacist to call at least 5 minutes before the scheduled appointment time and to have medication bottles and any blood pressure readings ready for review. The patient agreed to meet with the pharmacist via telephone visit on (date/time). 10/20/23 @ 11 AM  Dawn Camacho Dawn Camacho, [facility] Guide  Direct Dial: [phone]  Fax [phone]

## 2023-10-19 NOTE — Progress Notes (Unsigned)
 10/20/2023 Name: Dawn Camacho MRN: [id number] DOB: [birth year]  Subjective  Chief Complaint  Patient presents with   Medication Management    Weight Management   Reason for visit: ?  Dawn Camacho is a 33 y.o. female with a history of obesity/overweight who presents today for a pharmacotherapy visit.?  Pertinent PMH also includes migraines, cervical cancer, and constipation.   Care Team: Primary Care Provider: Ostwalt, Janna, PA-C   At last visit with PCP on 10/05/23, patient requested assistance with weight management. She was previously on Wegovy  1.7 mg weekly, however, her new insurance no longer covers this medication.  Medication Access/Adherence: Prescription drug coverage: [employer]  Medications tried in the past:? Wegovy   Reported Diet: Patient typically eats 2 meals per day.  Breakfast: everything bagel with cream cheese, half breakfast [place] egg sandwich  Lunch: poke bowl, rice bowl, salad Dinner: salad, spaghetti, salmon and veggies Snacks: fruit, veggies with hummus, chips Beverages: water, Powerade, coffee   Reported Exercise: exercise classes a couple times a week   Relevant Medical History: Glaucoma: No Palpitations: No Chest Pain: No Headaches: Yes  Nephrolithiasis: Yes Seizures: No H/o pancreatitis: No Personal or family history of medullary cancer of thyroid :No   Weight Management Progress: Goal weight: 140-150 lbs Initial weight: 205 lbs (Wegovy  started 10/2022)  Cardiovascular Risk Reduction History of clinical ASCVD? no The ASCVD Risk score (Arnett DK, et al., 2019) failed to calculate for the following reasons:   The 2019 ASCVD risk score is only valid for ages 53 to 18 History of heart failure? no Current BMI: 27.05 kg/m2 (Ht 5'6 in, Wt 76 kg)   _______________________________________________  Objective     Physical Examination:  Vitals:  There were no vitals filed for this visit. There is no height or weight on file to calculate  BMI. Wt Readings from Last 6 Encounters:  10/05/23 167 lb 8 oz (76 kg)  07/07/23 169 lb 3.2 oz (76.7 kg)  07/05/23 169 lb (76.7 kg)  05/26/23 171 lb 11.2 oz (77.9 kg)  04/02/23 185 lb (83.9 kg)  03/24/23 183 lb 6.4 oz (83.2 kg)   Wt Readings from Last 3 Encounters:  10/05/23 167 lb 8 oz (76 kg)  07/07/23 169 lb 3.2 oz (76.7 kg)  07/05/23 169 lb (76.7 kg)     Labs:?  Lab Results  Component Value Date   NA 140 07/05/2023   K 4.3 07/05/2023   CL 103 07/05/2023   CO2 21 07/05/2023   BUN 11 07/05/2023   CREATININE 0.88 07/05/2023   CALCIUM 10.1 07/05/2023   ALBUMIN 4.6 07/05/2023   Lab Results  Component Value Date   ALKPHOS 77 07/05/2023   BILITOT 0.4 07/05/2023   PROT 6.8 07/05/2023   ALBUMIN 4.6 07/05/2023   ALT 17 07/05/2023   AST 16 07/05/2023   No components found for: A1C Lab Results  Component Value Date   CHOL 184 07/05/2023   CHOL 179 10/20/2022   Lab Results  Component Value Date   HDL 54 07/05/2023   HDL 45 10/20/2022   No components found for: LDL No results found for: VLDL No components found for: [facility] Lab Results  Component Value Date   TRIG 102 07/05/2023   TRIG 122 10/20/2022   Lab Results  Component Value Date   TSH 1.260 07/05/2023    Assessment and Plan:   # Weight Management: Discussed different options for pharmacotherapy. Her insurance does not cover GLP1 therapy for weight loss. Discussed options to obtain  medications through [employer] and [employer], however, both are cost-prohibitive ($499 a month). Patient would like to continue lifestyle modifications at this time. Congratulated patient on her success since first initiation of GLP1 therapy September 2024. Offered for patient to follow up with clinical pharmacist with further lifestyle recommendations. Patient declined at this time.  Follow Up Follow up with clinical pharmacist PRN  Future Appointments  Date Time Provider Department Center  10/20/2023  3:00 PM  [facility] None  11/29/2023  1:00 PM Camacho, Janna, PA-C [facility] Dawn Camacho Dawn Camacho, PharmD Clinical Pharmacist [facility] [phone]

## 2023-10-20 ENCOUNTER — Inpatient Hospital Stay: Attending: Oncology | Admitting: Obstetrics and Gynecology

## 2023-10-20 ENCOUNTER — Other Ambulatory Visit (INDEPENDENT_AMBULATORY_CARE_PROVIDER_SITE_OTHER)

## 2023-10-20 VITALS — BP 106/64 | HR 59 | Temp 97.6°F | Resp 20 | Wt 173.7 lb

## 2023-10-20 DIAGNOSIS — Z79890 Hormone replacement therapy (postmenopausal): Secondary | ICD-10-CM | POA: Diagnosis not present

## 2023-10-20 DIAGNOSIS — Z79899 Other long term (current) drug therapy: Secondary | ICD-10-CM | POA: Insufficient documentation

## 2023-10-20 DIAGNOSIS — R87820 Cervical low risk human papillomavirus (HPV) DNA test positive: Secondary | ICD-10-CM | POA: Diagnosis not present

## 2023-10-20 DIAGNOSIS — Z801 Family history of malignant neoplasm of trachea, bronchus and lung: Secondary | ICD-10-CM | POA: Diagnosis not present

## 2023-10-20 DIAGNOSIS — Z8249 Family history of ischemic heart disease and other diseases of the circulatory system: Secondary | ICD-10-CM | POA: Insufficient documentation

## 2023-10-20 DIAGNOSIS — E28319 Asymptomatic premature menopause: Secondary | ICD-10-CM | POA: Insufficient documentation

## 2023-10-20 DIAGNOSIS — Z803 Family history of malignant neoplasm of breast: Secondary | ICD-10-CM | POA: Diagnosis not present

## 2023-10-20 DIAGNOSIS — N202 Calculus of kidney with calculus of ureter: Secondary | ICD-10-CM | POA: Insufficient documentation

## 2023-10-20 DIAGNOSIS — C775 Secondary and unspecified malignant neoplasm of intrapelvic lymph nodes: Secondary | ICD-10-CM | POA: Diagnosis not present

## 2023-10-20 DIAGNOSIS — Z8349 Family history of other endocrine, nutritional and metabolic diseases: Secondary | ICD-10-CM | POA: Diagnosis not present

## 2023-10-20 DIAGNOSIS — Z825 Family history of asthma and other chronic lower respiratory diseases: Secondary | ICD-10-CM | POA: Diagnosis not present

## 2023-10-20 DIAGNOSIS — E7800 Pure hypercholesterolemia, unspecified: Secondary | ICD-10-CM | POA: Diagnosis not present

## 2023-10-20 DIAGNOSIS — Z818 Family history of other mental and behavioral disorders: Secondary | ICD-10-CM | POA: Diagnosis not present

## 2023-10-20 DIAGNOSIS — Z08 Encounter for follow-up examination after completed treatment for malignant neoplasm: Secondary | ICD-10-CM | POA: Diagnosis not present

## 2023-10-20 DIAGNOSIS — Z9089 Acquired absence of other organs: Secondary | ICD-10-CM | POA: Insufficient documentation

## 2023-10-20 DIAGNOSIS — Z9221 Personal history of antineoplastic chemotherapy: Secondary | ICD-10-CM | POA: Insufficient documentation

## 2023-10-20 DIAGNOSIS — G935 Compression of brain: Secondary | ICD-10-CM | POA: Insufficient documentation

## 2023-10-20 DIAGNOSIS — Z923 Personal history of irradiation: Secondary | ICD-10-CM | POA: Insufficient documentation

## 2023-10-20 DIAGNOSIS — Z8541 Personal history of malignant neoplasm of cervix uteri: Secondary | ICD-10-CM | POA: Diagnosis not present

## 2023-10-20 DIAGNOSIS — E66811 Obesity, class 1: Secondary | ICD-10-CM

## 2023-10-20 DIAGNOSIS — R232 Flushing: Secondary | ICD-10-CM | POA: Diagnosis not present

## 2023-10-20 DIAGNOSIS — R109 Unspecified abdominal pain: Secondary | ICD-10-CM | POA: Diagnosis not present

## 2023-10-20 DIAGNOSIS — C539 Malignant neoplasm of cervix uteri, unspecified: Secondary | ICD-10-CM | POA: Diagnosis not present

## 2023-10-20 DIAGNOSIS — Z87442 Personal history of urinary calculi: Secondary | ICD-10-CM | POA: Diagnosis not present

## 2023-10-20 MED ORDER — ESTRADIOL 0.1 MG/GM VA CREA: 1.0000 | TOPICAL_CREAM | Freq: Every day | VAGINAL | 12 refills | Status: DC

## 2023-10-20 MED ORDER — NORETHIN ACE-ETH ESTRAD-FE 1-20 MG-MCG PO TABS: 1.0000 | ORAL_TABLET | Freq: Every day | ORAL | 11 refills | Status: DC

## 2023-10-20 NOTE — Progress Notes (Signed)
 Gynecologic Oncology Interval Visit   Referring Provider: Dr Verdon  Chief Concern: cervical cancer, surveillance visit Subjective:  Dawn Camacho is a 33 y.o. G0 female who was seen in consultation from Dr Verdon for cervical cancer.  Sexually active with mild spotting after intercourse.  Using small vaginal dilator. Having some hot flashes with current HRT Low LOestrin.  PAP 1/25 unsatisfactory.  HR HPV + PAP 05/26/23 unsatisfactory.  HR HPV + PAP 5/25 normal  Gynecologic Oncology History Dawn Camacho was seen in consultation from Dr. Dr Verdon for cervical cancer.  05/08/21 seen by Midwife for post coital spotting x 1.5 months.  Cervical exam: Large, pink, friable and highly vascularized, non-tender, not smooth, irregular cervical growth extending from 11-5.   PAP ASCUS-HR/HPV+. Patient's last menstrual period was 04/29/2021.    Colposcopy confirmed irregular cervical growth extending from 11-5:00. 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   06/12/2021 MRI Pelvis IMPRESSION: 1. 4.2 x 3.7 x 2.9 cm cervical mass involving the anterior aspect of the cervix and also likely involving the anterior aspect of the lower uterine segment. This extends into the vagina mainly on the left side. No MR findings to suggest involvement of the bladder, urethra or rectum. 2. Borderline enlarged bilateral pelvic sidewall lymph nodes worrisome for metastatic disease.  06/19/2021 PET scan IMPRESSION: 1. Hypermetabolic activity of the cervix, compatible with primary malignancy. 2. Hypermetabolic subcentimeter bilateral external iliac lymph nodes, concerning for metastatic disease. 3. Mildly hypermetabolic left inguinal lymph node, concerning for additional site of metastatic disease. 4. Bilateral nonobstructing renal stones.  06/30/2021 Port placement  60Gy to cervix and nodes at [facility] under the care of Dr. Lenn in 07/10/2021-08/29/2021. T&O at [facility] with Dr.  Lurline.  7/10 - 09/15/21 Cervix, T&O/R Total Dose 2400 cGy (each fraction 600 cGy)  Post treatment PET 04/09/22 was negative.   12/17/2021 Dilator teaching to avoid vaginal stenosis, performed at [facility].   Married and used Nuva ring for contraception.  Has not had a strong desire for children, but not sure for certain.   Had 2 Gardesil vaccinations, but then last in the series was not done due to recall or some other problem.  06/24/22- HIV negative      11/19/2021 PET scan IMPRESSION: 1. Complete metabolic response to therapy of cervical primary and pelvic nodal metastasis. 2. Bilateral nephrolithiasis.  02/24/2022 Brain MRI performed by Neurology for history of headaches IMPRESSION: Borderline Chiari 1 with slight crowding of the craniocervical junction. Otherwise, normal brain MRI.  04/09/2022 PET  IMPRESSION: No evidence of hypermetabolic recurrent or metastatic disease.  06/24/22 for surveillance. Pap was obtained but was insufficient for testing. Additionally, she had post coital spotting. Granulation tissue on cervix was observed and thought to be etiology. She was treated with silver nitrate. HPV was positive. It was recollected on 07/22/22 and reported as EPCA, ASCUS-H, HR HPV Positive.   08/05/22 colposcopy and biopsy with Dr Elby.  Colposcopy of the upper vagina and cervix was performed after application os acetic acid. The findings revealed cervical erythema and most prominent at 5-6 o'clock. A slightly raised friable area on the left vagina around 5 o'clock by the cervix. WE lateral left wall. The transformation zone was/was not seen.  .  CERVIX, 6:00; BIOPSY:  - INFLAMED GRANULATION TISSUE AND SEPARATE FRAGMENTS OF NECROINFLAMMATORY MATERIAL.  - AN EPITHELIAL SURFACE IS NOT PRESENT.  - STROMAL CHANGES CONSISTENT WITH PRIOR RADIATION THERAPY.  - NEGATIVE FOR DYSPLASIA AND MALIGNANCY.  Recommended RTC  for ECC and possible additional biopsies.   09/23/2022  Endocervix, curettage -  HIGH-GRADE SQUAMOUS INTRAEPITHELIAL LESION (HSIL).  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 The specimen was processed as a cell block due to the limited amount of material on gross examination. Sections demonstrate an extremely scant specimen with rare atypical cells that are p16 positive. Deeper sections were examined. 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 Features of superimposed radiation therapy effect are noted.  MRI 10/06/22 FINDINGS: Urinary Tract:  Bladder is within normal limits.   Bowel:  Visualized bowel is grossly unremarkable.   Vascular/Lymphatic: No evidence of aneurysm.   No suspicious pelvic lymphadenopathy.   Reproductive: No convincing residual cervical mass. Mild hypoperfusion along the right lateral aspect of the lower uterine segment/cervix on delayed imaging (series 26/image 40), but without associated restricted diffusion or other findings suggestive tumor.   Bilateral ovaries are within normal limits.   Other:  Small volume pelvic ascites.   Musculoskeletal: No focal osseous lesions.   IMPRESSION: No MR findings to suggest cervical cancer recurrence. No evidence of metastatic disease in the pelvis.  PET scan 10/14/22 FINDINGS: Mediastinal blood pool activity: SUV max 2.8   Liver activity: SUV max NA   NECK: No hypermetabolic cervical lymphadenopathy.   Incidental CT findings: None.   CHEST: No hypermetabolic thoracic lymphadenopathy.   No suspicious pulmonary nodules.   Right chest port terminates at the cavoatrial junction.   Incidental CT findings: None.   ABDOMEN/PELVIS: No abnormal hypermetabolism in the liver, spleen, pancreas, or adrenal glands.   No hypermetabolic abdominopelvic lymphadenopathy. Small bilateral inguinal nodes, likely reactive.   No focal cervical hypermetabolism to suggest recurrence on  PET.   Incidental CT findings: Five nonobstructing right renal calculi measuring up to 5 mm. No hydronephrosis. Trace pelvic fluid/ascites.   SKELETON: No focal hypermetabolic activity to suggest skeletal metastasis.   Incidental CT findings: None.   IMPRESSION: No findings suspicious for recurrent or metastatic disease.  In ED 12/15/22 for right flank pain thought to be due to chronic kidney stones.  Stone has not passed, but feeling better, no pain.  CT scan renal protocol FINDINGS: Lower chest: No acute abnormality.   Hepatobiliary: No focal liver abnormality is seen. No gallstones, gallbladder wall thickening, or biliary dilatation.   Pancreas: Unremarkable. No pancreatic ductal dilatation or surrounding inflammatory changes.   Spleen: Normal in size without focal abnormality.   Adrenals/Urinary Tract: Adrenal glands are within normal limits. Left kidney shows punctate lower pole renal stone without obstructive change. Right kidney demonstrates moderate hydronephrosis secondary to a 4 mm stone in the proximal right ureter. Multiple nonobstructing right renal stones are noted as well measuring up to 4 mm. More distal right ureter is unremarkable. The bladder is decompressed.   Stomach/Bowel: Stomach is within normal limits. Appendix appears normal. No evidence of bowel wall thickening, distention, or inflammatory changes.   Vascular/Lymphatic: No significant vascular findings are present. No enlarged abdominal or pelvic lymph nodes.   Reproductive: Uterus and bilateral adnexa are unremarkable.   Other: No abdominal wall hernia or abnormality. No abdominopelvic ascites.   Musculoskeletal: No acute or significant osseous findings.   IMPRESSION: 4 mm proximal right ureteral stone with obstructive change.   Bilateral nonobstructing renal calculi as described.   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   Cervical cancer, FIGO stage III (HCC) 06/24/2021   Cervical cancer (HCC) 06/11/2021   Dyshydrosis 11/21/2015    Past Medical History: Past Medical History:  Diagnosis Date   Allergic rhinitis    Allergy    Anxiety 2016   Cervical cancer (HCC)     Past Surgical History: Past Surgical History:  Procedure Laterality Date   IR IMAGING GUIDED PORT INSERTION  06/30/2021   IR REMOVAL TUN ACCESS W/ PORT W/O FL MOD SED  04/06/2023   TONSILECTOMY, ADENOIDECTOMY, BILATERAL MYRINGOTOMY AND TUBES Bilateral    TONSILLECTOMY     WISDOM TOOTH EXTRACTION       Family History: Family History  Problem Relation Age of Onset   Depression Mother    Hearing loss Mother    Kidney disease Mother    Miscarriages / [place] Mother    Obesity Mother    Heart disease Father    ADD / ADHD Father    Breast cancer Maternal Grandmother    Lung cancer Paternal Grandmother    Cancer Paternal Grandmother    COPD Paternal Grandmother    Heart attack Paternal Grandfather    ADD / ADHD Brother     Social History: Social History   Socioeconomic History   Marital status: Married    Spouse name: Not on file   Number of children: Not on file   Years of education: Not on file   Highest education level: Associate degree: academic program  Occupational History   Not on file  Tobacco Use   Smoking status: Never   Smokeless tobacco: Never  Vaping Use   Vaping status: Never Used  Substance and Sexual Activity   Alcohol use: Yes    Alcohol/week: 1.0 standard drink of alcohol    Types: 1 Glasses of wine per week     Comment: occasional--every couple of weeks.   Drug use: No   Sexual activity: Yes    Birth control/protection: None  Other Topics Concern   Not on file  Social History Narrative   Not on file   Social Drivers of Health   Financial Resource Strain: Low Risk  (10/04/2023)   Overall Financial Resource Strain (CARDIA)    Difficulty of Paying Living Expenses: Not hard at all  Food Insecurity: No Food Insecurity (10/04/2023)   Hunger Vital Sign    Worried About Running Out of Food in the Last Year: Never true    Ran Out of Food in the Last Year: Never true  Transportation Needs: No Transportation Needs (10/04/2023)   PRAPARE - Administrator, Civil Service (Medical): No    Lack of Transportation (Non-Medical): No  Physical Activity: Sufficiently Active (10/04/2023)   Exercise Vital Sign    Days of Exercise per Week: 4 days    Minutes of Exercise per Session: 60 min  Stress: No Stress Concern Present (10/04/2023)   [employer] of Occupational Health - Occupational Stress Questionnaire    Feeling of Stress: Only a little  Social Connections: Moderately Isolated (10/04/2023)   Social Connection and Isolation Panel    Frequency of Communication with Friends and Family: More than three times  a week    Frequency of Social Gatherings with Friends and Family: Twice a week    Attends Religious Services: Never    Database administrator or Organizations: No    Attends Engineer, structural: Not on file    Marital Status: Married  Catering manager Violence: Not on file    Allergies: No Known Allergies  Current Medications: Current Outpatient Medications  Medication Sig Dispense Refill   aspirin-acetaminophen -caffeine (EXCEDRIN MIGRAINE) 250-250-65 MG tablet Take by mouth every 6 (six) hours as needed for headache.     estradiol  (ESTRACE  VAGINAL) 0.1 MG/GM vaginal cream Place 1 Applicatorful vaginally at bedtime. For 2 weeks then 3 nights a week. 42.5 g 12   lidocaine   (XYLOCAINE ) 5 % ointment Apply 1 Application topically as needed (Bring to appointment for biopsy.). 35.44 g 0   Norethindrone -Ethinyl Estradiol -Fe Biphas (LO LOESTRIN FE) 1 MG-10 MCG / 10 MCG tablet Take 1 tablet by mouth daily. Take pills continuously for 28 day cycle for hormonal replacement 28 tablet 11   propranolol ER (INDERAL LA) 60 MG 24 hr capsule Take 60 mg by mouth daily.     SUMAtriptan (IMITREX) 100 MG tablet Take by mouth.     lidocaine -prilocaine  (EMLA ) cream APPLY TOPICALLY TO THE AFFECTED AREA 1 TIME (Patient not taking: Reported on 10/20/2023) 30 g 3   WEGOVY  1.7 MG/0.75ML SOAJ INJECT 1.7 MG UNDER THE SKIN ONCE WEEKLY (Patient not taking: Reported on 10/20/2023) 3 mL 1   No current facility-administered medications for this visit.   Facility-Administered Medications Ordered in Other Visits  Medication Dose Route Frequency Provider Last Rate Last Admin   sodium chloride  flush (NS) 0.9 % injection 10 mL  10 mL Intravenous PRN Melanee Annah BROCKS, MD   10 mL at 07/07/21 0841   Review of Systems General:  no complaints Skin: no complaints Eyes: no complaints HEENT: no complaints Breasts: no complaints Pulmonary: no complaints Cardiac: no complaints Gastrointestinal: no complaints Genitourinary/Sexual: no complaints Ob/Gyn: no complaints Musculoskeletal: no complaints Hematology: no complaints Neurologic/Psych: no complaints   Objective:  Physical Examination:  BP 106/64   Pulse (!) 59   Temp 97.6 F (36.4 C)   Resp 20   Wt 173 lb 11.2 oz (78.8 kg)   SpO2 100%   BMI 28.04 kg/m    Vitals:   10/20/23 1510  BP: 106/64  Pulse: (!) 59  Resp: 20  Temp: 97.6 F (36.4 C)  SpO2: 100%    ECOG Performance Status: 0 - Asymptomatic  GENERAL: Patient is a well appearing female in no acute distress HEENT: Atraumatic normocephalic.  LUNGS:  Nornal respiratory effort.  HEART: normal no mummers ABDOMEN:  Soft, nontender. Nondistended.  No masses or ascites EXTREMITIES:   No peripheral edema.   NEURO:  Nonfocal. Well oriented.  Appropriate affect.  Pelvic: Chaperoned with CMA EGBUS: no lesions Cervix:  flush, no lesions Vagina: no lesions, but upper vagina again agglutinated. Broke up adhesions and upper vagina and cervix looks atrophic.  Uterus: not grossly enlarged; seems to be normal size, nontender, mobile BME: no palpable masses, parametria is smooth and there is no nodularity Rectovaginal: confirms normal cervix without nodularity  Lab Review No labs on site today  Assessment:  Dawn Camacho is a 33 y.o. G0 female diagnosed with IIIC1 invasive squamous cell cervical cancer 3/23 with 4.2 cm cervical tumor and positive external iliac nodes on PET scan s/p chemoradiation with negative posttreatment PET.    Vaginal bleeding 2/24 and  PET/CT negative.  Abnormal Pap with ASC-H and HR HPV positive 5/24.  Colposcopic vaginal biopsy 6/24 showed just inflammation.  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  Pelvic MRI and PET scan 8/24 negative for recurrence of cancer. Plan for continued close surveillance with PAPs and imaging.  PAPs x 2 earlier in 2025 insufficient. HPV+.   PAP 5/25 was normal.  PET scan 5/25 normal.    Today upper vagina again agglutinated. Broke up filmy adhesions in upper vagina again and cervix does not looking concerning.  Not using dilator, but needs to again.  Had used estrogen cream too in the past.  She is sexually active.     Kidney stone in 10/24.   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   Peripheral neuropathy has resolved.   Medical co-morbidities complicating care: anxiety.  Plan:   Problem List Items Addressed This Visit       Genitourinary   Cervical cancer [facility]) - Primary   Discussed that with PET and MRI negative for cancer in the cervix 8/24 recommend close surveillance with exams q2 months.  LEEP/[facility] would be technically challenging  and the cellular changes on cytology and biopsy are likely reactive due to radiation.  Hysterectomy would be an option if there is more concern for recurrence in the cervix, but risk of complications including fistula are significant post full radiation for cervical cancer.   PAP 1/25 and 4/25 unsatisfactory.  HR HPV+ PAP repeated 5/25 after breaking down upper vaginal adhesions and may have entered canal with cytobrush. PAP normal.  Broke down upper vaginal adhesions again and encouraged her to use larger dilator.    Switch to regular LOESTRIN in view of hot flashes. Bone Health-calcium 1200 mg and vitamin D 1000 iu daily for prevention of bone loss. Encouraged weight bearing exercise life long.  RTC in 3 months with repeat PAP.    The patient's diagnosis, an outline of the further diagnostic and laboratory studies which will be required, the recommendation, and alternatives were discussed.  All questions were answered to the patient's satisfaction.  Prentice Agent, MD

## 2023-11-29 ENCOUNTER — Encounter: Payer: Self-pay | Admitting: Physician Assistant

## 2024-02-02 ENCOUNTER — Inpatient Hospital Stay: Attending: Obstetrics and Gynecology | Admitting: Obstetrics and Gynecology

## 2024-02-02 DIAGNOSIS — C539 Malignant neoplasm of cervix uteri, unspecified: Secondary | ICD-10-CM

## 2024-02-02 DIAGNOSIS — R87618 Other abnormal cytological findings on specimens from cervix uteri: Secondary | ICD-10-CM | POA: Diagnosis not present

## 2024-02-02 DIAGNOSIS — Z79899 Other long term (current) drug therapy: Secondary | ICD-10-CM | POA: Diagnosis not present

## 2024-02-02 DIAGNOSIS — Y842 Radiological procedure and radiotherapy as the cause of abnormal reaction of the patient, or of later complication, without mention of misadventure at the time of the procedure: Secondary | ICD-10-CM | POA: Insufficient documentation

## 2024-02-02 DIAGNOSIS — C775 Secondary and unspecified malignant neoplasm of intrapelvic lymph nodes: Secondary | ICD-10-CM | POA: Diagnosis not present

## 2024-02-02 DIAGNOSIS — N930 Postcoital and contact bleeding: Secondary | ICD-10-CM | POA: Diagnosis not present

## 2024-02-02 DIAGNOSIS — Z87442 Personal history of urinary calculi: Secondary | ICD-10-CM | POA: Diagnosis not present

## 2024-02-02 DIAGNOSIS — Z803 Family history of malignant neoplasm of breast: Secondary | ICD-10-CM | POA: Insufficient documentation

## 2024-02-02 DIAGNOSIS — Z79890 Hormone replacement therapy (postmenopausal): Secondary | ICD-10-CM | POA: Diagnosis not present

## 2024-02-02 DIAGNOSIS — N938 Other specified abnormal uterine and vaginal bleeding: Secondary | ICD-10-CM | POA: Diagnosis not present

## 2024-02-02 DIAGNOSIS — Z801 Family history of malignant neoplasm of trachea, bronchus and lung: Secondary | ICD-10-CM | POA: Insufficient documentation

## 2024-02-02 DIAGNOSIS — Z8249 Family history of ischemic heart disease and other diseases of the circulatory system: Secondary | ICD-10-CM | POA: Diagnosis not present

## 2024-02-02 DIAGNOSIS — Z825 Family history of asthma and other chronic lower respiratory diseases: Secondary | ICD-10-CM | POA: Diagnosis not present

## 2024-02-02 DIAGNOSIS — Z923 Personal history of irradiation: Secondary | ICD-10-CM | POA: Insufficient documentation

## 2024-02-02 DIAGNOSIS — E669 Obesity, unspecified: Secondary | ICD-10-CM | POA: Diagnosis not present

## 2024-02-02 DIAGNOSIS — R87610 Atypical squamous cells of undetermined significance on cytologic smear of cervix (ASC-US): Secondary | ICD-10-CM | POA: Insufficient documentation

## 2024-02-02 DIAGNOSIS — Z9221 Personal history of antineoplastic chemotherapy: Secondary | ICD-10-CM | POA: Diagnosis not present

## 2024-02-02 DIAGNOSIS — Z818 Family history of other mental and behavioral disorders: Secondary | ICD-10-CM | POA: Diagnosis not present

## 2024-02-02 DIAGNOSIS — Z8419 Family history of other disorders of kidney and ureter: Secondary | ICD-10-CM | POA: Insufficient documentation

## 2024-02-02 DIAGNOSIS — Z822 Family history of deafness and hearing loss: Secondary | ICD-10-CM | POA: Diagnosis not present

## 2024-02-02 DIAGNOSIS — N202 Calculus of kidney with calculus of ureter: Secondary | ICD-10-CM | POA: Diagnosis not present

## 2024-02-02 DIAGNOSIS — Z6828 Body mass index (BMI) 28.0-28.9, adult: Secondary | ICD-10-CM | POA: Insufficient documentation

## 2024-02-02 DIAGNOSIS — Z9089 Acquired absence of other organs: Secondary | ICD-10-CM | POA: Diagnosis not present

## 2024-02-02 DIAGNOSIS — B977 Papillomavirus as the cause of diseases classified elsewhere: Secondary | ICD-10-CM | POA: Insufficient documentation

## 2024-02-02 DIAGNOSIS — Z8349 Family history of other endocrine, nutritional and metabolic diseases: Secondary | ICD-10-CM | POA: Diagnosis not present

## 2024-02-02 MED ORDER — LEVONORGESTREL-ETHINYL ESTRAD 90-20 MCG PO TABS: 1.0000 | ORAL_TABLET | Freq: Every day | ORAL | 12 refills | Status: AC

## 2024-02-02 MED ORDER — ESTRADIOL 0.01 % VA CREA: 1.0000 | TOPICAL_CREAM | Freq: Every day | VAGINAL | 12 refills | Status: AC

## 2024-02-02 MED ORDER — DROSPIRENONE-ETHINYL ESTRADIOL 3-0.02 MG PO TABS: 1.0000 | ORAL_TABLET | Freq: Every day | ORAL | 12 refills | Status: DC

## 2024-02-02 NOTE — Progress Notes (Signed)
 Gynecologic Oncology Interval Visit   Referring Provider: Dr Verdon  Chief Concern: cervical cancer, surveillance visit Subjective:  Dawn Camacho is a 33 y.o. G0 female who was seen in consultation from Dr Verdon for cervical cancer. Treated with cisplatin  (07/07/21-08/05/21 ) and radiation 07/10/2021-08/29/2021 followed by T&O with Dr Lurline at [facility], 7/10 - 09/15/21. She returns to clinic for surveillance.   Previously she was reporting mild spotting after intercourse. She was having some hot flashes with current HRT, Low LOestrin and was switched to Loestrin. Use of vaginal dilator was encouraged due to adhesions at top of vagina.   PAP 1/25 unsatisfactory.  HR HPV + PAP 05/26/23 unsatisfactory.  HR HPV + PAP 5/25 normal  Today she complains of daily bloating. Has minimized use of vaginal estrogen due to cost. Continues dilators. Sexually active. Denies bleeding or pain.     Gynecologic Oncology History Dawn Camacho was seen in consultation from Dr. Dr Verdon for cervical cancer.  05/08/21 seen by Midwife for post coital spotting x 1.5 months.  Cervical exam: Large, pink, friable and highly vascularized, non-tender, not smooth, irregular cervical growth extending from 11-5.   PAP ASCUS-HR/HPV+. Patient's last menstrual period was 04/29/2021.    Colposcopy confirmed irregular cervical growth extending from 11-5:00. 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   06/12/2021 MRI Pelvis IMPRESSION: 1. 4.2 x 3.7 x 2.9 cm cervical mass involving the anterior aspect of the cervix and also likely involving the anterior aspect of the lower uterine segment. This extends into the vagina mainly on the left side. No MR findings to suggest involvement of the bladder, urethra or rectum. 2. Borderline enlarged bilateral pelvic sidewall lymph nodes worrisome for metastatic disease.  06/19/2021 PET scan IMPRESSION: 1. Hypermetabolic activity of the cervix,  compatible with primary malignancy. 2. Hypermetabolic subcentimeter bilateral external iliac lymph nodes, concerning for metastatic disease. 3. Mildly hypermetabolic left inguinal lymph node, concerning for additional site of metastatic disease. 4. Bilateral nonobstructing renal stones.  06/30/2021 Port placement  60 Gy to cervix and nodes at [facility] under the care of Dr. Lenn in 07/10/2021-08/29/2021. T&O at [facility] with Dr. Lurline.  7/10 - 09/15/21 Cervix, T&O/R Total Dose 2400 cGy (each fraction 600 cGy)  Post treatment PET 04/09/22 was negative.   12/17/2021 Dilator teaching to avoid vaginal stenosis, performed at [facility].   Married and used Nuva ring for contraception.  Has not had a strong desire for children, but not sure for certain.   Had 2 Gardasil vaccinations, but then last in the series was not done due to recall or some other problem.  06/24/22- HIV negative      11/19/2021 PET scan IMPRESSION: 1. Complete metabolic response to therapy of cervical primary and pelvic nodal metastasis. 2. Bilateral nephrolithiasis.  02/24/2022 Brain MRI performed by Neurology for history of headaches IMPRESSION: Borderline Chiari 1 with slight crowding of the craniocervical junction. Otherwise, normal brain MRI.  04/09/2022 PET  IMPRESSION: No evidence of hypermetabolic recurrent or metastatic disease.  06/24/22 for surveillance. Pap was obtained but was insufficient for testing. Additionally, she had post coital spotting. Granulation tissue on cervix was observed and thought to be etiology. She was treated with silver nitrate. HPV was positive. It was recollected on 07/22/22 and reported as EPCA, ASCUS-H, HR HPV Positive.   08/05/22 colposcopy and biopsy with Dr Elby.  Colposcopy of the upper vagina and cervix was performed after application os acetic acid. The findings revealed cervical erythema and most prominent at  5-6 o'clock. A slightly raised friable area on the left vagina around 5 o'clock by the  cervix. WE lateral left wall. The transformation zone was/was not seen.  -  CERVIX, 6:00; BIOPSY:  - INFLAMED GRANULATION TISSUE AND SEPARATE FRAGMENTS OF NECROINFLAMMATORY MATERIAL.  - AN EPITHELIAL SURFACE IS NOT PRESENT.  - STROMAL CHANGES CONSISTENT WITH PRIOR RADIATION THERAPY.  - NEGATIVE FOR DYSPLASIA AND MALIGNANCY.  Recommended RTC for ECC and possible additional biopsies.   09/23/2022  Endocervix, curettage - HIGH-GRADE SQUAMOUS INTRAEPITHELIAL LESION (HSIL).  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 The specimen was processed as a cell block due to the limited amount of material on gross examination. Sections demonstrate an extremely scant specimen with rare atypical cells that are p16 positive. Deeper sections were examined. 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 Features of superimposed radiation therapy effect are noted.  MRI 10/06/22 FINDINGS: Urinary Tract:  Bladder is within normal limits. Bowel:  Visualized bowel is grossly unremarkable. Vascular/Lymphatic: No evidence of aneurysm. No suspicious pelvic lymphadenopathy. Reproductive: No convincing residual cervical mass. Mild hypoperfusion along the right lateral aspect of the lower  uterine segment/cervix on delayed imaging (series 26/image 40), but without associated restricted diffusion or other findings suggestive tumor. Bilateral ovaries are within normal limits. Other:  Small volume pelvic ascites. Musculoskeletal: No focal osseous lesions.   IMPRESSION: No MR findings to suggest cervical cancer recurrence. No evidence of metastatic disease in the pelvis.  PET scan 10/14/22 FINDINGS: Mediastinal blood pool activity: SUV max 2.8 Liver activity: SUV max NA NECK: No hypermetabolic cervical lymphadenopathy. Incidental CT findings: None. CHEST: No hypermetabolic thoracic lymphadenopathy. No  suspicious pulmonary nodules. Right chest port terminates at the cavoatrial junction. Incidental CT findings: None. ABDOMEN/PELVIS: No abnormal hypermetabolism in the liver, spleen, pancreas, or adrenal glands. No hypermetabolic abdominopelvic lymphadenopathy. Small bilateral inguinal nodes, likely reactive. No focal cervical hypermetabolism to suggest recurrence on PET Incidental CT findings: Five nonobstructing right renal calculi measuring up to 5 mm. No hydronephrosis. Trace pelvic fluid/ascites. SKELETON: No focal hypermetabolic activity to suggest skeletal metastasis. Incidental CT findings: None.   IMPRESSION: No findings suspicious for recurrent or metastatic disease.   ED 12/15/22 for right flank pain thought to be due to chronic kidney stones.  Stone has not passed, but feeling better, no pain.   CT scan renal protocol- 12/15/22 FINDINGS: Lower chest: No acute abnormality. Hepatobiliary: No focal liver abnormality is seen. No gallstones, gallbladder wall thickening, or biliary dilatation. Pancreas: Unremarkable. No pancreatic ductal dilatation or surrounding inflammatory changes. Spleen: Normal in size without focal abnormality. Adrenals/Urinary Tract: Adrenal glands are within normal limits. Left kidney shows punctate lower pole renal stone without obstructive change. Right kidney demonstrates moderate hydronephrosis secondary to a 4 mm stone in the proximal right  ureter. Multiple nonobstructing right renal stones are noted as well measuring up to 4 mm. More distal right ureter is unremarkable. The bladder is decompressed. Stomach/Bowel: Stomach is within normal limits. Appendix appears normal. No evidence of bowel wall thickening, distention, or inflammatory changes. Vascular/Lymphatic: No significant vascular findings are present. No enlarged abdominal or pelvic lymph nodes. Reproductive: Uterus and bilateral adnexa are unremarkable. Other: No abdominal wall hernia or  abnormality. No abdominopelvic ascites. Musculoskeletal: No acute or significant osseous findings.   IMPRESSION: 4 mm proximal right ureteral stone with obstructive change. Bilateral nonobstructing renal calculi as described.   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   Cervical cancer, FIGO stage III (HCC) 06/24/2021   Cervical cancer (HCC) 06/11/2021   Dyshydrosis 11/21/2015    Past Medical History: Past Medical History:  Diagnosis Date   Allergic rhinitis    Allergy    Anxiety 2016   Cervical cancer (HCC)     Past Surgical History: Past Surgical History:  Procedure Laterality Date   IR IMAGING GUIDED PORT INSERTION  06/30/2021   IR REMOVAL TUN ACCESS W/ PORT W/O FL MOD SED  04/06/2023   TONSILECTOMY, ADENOIDECTOMY, BILATERAL MYRINGOTOMY AND TUBES Bilateral    TONSILLECTOMY     WISDOM TOOTH EXTRACTION       Family History: Family History  Problem Relation Age of Onset   Depression Mother    Hearing loss Mother    Kidney disease Mother    Miscarriages / Stillbirths Mother    Obesity Mother    Heart disease Father    ADD / ADHD Father    Breast cancer Maternal Grandmother    Lung cancer Paternal Grandmother    Cancer Paternal Grandmother    COPD Paternal Grandmother    Heart attack Paternal Grandfather    ADD / ADHD Brother     Social History: Social History   Socioeconomic History   Marital status: Married    Spouse name: Not on file   Number of children: Not on file   Years of education: Not on file   Highest  education level: Associate degree: academic program  Occupational History   Not on file  Tobacco Use   Smoking status: Never   Smokeless tobacco: Never  Vaping Use   Vaping status: Never Used  Substance and Sexual Activity   Alcohol use: Yes    Alcohol/week: 1.0 standard drink of alcohol    Types: 1 Glasses of wine per week    Comment: occasional--every couple of weeks.   Drug use: No   Sexual activity: Yes    Birth control/protection: None  Other Topics Concern   Not on file  Social History Narrative   Not on file   Social Drivers of Health   Financial Resource Strain: Low Risk  (10/04/2023)   Overall Financial Resource Strain (CARDIA)    Difficulty of Paying Living Expenses: Not hard at all  Food Insecurity: No Food Insecurity (10/04/2023)   Hunger Vital Sign    Worried About Running Out of Food in the Last Year: Never true    Ran Out of Food in the Last Year: Never true  Transportation Needs: No Transportation Needs (10/04/2023)   PRAPARE - Administrator, Civil Service (Medical): No    Lack of Transportation (Non-Medical): No  Physical Activity: Sufficiently Active (10/04/2023)   Exercise Vital Sign    Days of Exercise per Week: 4 days    Minutes of Exercise per Session: 60 min  Stress: No Stress Concern Present (10/04/2023)   [employer] of Occupational Health - Occupational Stress Questionnaire    Feeling of Stress: Only a little  Social Connections: Moderately Isolated (10/04/2023)   Social Connection and Isolation  Panel    Frequency of Communication with Friends and Family: More than three times a week    Frequency of Social Gatherings with Friends and Family: Twice a week    Attends Religious Services: Never    Database Administrator or Organizations: No    Attends Engineer, Structural: Not on file    Marital Status: Married  Catering Manager Violence: Not on file    Allergies: No Known Allergies  Current Medications: Current  Outpatient Medications  Medication Sig Dispense Refill   aspirin-acetaminophen -caffeine (EXCEDRIN MIGRAINE) 250-250-65 MG tablet Take by mouth every 6 (six) hours as needed for headache.     estradiol  (ESTRACE ) 0.01 % CREA vaginal cream Place 1 Applicatorful vaginally at bedtime. 2-3 times a week for vaginal atrophy due to premature menopause 42.5 g 12   levonorgestrel -ethinyl estradiol  (LYBREL) 90-20 MCG tablet Take 1 tablet by mouth daily. For premature menopause 28 tablet 12   lidocaine  (XYLOCAINE ) 5 % ointment Apply 1 Application topically as needed (Bring to appointment for biopsy.). 35.44 g 0   lidocaine -prilocaine  (EMLA ) cream APPLY TOPICALLY TO THE AFFECTED AREA 1 TIME 30 g 3   propranolol ER (INDERAL LA) 60 MG 24 hr capsule Take 60 mg by mouth daily.     SUMAtriptan (IMITREX) 100 MG tablet Take by mouth.     No current facility-administered medications for this visit.   Facility-Administered Medications Ordered in Other Visits  Medication Dose Route Frequency Provider Last Rate Last Admin   sodium chloride  flush (NS) 0.9 % injection 10 mL  10 mL Intravenous PRN Melanee Annah BROCKS, MD   10 mL at 07/07/21 0841   Review of Systems General:  no complaints Skin: no complaints Eyes: no complaints HEENT: no complaints Breasts: no complaints Pulmonary: no complaints Cardiac: no complaints Gastrointestinal: bloating Genitourinary/Sexual: no complaints Ob/Gyn: no complaints Musculoskeletal: no complaints Hematology: no complaints Neurologic/Psych: no complaints   Objective:  Physical Examination:  BP 117/80 (BP Location: Left Arm, Patient Position: Sitting)   Pulse (!) 52   Temp 98.6 F (37 C) (Tympanic)   Ht 5' 6 (1.676 m)   Wt 179 lb (81.2 kg)   SpO2 100%   BMI 28.89 kg/m    Vitals:   02/02/24 1500  BP: 117/80  Pulse: (!) 52  Temp: 98.6 F (37 C)  SpO2: 100%   ECOG Performance Status: 0 - Asymptomatic  GENERAL: Patient is a well appearing female in no acute  distress HEENT: Atraumatic normocephalic.  LUNGS:  Nornal respiratory effort.  HEART: normal no mummers ABDOMEN:  Soft, nontender. Nondistended.  No masses or ascites EXTREMITIES:  No peripheral edema.   NEURO:  Nonfocal. Well oriented.  Appropriate affect.  Pelvic: Chaperoned with CMA EGBUS: no lesions Cervix: Unable to visualize due to vaginal agglutination Vagina: no lesions, but upper vagina again agglutinated. Approximately 5 cm in depth. Pap obtained Uterus: not grossly enlarged; seems to be normal size, nontender, mobile BME: no palpable masses, parametria is smooth and there is no nodularity Rectovaginal:  deferred  Lab Review No labs on site today  Assessment:  Dawn Camacho is a 33 y.o. G0 female diagnosed with IIIC1 invasive squamous cell cervical cancer 3/23 with 4.2 cm cervical tumor and positive external iliac nodes on PET scan s/p chemoradiation with negative posttreatment PET.    Vaginal bleeding 2/24 and PET/CT negative.  Abnormal Pap with ASC-H and HR HPV positive 5/24.  Colposcopic vaginal biopsy 6/24 showed just inflammation.  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  Pelvic MRI and PET scan 8/24 negative for recurrence of cancer. Plan for continued close surveillance with PAPs and imaging.  PAPs x 2 earlier in 2025 insufficient. HPV+.   PAP 5/25 was normal.  PET scan 5/25 normal.    Exam reassuring but limited due to vaginal agglutination  Kidney stone in 10/24.   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    Peripheral neuropathy has resolved.   Medical co-morbidities complicating care: anxiety.  Plan:   Problem List Items Addressed This Visit       Genitourinary   Cervical cancer (HCC) - Primary   Relevant Orders   IGP, Aptima HPV   Other Visit Diagnoses       Pap smear abnormality of cervix/human papillomavirus (HPV) positive          Discussed that  with PET and MRI negative for cancer in the cervix 8/24 recommend close surveillance with exams q2 months. PAP 1/25 and 4/25 unsatisfactory.  HR HPV+. PAP repeated 5/25 after breaking down upper vaginal adhesions and may have entered canal with cytobrush- PAP normal. 06/2023 PET was negative for active malignancy. We recollected her pap today however, cervix can no longer be accessed due to post radiation changes. We will plan to follow up for treatment options based on her pap results. If normal, we will plan to see her back in 4 months.   We can begin rotating with Dr Verdon and I've asked her to reach out to [facility] for appointment in 8 months.   Previously on Lo Loestrin but switched to Loestrin at last visit in view of hot flashes. Ongoing bloating which is likely related to progestin/norethinedrone. Hot flashes are better controlled. Discussed rotatin to Yaz but she took this previously and did not tolerate therefore, will rotate to Amethyst/levonorgestrel -ethinyl estradiol .   She will continue calcium 1200 mg and vitamin D 1000 mcg daily for prevention of bone loss. Recommend weight bearing exercise life long.   Recommend she continue vaginal estrogen and dilator exercises to help keep vagina open and minimize atrophic changes. Due to cost of estrace  cream/copay, I provided her with a paper prescription so that she can utilize [employer].   The patient's diagnosis, an outline of the further diagnostic and laboratory studies which will be required, the recommendation, and alternatives were discussed.  All questions were answered to the patient's satisfaction.  Tinnie Dawn, DNP, AGNP-C, AOCNP [facility] [phone] (clinic)  I personally had a face to face interaction and evaluated the patient jointly with the NP, Ms. Tinnie Dawn.  I have reviewed her history and available records and have performed the key portions of the physical exam including lymph node survey,  abdominal exam, pelvic exam with my findings confirming those documented above by the APP.  I have discussed the case with the APP and the patient.  I agree with the above documentation, assessment and plan which was fully formulated by me.  Counseling was completed by me.   I personally saw the patient and performed a substantive portion of this encounter in conjunction with the listed APP as documented above.  Lenette Rau Isidor Constable, MD

## 2024-02-03 ENCOUNTER — Telehealth: Payer: Self-pay

## 2024-02-03 ENCOUNTER — Telehealth: Payer: Self-pay | Admitting: Physician Assistant

## 2024-02-03 NOTE — Telephone Encounter (Signed)
 Converted to rx request

## 2024-02-03 NOTE — Telephone Encounter (Signed)
 LOV- 10/05/2023 NOV- 05/31/2024 LRF- 07/30/2023 per dispense history however the Wegovy  is not on her current medication list

## 2024-02-03 NOTE — Telephone Encounter (Signed)
 [facility] faxed refill request for the following medications:  Wegovy  2.4mg /0.27ml pen inj    Please advise.

## 2024-02-07 ENCOUNTER — Other Ambulatory Visit: Payer: Self-pay

## 2024-02-08 NOTE — Telephone Encounter (Signed)
 Noted

## 2024-03-01 ENCOUNTER — Encounter: Payer: Self-pay | Admitting: Nurse Practitioner

## 2024-03-02 ENCOUNTER — Encounter: Payer: Self-pay | Admitting: Nurse Practitioner

## 2024-03-13 IMAGING — MR MR PELVIS WO/W CM
16 of 23 series · 30 of 48 positions shown · IV contrast (9ml Gadavist)
Comparison: CT scan 11/17/2020

CLINICAL DATA: Cervical cancer.

EXAM:
MRI PELVIS WITHOUT AND WITH CONTRAST
TECHNIQUE: Multiplanar multisequence MR imaging of the pelvis was performed
both before and after administration of intravenous contrast.
CONTRAST:  9mL GADAVIST GADOBUTROL 1 MMOL/ML IV SOLN

[Series 2: T2 · coronal · 5.0mm · 1.41mm/px · 1 of 30 slices shown (1 of 2)]
[im 1/30]
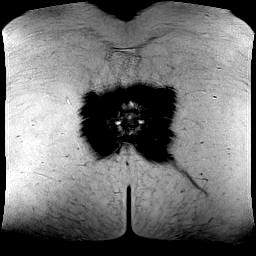

[Series 3: sag tse · sagittal · 5.0mm · 0.75mm/px · 1 of 33 slices shown]
[im 1/33]
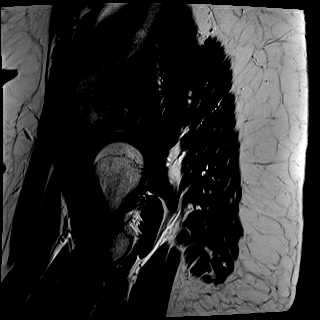

[Series 4: T2 fat-sat · axial · 5.0mm · 0.90mm/px · 1 of 37 slices shown]
[im 1/37]
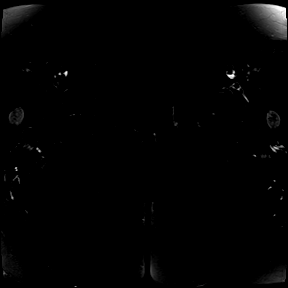

[Series 5: DIXON · axial · 3.0mm · 1.19mm/px · 1 of 72 slices shown (1 of 2)]
[im 1/72]
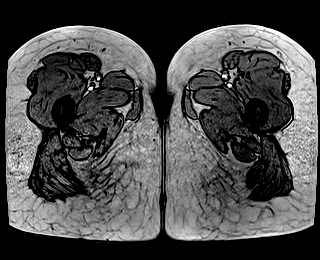

[Series 6: DIXON · axial · 3.0mm · 1.19mm/px · 1 of 72 slices shown (2 of 2)]
[im 1/72]
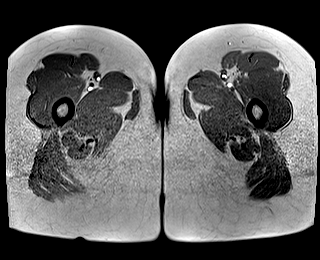

[Series 7: ax dwi_tracew · axial · 6.0mm · 1.16mm/px · z∈[-143,+95]mm · 2 of 102 slices shown]
[im 1/102]
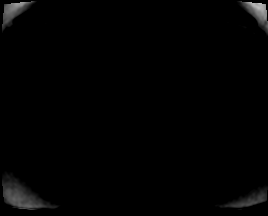
[im 102/102]
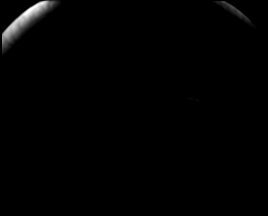

[Series 8: ax dwi_adc · axial · 6.0mm · 1.16mm/px · 1 of 34 slices shown]
[im 1/34]
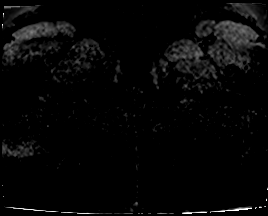

[Series 9: sag dwi_tracew · sagittal · 6.0mm · 1.16mm/px · 3 of 102 slices shown]
[im 1/102]
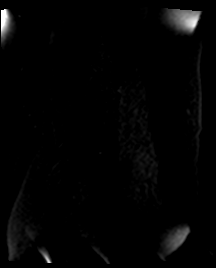
[im 51/102]
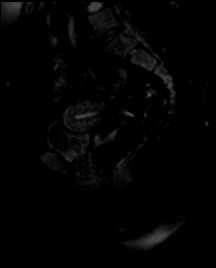
[im 102/102]
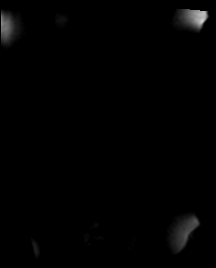

[Series 10: sag dwi_adc · sagittal · 6.0mm · 1.16mm/px · 1 of 34 slices shown]
[im 1/34]
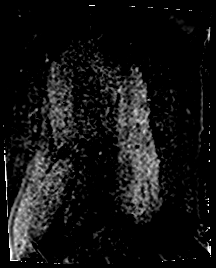

[Series 11: T2 · axial · 3.0mm · 0.75mm/px · 1 of 37 slices shown (2 of 2)]
[im 1/37]
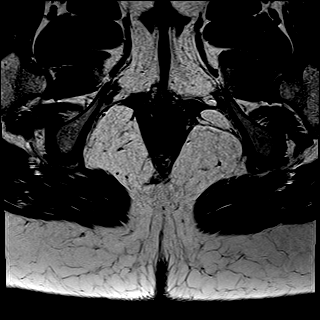

[Series 12: T1 dynamic fat-sat · axial · 3.0mm · 0.47mm/px · z∈[-182,+27]mm · 3 of 80 slices shown (1 of 6)]
[im 1/80]
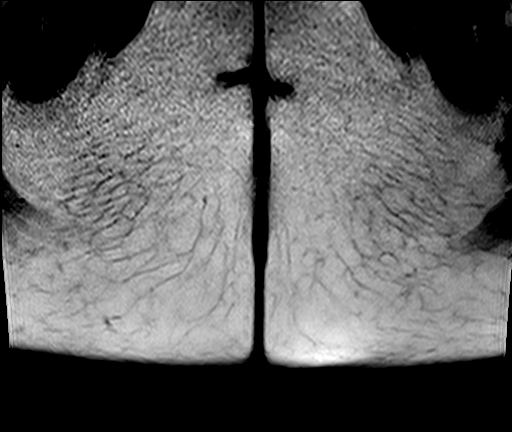
[im 40/80]
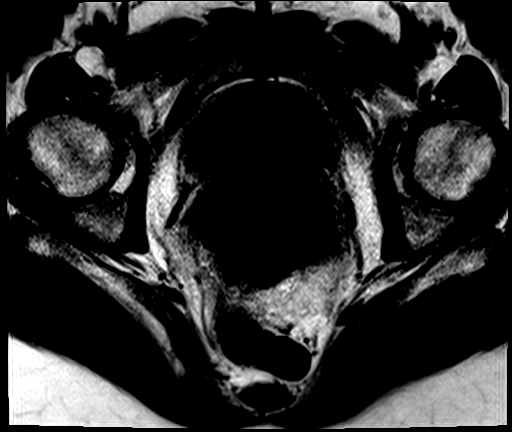
[im 80/80]
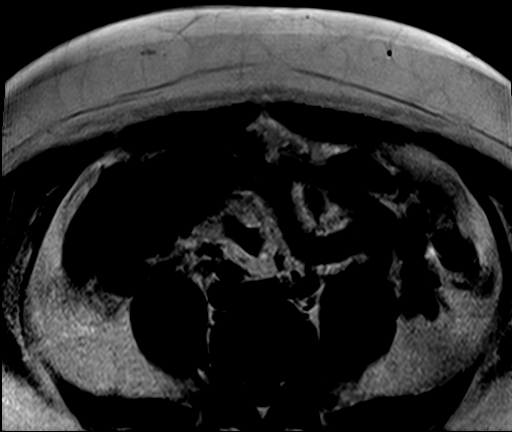

[Series 13: T1 dynamic fat-sat · axial · 3.0mm · 0.47mm/px · z∈[-182,+27]mm · 3 of 80 slices shown (2 of 6)]
[im 1/80]
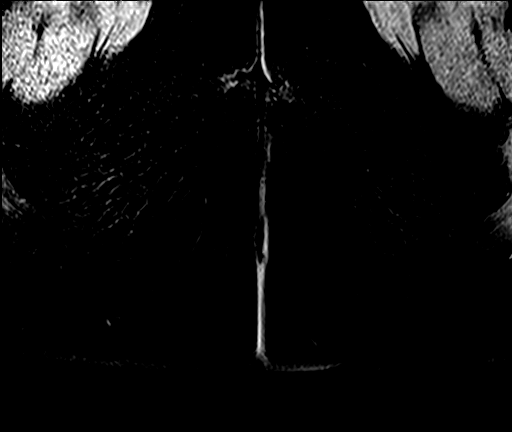
[im 40/80]
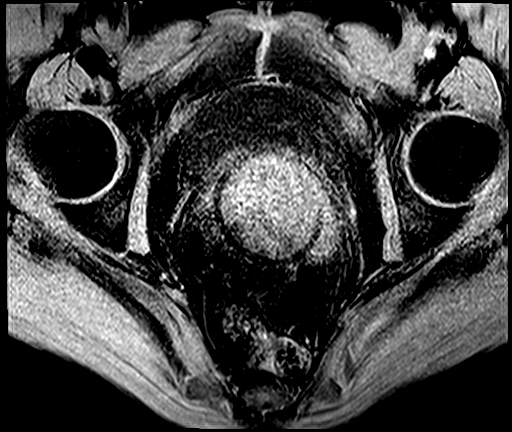
[im 80/80]
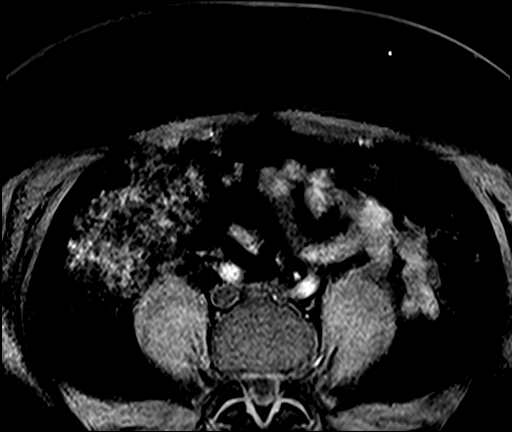

[Series 16: T1 dynamic fat-sat · axial · 3.0mm · 0.47mm/px · z∈[-182,+27]mm · 3 of 80 slices shown (3 of 6)]
[im 1/80]
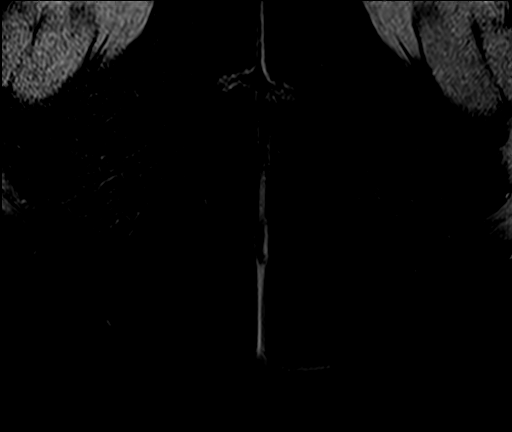
[im 40/80]
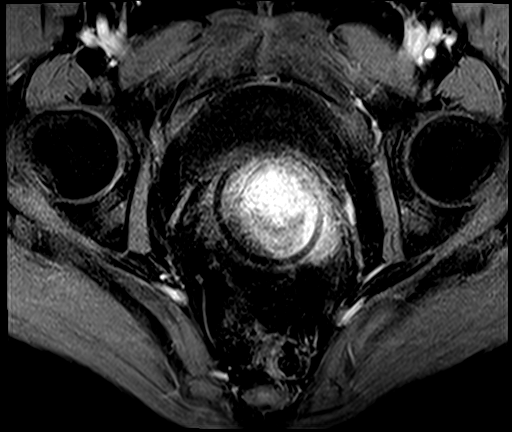
[im 80/80]
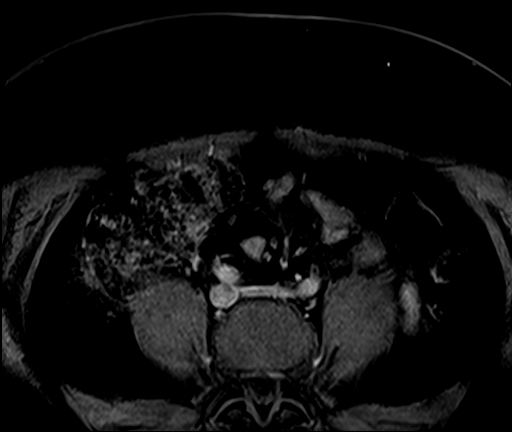

[Series 17: T1 dynamic fat-sat · axial · 3.0mm · 0.47mm/px · z∈[-182,+27]mm · 3 of 80 slices shown (4 of 6)]
[im 1/80]
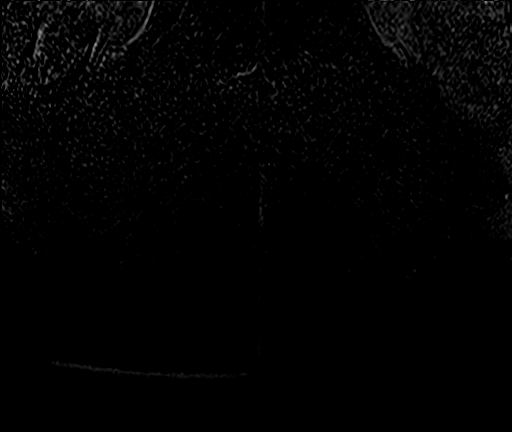
[im 40/80]
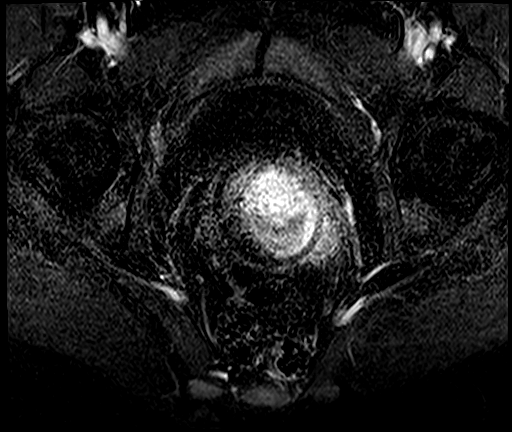
[im 80/80]
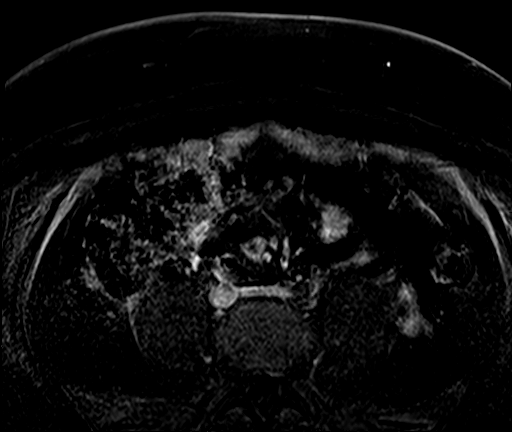

[Series 20: T1 dynamic fat-sat · axial · 3.0mm · 0.47mm/px · z∈[-182,+27]mm · 3 of 80 slices shown (5 of 6)]
[im 1/80]
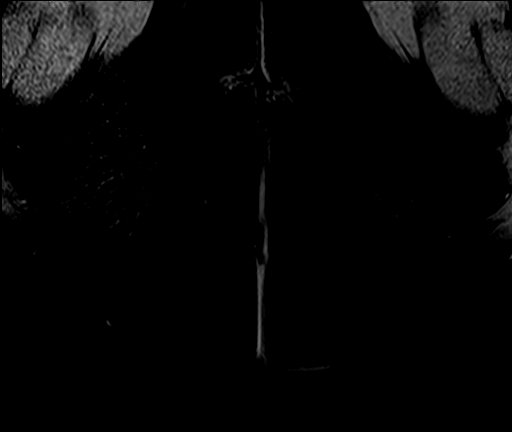
[im 40/80]
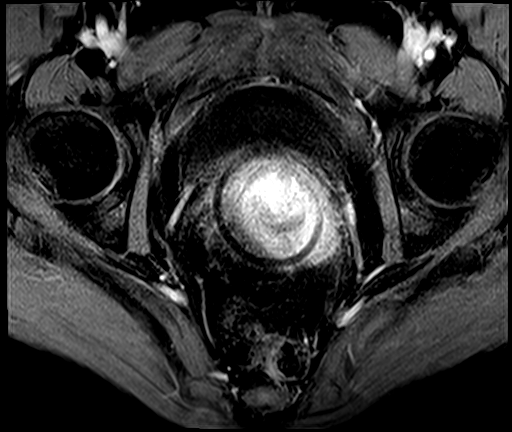
[im 80/80]
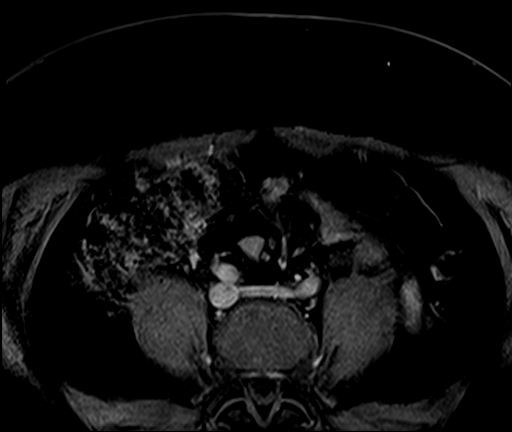

[Series 21: T1 dynamic fat-sat · axial · 3.0mm · 0.47mm/px · z∈[-182,-79]mm · 2 of 80 slices shown (6 of 6)]
[im 1/80]
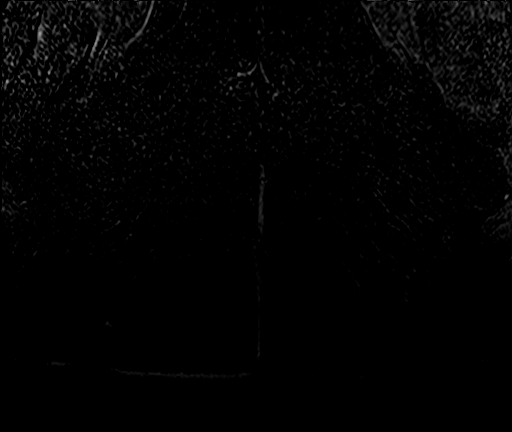
[im 40/80]
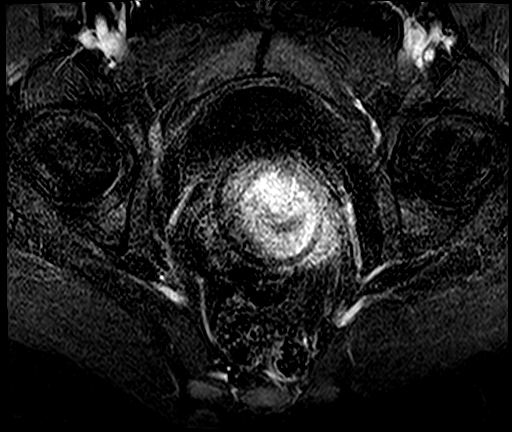

[30 of 48 positions shown; findings below may reference images not displayed]

FINDINGS: Urinary Tract: The bladder is normal. No bladder mass or asymmetric
bladder wall thickening. No bladder calculi. The distal ureters are
normal.

Bowel: The rectum, sigmoid colon and visualized small bowel loops
are unremarkable. Clear fat plane between the vagina and rectum
without any evidence of rectal wall invasion.

Vascular/Lymphatic: The vascular structures are normal. There are
mildly enlarged bilateral pelvic sidewall lymph nodes worrisome for
metastatic disease. Right-sided medial external iliac node on image
number 28/20 measures 9 mm. Medial external iliac lymph node on
22/20 measures 7 mm. No mesorectal or sigmoid mesocolon adenopathy.
No inguinal adenopathy.

Reproductive: There is an enhancing cervical mass. This measures
approximately 4.2 x 3.7 x 2.9 cm. This involves the anterior aspect
of the cervix involving the anterior cervical stroma and also likely
involving the anterior aspect of the lower uterine segment. The
tumor extends into the vagina mainly on the left side. I do not see
any definite involvement of the endocervical canal or the
endometrial canal. The uterine body is otherwise normal.

Both ovaries are normal.

Other:  Small amount of free pelvic fluid is likely physiologic.

Musculoskeletal: The bony structures are unremarkable. The hip and
pelvic musculature is unremarkable.
IMPRESSION: 1. 4.2 x 3.7 x 2.9 cm cervical mass involving the anterior aspect of
the cervix and also likely involving the anterior aspect of the
lower uterine segment. This extends into the vagina mainly on the
left side. No MR findings to suggest involvement of the bladder,
urethra rectum.
2. Borderline enlarged bilateral pelvic sidewall lymph nodes
worrisome for metastatic disease.

## 2024-03-31 IMAGING — US IR IMAGING GUIDED PORT INSERTION
1 series · 1 of 1 positions shown · non-contrast
Comparison: none

INDICATION: CERVICAL CANCER

[Series 1: ir imaging guided port insertion · 0.07mm/px · 1 of 1 slices shown]
[im 1/1]
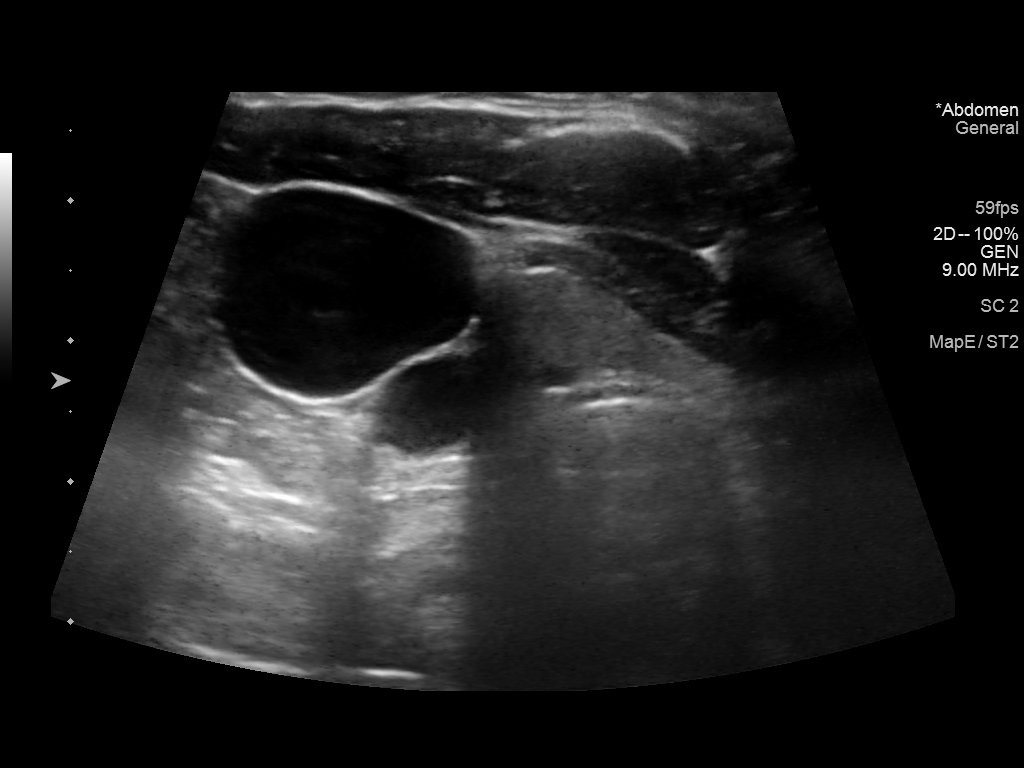

[1 of 1 positions shown; findings below may reference images not displayed]

EXAM:
Ultrasound-guided puncture of the right internal jugular vein

Placement of a right-sided chest port using fluoroscopic guidance

MEDICATIONS:
Per EMR

ANESTHESIA/SEDATION:
Moderate (conscious) sedation was employed during this procedure. A
total of Versed 2 mg and Fentanyl 100 mcg was administered
intravenously.

Moderate Sedation Time: 27 minutes. The patient's level of
consciousness and vital signs were monitored continuously by
radiology nursing throughout the procedure under my direct
supervision.

FLUOROSCOPY TIME:  Fluoroscopy Time: 0.6 minutes (4 mGy)

COMPLICATIONS:
None immediate.

PROCEDURE:
Informed written consent was obtained from the patient after a
thorough discussion of the procedural risks, benefits and
alternatives. All questions were addressed. Maximal Sterile Barrier
Technique was utilized including caps, mask, sterile gowns, sterile
gloves, sterile drape, hand hygiene and skin antiseptic. A timeout
was performed prior to the initiation of the procedure.

The patient was placed supine on the exam table. The right neck and
chest was prepped and draped in the standard sterile fashion. A
preliminary ultrasound of the right neck was performed and
demonstrates a patent right internal jugular vein. A permanent
ultrasound image was stored in the electronic medical record. The
overlying skin was anesthetized with 1% Lidocaine. Using ultrasound
guidance, access was obtained into the right internal jugular vein
using a 21 gauge micropuncture set. A wire was advanced into the
SVC, a short incision was made at the puncture site, and serial
dilatation performed. Next, in an ipsilateral infraclavicular
location, an incision was made at the site of the subcutaneous
reservoir. Blunt dissection was used to open a pocket to contain the
reservoir. A subcutaneous tunnel was then created from the port site
to the puncture site. A(n) 8 Fr single lumen catheter was advanced
through the tunnel. The catheter was attached to the port and this
was placed in the subcutaneous pocket. Under fluoroscopic guidance,
a peel away sheath was placed, and the catheter was trimmed to the
appropriate length and was advanced into the central veins. The
catheter length is 24 cm. The tip of the catheter lies near the
superior cavoatrial junction. The port flushes and aspirates
appropriately. The port was flushed and locked with heparinized
saline. The port pocket was closed in 2 layers using 3-0 and 4-0
Vicryl/absorbable suture. Dermabond was also applied to both
incisions. The patient tolerated the procedure well and was
transferred to recovery in stable condition.
IMPRESSION: Successful placement of a right-sided chest port via the right
internal jugular vein. The port is ready for immediate use.

## 2024-04-10 ENCOUNTER — Ambulatory Visit: Admitting: Physician Assistant

## 2024-05-31 ENCOUNTER — Inpatient Hospital Stay
